# Patient Record
Sex: Male | Born: 1935 | Race: White | Hispanic: No | Marital: Married | Smoking: Former smoker
Health system: Southern US, Community
[De-identification: ages and names within clinical notes are randomized; demographics above are authoritative.]

## PROBLEM LIST (undated history)

## (undated) DIAGNOSIS — D649 Anemia, unspecified: Secondary | ICD-10-CM

## (undated) DIAGNOSIS — Z9889 Other specified postprocedural states: Secondary | ICD-10-CM

## (undated) DIAGNOSIS — F29 Unspecified psychosis not due to a substance or known physiological condition: Secondary | ICD-10-CM

## (undated) DIAGNOSIS — N400 Enlarged prostate without lower urinary tract symptoms: Secondary | ICD-10-CM

## (undated) DIAGNOSIS — E785 Hyperlipidemia, unspecified: Secondary | ICD-10-CM

## (undated) DIAGNOSIS — Z79899 Other long term (current) drug therapy: Secondary | ICD-10-CM

## (undated) DIAGNOSIS — I4891 Unspecified atrial fibrillation: Secondary | ICD-10-CM

## (undated) DIAGNOSIS — Z950 Presence of cardiac pacemaker: Secondary | ICD-10-CM

## (undated) DIAGNOSIS — Z9089 Acquired absence of other organs: Secondary | ICD-10-CM

## (undated) DIAGNOSIS — I10 Essential (primary) hypertension: Secondary | ICD-10-CM

## (undated) DIAGNOSIS — G459 Transient cerebral ischemic attack, unspecified: Secondary | ICD-10-CM

## (undated) DIAGNOSIS — C61 Malignant neoplasm of prostate: Secondary | ICD-10-CM

## (undated) DIAGNOSIS — K5730 Diverticulosis of large intestine without perforation or abscess without bleeding: Secondary | ICD-10-CM

## (undated) DIAGNOSIS — K649 Unspecified hemorrhoids: Secondary | ICD-10-CM

## (undated) DIAGNOSIS — D126 Benign neoplasm of colon, unspecified: Secondary | ICD-10-CM

## (undated) DIAGNOSIS — M79609 Pain in unspecified limb: Secondary | ICD-10-CM

## (undated) DIAGNOSIS — D473 Essential (hemorrhagic) thrombocythemia: Secondary | ICD-10-CM

## (undated) HISTORY — DX: Unspecified hemorrhoids: K64.9

## (undated) HISTORY — DX: Presence of cardiac pacemaker: Z95.0

## (undated) HISTORY — DX: Pain in unspecified limb: M79.609

## (undated) HISTORY — DX: Diverticulosis of large intestine without perforation or abscess without bleeding: K57.30

## (undated) HISTORY — PX: OTHER SURGICAL HISTORY: SHX169

## (undated) HISTORY — DX: Essential (primary) hypertension: I10

## (undated) HISTORY — PX: TONSILLECTOMY: SUR1361

## (undated) HISTORY — PX: TONSILLECTOMY AND ADENOIDECTOMY: SHX28

## (undated) HISTORY — DX: Benign neoplasm of colon, unspecified: D12.6

## (undated) HISTORY — DX: Other specified postprocedural states: Z98.890

## (undated) HISTORY — DX: Acquired absence of other organs: Z90.89

## (undated) HISTORY — DX: Hyperlipidemia, unspecified: E78.5

## (undated) HISTORY — DX: Other long term (current) drug therapy: Z79.899

## (undated) HISTORY — DX: Unspecified psychosis not due to a substance or known physiological condition: F29

## (undated) HISTORY — DX: Unspecified atrial fibrillation: I48.91

## (undated) HISTORY — DX: Benign prostatic hyperplasia without lower urinary tract symptoms: N40.0

## (undated) HISTORY — DX: Malignant neoplasm of prostate: C61

## (undated) HISTORY — PX: PACEMAKER PLACEMENT: SHX43

---

## 1998-01-27 ENCOUNTER — Observation Stay (HOSPITAL_COMMUNITY): Admission: AD | Admit: 1998-01-27 | Discharge: 1998-01-28 | Payer: Self-pay | Admitting: Internal Medicine

## 2002-11-14 ENCOUNTER — Ambulatory Visit (HOSPITAL_COMMUNITY): Admission: RE | Admit: 2002-11-14 | Discharge: 2002-11-14 | Payer: Self-pay | Admitting: Cardiology

## 2004-04-27 ENCOUNTER — Ambulatory Visit: Payer: Self-pay | Admitting: *Deleted

## 2004-05-13 ENCOUNTER — Ambulatory Visit: Payer: Self-pay

## 2004-05-23 ENCOUNTER — Ambulatory Visit: Payer: Self-pay | Admitting: Cardiology

## 2004-06-21 ENCOUNTER — Ambulatory Visit: Payer: Self-pay | Admitting: *Deleted

## 2004-06-23 ENCOUNTER — Ambulatory Visit: Payer: Self-pay

## 2004-07-12 ENCOUNTER — Ambulatory Visit: Payer: Self-pay

## 2004-07-19 ENCOUNTER — Ambulatory Visit: Payer: Self-pay | Admitting: Internal Medicine

## 2004-08-16 ENCOUNTER — Ambulatory Visit: Payer: Self-pay | Admitting: *Deleted

## 2004-09-09 ENCOUNTER — Ambulatory Visit: Payer: Self-pay | Admitting: Internal Medicine

## 2004-09-14 ENCOUNTER — Ambulatory Visit: Payer: Self-pay | Admitting: Cardiology

## 2004-10-11 ENCOUNTER — Ambulatory Visit: Payer: Self-pay | Admitting: Internal Medicine

## 2004-10-12 ENCOUNTER — Ambulatory Visit: Payer: Self-pay | Admitting: Cardiology

## 2004-11-08 ENCOUNTER — Ambulatory Visit: Payer: Self-pay | Admitting: Cardiology

## 2004-12-08 ENCOUNTER — Ambulatory Visit: Payer: Self-pay | Admitting: Cardiology

## 2004-12-14 ENCOUNTER — Ambulatory Visit: Payer: Self-pay | Admitting: Gastroenterology

## 2004-12-27 ENCOUNTER — Ambulatory Visit: Payer: Self-pay | Admitting: Internal Medicine

## 2005-01-09 ENCOUNTER — Ambulatory Visit: Payer: Self-pay | Admitting: Internal Medicine

## 2005-01-31 ENCOUNTER — Ambulatory Visit: Payer: Self-pay | Admitting: Gastroenterology

## 2005-01-31 LAB — HM COLONOSCOPY: HM Colonoscopy: ABNORMAL

## 2005-02-01 ENCOUNTER — Ambulatory Visit: Payer: Self-pay | Admitting: Internal Medicine

## 2005-02-06 ENCOUNTER — Ambulatory Visit: Payer: Self-pay | Admitting: Cardiology

## 2005-02-17 ENCOUNTER — Ambulatory Visit: Payer: Self-pay | Admitting: Cardiology

## 2005-03-16 ENCOUNTER — Ambulatory Visit: Payer: Self-pay | Admitting: Internal Medicine

## 2005-03-20 ENCOUNTER — Ambulatory Visit: Payer: Self-pay | Admitting: Cardiology

## 2005-04-14 ENCOUNTER — Ambulatory Visit: Payer: Self-pay | Admitting: Internal Medicine

## 2005-04-17 ENCOUNTER — Ambulatory Visit: Payer: Self-pay | Admitting: Internal Medicine

## 2005-05-18 ENCOUNTER — Ambulatory Visit: Payer: Self-pay | Admitting: Internal Medicine

## 2005-05-29 ENCOUNTER — Ambulatory Visit: Payer: Self-pay | Admitting: Cardiology

## 2005-06-15 ENCOUNTER — Ambulatory Visit: Payer: Self-pay | Admitting: *Deleted

## 2005-06-21 ENCOUNTER — Ambulatory Visit: Payer: Self-pay | Admitting: Internal Medicine

## 2005-07-17 ENCOUNTER — Ambulatory Visit: Payer: Self-pay | Admitting: Cardiology

## 2005-07-20 ENCOUNTER — Ambulatory Visit: Payer: Self-pay | Admitting: Internal Medicine

## 2005-08-17 ENCOUNTER — Ambulatory Visit: Payer: Self-pay | Admitting: Internal Medicine

## 2005-08-18 ENCOUNTER — Ambulatory Visit: Payer: Self-pay | Admitting: Internal Medicine

## 2005-08-23 ENCOUNTER — Ambulatory Visit: Payer: Self-pay | Admitting: *Deleted

## 2005-08-25 ENCOUNTER — Ambulatory Visit: Payer: Self-pay | Admitting: *Deleted

## 2005-09-11 ENCOUNTER — Ambulatory Visit: Payer: Self-pay | Admitting: Internal Medicine

## 2005-09-13 ENCOUNTER — Ambulatory Visit: Payer: Self-pay | Admitting: Cardiology

## 2005-09-18 ENCOUNTER — Ambulatory Visit: Payer: Self-pay | Admitting: Internal Medicine

## 2005-10-09 ENCOUNTER — Ambulatory Visit: Payer: Self-pay | Admitting: Cardiology

## 2005-12-04 ENCOUNTER — Ambulatory Visit: Payer: Self-pay | Admitting: Cardiology

## 2005-12-12 ENCOUNTER — Ambulatory Visit: Payer: Self-pay | Admitting: Internal Medicine

## 2005-12-22 ENCOUNTER — Ambulatory Visit: Payer: Self-pay | Admitting: Internal Medicine

## 2006-01-01 ENCOUNTER — Ambulatory Visit: Payer: Self-pay | Admitting: Cardiology

## 2006-01-15 ENCOUNTER — Ambulatory Visit: Payer: Self-pay | Admitting: Cardiovascular Disease

## 2006-02-02 ENCOUNTER — Ambulatory Visit: Payer: Self-pay | Admitting: Internal Medicine

## 2006-02-12 ENCOUNTER — Ambulatory Visit: Payer: Self-pay | Admitting: Cardiology

## 2006-02-27 ENCOUNTER — Ambulatory Visit: Payer: Self-pay | Admitting: *Deleted

## 2006-03-12 ENCOUNTER — Ambulatory Visit: Payer: Self-pay | Admitting: Cardiology

## 2006-03-12 ENCOUNTER — Ambulatory Visit: Payer: Self-pay | Admitting: *Deleted

## 2006-04-09 ENCOUNTER — Ambulatory Visit: Payer: Self-pay | Admitting: Cardiovascular Disease

## 2006-05-07 ENCOUNTER — Ambulatory Visit: Payer: Self-pay | Admitting: Cardiology

## 2006-05-07 ENCOUNTER — Ambulatory Visit: Payer: Self-pay | Admitting: Internal Medicine

## 2006-06-04 ENCOUNTER — Ambulatory Visit: Payer: Self-pay | Admitting: Internal Medicine

## 2006-06-04 ENCOUNTER — Ambulatory Visit: Payer: Self-pay | Admitting: Cardiology

## 2006-06-19 DIAGNOSIS — Z950 Presence of cardiac pacemaker: Secondary | ICD-10-CM

## 2006-06-19 HISTORY — DX: Presence of cardiac pacemaker: Z95.0

## 2006-06-25 ENCOUNTER — Ambulatory Visit: Payer: Self-pay | Admitting: Cardiology

## 2006-07-02 ENCOUNTER — Ambulatory Visit: Payer: Self-pay | Admitting: Internal Medicine

## 2006-07-23 ENCOUNTER — Ambulatory Visit: Payer: Self-pay | Admitting: Cardiology

## 2006-07-30 ENCOUNTER — Ambulatory Visit: Payer: Self-pay | Admitting: Internal Medicine

## 2006-08-06 ENCOUNTER — Ambulatory Visit: Payer: Self-pay | Admitting: Internal Medicine

## 2006-08-27 ENCOUNTER — Ambulatory Visit: Payer: Self-pay | Admitting: Internal Medicine

## 2006-09-03 ENCOUNTER — Ambulatory Visit: Payer: Self-pay | Admitting: Cardiology

## 2006-09-20 ENCOUNTER — Ambulatory Visit: Payer: Self-pay | Admitting: Internal Medicine

## 2006-10-01 ENCOUNTER — Ambulatory Visit: Payer: Self-pay | Admitting: Cardiology

## 2006-10-16 ENCOUNTER — Ambulatory Visit: Payer: Self-pay | Admitting: *Deleted

## 2006-10-22 ENCOUNTER — Ambulatory Visit: Payer: Self-pay | Admitting: Internal Medicine

## 2006-10-22 ENCOUNTER — Ambulatory Visit: Payer: Self-pay | Admitting: Cardiovascular Disease

## 2006-10-22 ENCOUNTER — Ambulatory Visit: Payer: Self-pay | Admitting: *Deleted

## 2006-10-22 LAB — CONVERTED CEMR LAB
Alkaline Phosphatase: 52 units/L (ref 39–117)
Bilirubin, Direct: 0.2 mg/dL (ref 0.0–0.3)
Cholesterol: 143 mg/dL (ref 0–200)
HDL: 59.9 mg/dL (ref >39.0)
LDL Cholesterol: 69 mg/dL (ref 0–99)
Total Bilirubin: 1.6 mg/dL — ABNORMAL HIGH (ref 0.3–1.2)
Total CHOL/HDL Ratio: 2.4
Total Protein: 6.7 g/dL (ref 6.0–8.3)
Triglycerides: 72 mg/dL (ref 0–149)

## 2006-11-19 ENCOUNTER — Ambulatory Visit: Payer: Self-pay | Admitting: Internal Medicine

## 2006-12-17 ENCOUNTER — Ambulatory Visit: Payer: Self-pay | Admitting: Internal Medicine

## 2006-12-17 ENCOUNTER — Ambulatory Visit: Payer: Self-pay | Admitting: Cardiology

## 2006-12-31 ENCOUNTER — Ambulatory Visit: Payer: Self-pay | Admitting: Internal Medicine

## 2007-01-14 ENCOUNTER — Ambulatory Visit: Payer: Self-pay | Admitting: Internal Medicine

## 2007-01-14 ENCOUNTER — Ambulatory Visit: Payer: Self-pay | Admitting: Cardiology

## 2007-02-11 ENCOUNTER — Ambulatory Visit: Payer: Self-pay | Admitting: Internal Medicine

## 2007-02-11 ENCOUNTER — Ambulatory Visit: Payer: Self-pay | Admitting: Cardiology

## 2007-03-09 ENCOUNTER — Ambulatory Visit: Payer: Self-pay | Admitting: Internal Medicine

## 2007-03-11 ENCOUNTER — Ambulatory Visit: Payer: Self-pay | Admitting: Cardiovascular Disease

## 2007-04-01 ENCOUNTER — Ambulatory Visit: Payer: Self-pay | Admitting: Internal Medicine

## 2007-04-08 ENCOUNTER — Ambulatory Visit: Payer: Self-pay | Admitting: Internal Medicine

## 2007-04-22 ENCOUNTER — Ambulatory Visit: Payer: Self-pay | Admitting: Cardiology

## 2007-05-06 ENCOUNTER — Ambulatory Visit: Payer: Self-pay | Admitting: Internal Medicine

## 2007-05-06 LAB — CONVERTED CEMR LAB
ALT: 20 units/L (ref 0–53)
AST: 25 units/L (ref 0–37)
Alkaline Phosphatase: 55 units/L (ref 39–117)
BUN: 23 mg/dL (ref 6–23)
Basophils Relative: 0.1 % (ref 0.0–1.0)
CO2: 31 meq/L (ref 19–32)
Calcium: 9.3 mg/dL (ref 8.4–10.5)
Chloride: 105 meq/L (ref 96–112)
Eosinophils Absolute: 0.2 10*3/uL (ref 0.0–0.6)
Eosinophils Relative: 3.6 % (ref 0.0–5.0)
GFR calc Af Amer: 93 mL/min
GFR calc non Af Amer: 77 mL/min
Lymphocytes Relative: 22.4 % (ref 12.0–46.0)
Monocytes Relative: 11.2 % — ABNORMAL HIGH (ref 3.0–11.0)
Neutro Abs: 4.1 10*3/uL (ref 1.4–7.7)
Platelets: 207 10*3/uL (ref 150–400)
Prothrombin Time: 15.5 s — ABNORMAL HIGH (ref 10.9–13.3)
RBC: 4.33 M/uL (ref 4.22–5.81)
WBC: 6.4 10*3/uL (ref 4.5–10.5)

## 2007-05-07 ENCOUNTER — Ambulatory Visit: Payer: Self-pay | Admitting: Internal Medicine

## 2007-05-07 ENCOUNTER — Ambulatory Visit (HOSPITAL_COMMUNITY): Admission: RE | Admit: 2007-05-07 | Discharge: 2007-05-07 | Payer: Self-pay | Admitting: Internal Medicine

## 2007-05-22 ENCOUNTER — Ambulatory Visit: Payer: Self-pay | Admitting: Internal Medicine

## 2007-05-22 ENCOUNTER — Ambulatory Visit: Payer: Self-pay

## 2007-06-05 ENCOUNTER — Ambulatory Visit: Payer: Self-pay | Admitting: Cardiology

## 2007-06-25 ENCOUNTER — Ambulatory Visit: Payer: Self-pay | Admitting: Cardiology

## 2007-07-23 ENCOUNTER — Ambulatory Visit: Payer: Self-pay | Admitting: Cardiology

## 2007-08-12 ENCOUNTER — Ambulatory Visit: Payer: Self-pay | Admitting: Internal Medicine

## 2007-08-20 ENCOUNTER — Ambulatory Visit: Payer: Self-pay | Admitting: Internal Medicine

## 2007-08-26 ENCOUNTER — Encounter: Payer: Self-pay | Admitting: Internal Medicine

## 2007-09-23 ENCOUNTER — Ambulatory Visit: Payer: Self-pay | Admitting: Internal Medicine

## 2007-10-21 ENCOUNTER — Encounter: Payer: Self-pay | Admitting: *Deleted

## 2007-10-21 ENCOUNTER — Ambulatory Visit: Payer: Self-pay | Admitting: Cardiovascular Disease

## 2007-10-21 DIAGNOSIS — D126 Benign neoplasm of colon, unspecified: Secondary | ICD-10-CM

## 2007-10-21 DIAGNOSIS — K649 Unspecified hemorrhoids: Secondary | ICD-10-CM | POA: Insufficient documentation

## 2007-10-21 DIAGNOSIS — I498 Other specified cardiac arrhythmias: Secondary | ICD-10-CM

## 2007-10-21 DIAGNOSIS — E785 Hyperlipidemia, unspecified: Secondary | ICD-10-CM

## 2007-10-21 DIAGNOSIS — K5730 Diverticulosis of large intestine without perforation or abscess without bleeding: Secondary | ICD-10-CM | POA: Insufficient documentation

## 2007-10-21 DIAGNOSIS — I10 Essential (primary) hypertension: Secondary | ICD-10-CM | POA: Insufficient documentation

## 2007-10-21 DIAGNOSIS — Z9089 Acquired absence of other organs: Secondary | ICD-10-CM

## 2007-10-21 DIAGNOSIS — Z87898 Personal history of other specified conditions: Secondary | ICD-10-CM

## 2007-11-18 ENCOUNTER — Ambulatory Visit: Payer: Self-pay | Admitting: Cardiology

## 2007-12-30 ENCOUNTER — Ambulatory Visit: Payer: Self-pay | Admitting: Cardiology

## 2008-01-15 ENCOUNTER — Ambulatory Visit: Payer: Self-pay | Admitting: Internal Medicine

## 2008-01-15 DIAGNOSIS — M79609 Pain in unspecified limb: Secondary | ICD-10-CM

## 2008-01-15 DIAGNOSIS — R7989 Other specified abnormal findings of blood chemistry: Secondary | ICD-10-CM | POA: Insufficient documentation

## 2008-01-15 LAB — CONVERTED CEMR LAB
BUN: 25 mg/dL — ABNORMAL HIGH (ref 6–23)
CO2: 30 meq/L (ref 19–32)
Calcium: 9 mg/dL (ref 8.4–10.5)
Chloride: 106 meq/L (ref 96–112)
Creatinine, Ser: 1 mg/dL (ref 0.4–1.5)
GFR calc Af Amer: 93 mL/min
GFR calc non Af Amer: 77 mL/min
Glucose, Bld: 106 mg/dL — ABNORMAL HIGH (ref 70–99)
Hgb A1c MFr Bld: 6 % (ref 4.6–6.0)
Potassium: 4.5 meq/L (ref 3.5–5.1)
Sed Rate: 8 mm/hr (ref 0–16)
Sodium: 141 meq/L (ref 135–145)
Total CK: 133 units/L (ref 7–195)

## 2008-01-17 ENCOUNTER — Encounter: Payer: Self-pay | Admitting: Internal Medicine

## 2008-01-27 ENCOUNTER — Ambulatory Visit: Payer: Self-pay | Admitting: Cardiology

## 2008-02-25 ENCOUNTER — Ambulatory Visit: Payer: Self-pay | Admitting: Cardiology

## 2008-03-23 ENCOUNTER — Ambulatory Visit: Payer: Self-pay | Admitting: Internal Medicine

## 2008-04-21 ENCOUNTER — Ambulatory Visit: Payer: Self-pay | Admitting: Internal Medicine

## 2008-05-21 ENCOUNTER — Ambulatory Visit: Payer: Self-pay | Admitting: Cardiology

## 2008-05-25 ENCOUNTER — Ambulatory Visit: Payer: Self-pay | Admitting: Internal Medicine

## 2008-06-22 ENCOUNTER — Ambulatory Visit: Payer: Self-pay | Admitting: Internal Medicine

## 2008-07-07 ENCOUNTER — Ambulatory Visit: Payer: Self-pay | Admitting: Internal Medicine

## 2008-07-09 DIAGNOSIS — I4891 Unspecified atrial fibrillation: Secondary | ICD-10-CM

## 2008-07-27 ENCOUNTER — Ambulatory Visit: Payer: Self-pay | Admitting: Cardiology

## 2008-08-24 ENCOUNTER — Ambulatory Visit: Payer: Self-pay | Admitting: Cardiology

## 2008-08-25 ENCOUNTER — Ambulatory Visit: Payer: Self-pay | Admitting: Cardiology

## 2008-08-25 LAB — CONVERTED CEMR LAB
ALT: 22 units/L (ref 0–53)
Albumin: 4.1 g/dL (ref 3.5–5.2)
Cholesterol: 156 mg/dL (ref 0–200)
HDL: 59.2 mg/dL (ref >39.0)
Total Protein: 6.6 g/dL (ref 6.0–8.3)
Triglycerides: 43 mg/dL (ref 0–149)
VLDL: 9 mg/dL (ref 0–40)

## 2008-09-02 ENCOUNTER — Encounter: Payer: Self-pay | Admitting: Internal Medicine

## 2008-09-21 ENCOUNTER — Ambulatory Visit: Payer: Self-pay | Admitting: Cardiology

## 2008-09-25 ENCOUNTER — Encounter (INDEPENDENT_AMBULATORY_CARE_PROVIDER_SITE_OTHER): Payer: Self-pay

## 2008-10-05 ENCOUNTER — Telehealth (INDEPENDENT_AMBULATORY_CARE_PROVIDER_SITE_OTHER): Payer: Self-pay | Admitting: *Deleted

## 2008-10-12 ENCOUNTER — Ambulatory Visit: Payer: Self-pay | Admitting: Internal Medicine

## 2008-10-19 ENCOUNTER — Ambulatory Visit: Payer: Self-pay | Admitting: Internal Medicine

## 2008-10-20 ENCOUNTER — Telehealth: Payer: Self-pay | Admitting: Internal Medicine

## 2008-10-21 LAB — CONVERTED CEMR LAB
ALT: 14 U/L
AST: 27 U/L
Albumin: 4.1 g/dL
Alkaline Phosphatase: 55 U/L
Bilirubin, Direct: 0.3 mg/dL
Cholesterol: 180 mg/dL
HDL: 57.8 mg/dL
LDL Cholesterol: 111 mg/dL — ABNORMAL HIGH
Total Bilirubin: 1.5 mg/dL — ABNORMAL HIGH
Total CHOL/HDL Ratio: 3
Total Protein: 6.6 g/dL
Triglycerides: 57 mg/dL
VLDL: 11.4 mg/dL

## 2008-11-04 DIAGNOSIS — Z950 Presence of cardiac pacemaker: Secondary | ICD-10-CM | POA: Insufficient documentation

## 2008-11-17 ENCOUNTER — Ambulatory Visit: Payer: Self-pay | Admitting: Internal Medicine

## 2008-11-17 ENCOUNTER — Ambulatory Visit: Payer: Self-pay | Admitting: Cardiovascular Disease

## 2008-11-17 ENCOUNTER — Encounter: Payer: Self-pay | Admitting: *Deleted

## 2008-12-10 ENCOUNTER — Telehealth: Payer: Self-pay | Admitting: Internal Medicine

## 2008-12-15 ENCOUNTER — Ambulatory Visit: Payer: Self-pay | Admitting: Cardiology

## 2008-12-15 ENCOUNTER — Ambulatory Visit: Payer: Self-pay | Admitting: Internal Medicine

## 2008-12-15 ENCOUNTER — Encounter: Payer: Self-pay | Admitting: Internal Medicine

## 2008-12-15 ENCOUNTER — Encounter (INDEPENDENT_AMBULATORY_CARE_PROVIDER_SITE_OTHER): Payer: Self-pay | Admitting: Cardiology

## 2008-12-15 LAB — CONVERTED CEMR LAB: Prothrombin Time: 12.9 s

## 2008-12-17 ENCOUNTER — Encounter: Payer: Self-pay | Admitting: Internal Medicine

## 2008-12-17 LAB — CONVERTED CEMR LAB
ALT: 15 units/L (ref 0–53)
HDL: 64.2 mg/dL (ref >39.00)
Total Bilirubin: 1.8 mg/dL — ABNORMAL HIGH (ref 0.3–1.2)
Total Protein: 6.7 g/dL (ref 6.0–8.3)
VLDL: 11.8 mg/dL (ref 0.0–40.0)

## 2008-12-22 ENCOUNTER — Telehealth: Payer: Self-pay | Admitting: Internal Medicine

## 2008-12-23 ENCOUNTER — Encounter: Payer: Self-pay | Admitting: *Deleted

## 2008-12-25 ENCOUNTER — Ambulatory Visit: Payer: Self-pay | Admitting: Cardiology

## 2008-12-25 LAB — CONVERTED CEMR LAB
POC INR: 1.9
Prothrombin Time: 17 s

## 2009-01-11 ENCOUNTER — Ambulatory Visit: Payer: Self-pay | Admitting: Cardiology

## 2009-02-08 ENCOUNTER — Ambulatory Visit: Payer: Self-pay | Admitting: Cardiology

## 2009-02-08 LAB — CONVERTED CEMR LAB: POC INR: 2.9

## 2009-03-08 ENCOUNTER — Ambulatory Visit: Payer: Self-pay | Admitting: Cardiology

## 2009-03-08 LAB — CONVERTED CEMR LAB: POC INR: 3

## 2009-03-15 ENCOUNTER — Ambulatory Visit: Payer: Self-pay | Admitting: Internal Medicine

## 2009-04-05 ENCOUNTER — Ambulatory Visit: Payer: Self-pay | Admitting: Internal Medicine

## 2009-05-03 ENCOUNTER — Ambulatory Visit: Payer: Self-pay | Admitting: Cardiology

## 2009-05-31 ENCOUNTER — Ambulatory Visit: Payer: Self-pay | Admitting: Cardiology

## 2009-05-31 ENCOUNTER — Encounter (INDEPENDENT_AMBULATORY_CARE_PROVIDER_SITE_OTHER): Payer: Self-pay | Admitting: Cardiology

## 2009-06-14 ENCOUNTER — Ambulatory Visit: Payer: Self-pay | Admitting: Internal Medicine

## 2009-07-05 ENCOUNTER — Ambulatory Visit: Payer: Self-pay | Admitting: Cardiology

## 2009-08-09 ENCOUNTER — Ambulatory Visit: Payer: Self-pay | Admitting: Internal Medicine

## 2009-08-09 DIAGNOSIS — J069 Acute upper respiratory infection, unspecified: Secondary | ICD-10-CM | POA: Insufficient documentation

## 2009-08-12 ENCOUNTER — Ambulatory Visit: Payer: Self-pay | Admitting: Cardiology

## 2009-09-13 ENCOUNTER — Ambulatory Visit: Payer: Self-pay | Admitting: Cardiovascular Disease

## 2009-09-13 ENCOUNTER — Ambulatory Visit: Payer: Self-pay | Admitting: Internal Medicine

## 2009-09-13 LAB — CONVERTED CEMR LAB: POC INR: 2.7

## 2009-09-14 ENCOUNTER — Encounter: Payer: Self-pay | Admitting: Internal Medicine

## 2009-10-13 ENCOUNTER — Ambulatory Visit: Payer: Self-pay | Admitting: Internal Medicine

## 2009-10-13 LAB — CONVERTED CEMR LAB: POC INR: 3.3

## 2009-11-09 ENCOUNTER — Ambulatory Visit: Payer: Self-pay | Admitting: Cardiology

## 2009-11-09 LAB — CONVERTED CEMR LAB: POC INR: 3.2

## 2009-11-23 ENCOUNTER — Ambulatory Visit: Payer: Self-pay | Admitting: Cardiovascular Disease

## 2009-12-09 ENCOUNTER — Ambulatory Visit: Payer: Self-pay | Admitting: Cardiology

## 2009-12-09 LAB — CONVERTED CEMR LAB: POC INR: 2.4

## 2009-12-14 ENCOUNTER — Ambulatory Visit: Payer: Self-pay | Admitting: Internal Medicine

## 2009-12-17 ENCOUNTER — Encounter: Payer: Self-pay | Admitting: Internal Medicine

## 2009-12-22 ENCOUNTER — Ambulatory Visit: Payer: Self-pay | Admitting: Internal Medicine

## 2009-12-22 LAB — CONVERTED CEMR LAB: POC INR: 1.1

## 2009-12-23 ENCOUNTER — Encounter: Payer: Self-pay | Admitting: Internal Medicine

## 2009-12-24 ENCOUNTER — Encounter: Payer: Self-pay | Admitting: Internal Medicine

## 2009-12-30 ENCOUNTER — Ambulatory Visit: Payer: Self-pay | Admitting: Cardiology

## 2009-12-31 ENCOUNTER — Ambulatory Visit: Admission: RE | Admit: 2009-12-31 | Discharge: 2010-03-31 | Payer: Self-pay | Admitting: Radiation Oncology

## 2010-01-03 ENCOUNTER — Encounter: Payer: Self-pay | Admitting: Internal Medicine

## 2010-01-10 ENCOUNTER — Ambulatory Visit: Payer: Self-pay | Admitting: Cardiology

## 2010-01-11 ENCOUNTER — Encounter: Payer: Self-pay | Admitting: Internal Medicine

## 2010-01-17 ENCOUNTER — Encounter (INDEPENDENT_AMBULATORY_CARE_PROVIDER_SITE_OTHER): Payer: Self-pay | Admitting: *Deleted

## 2010-01-31 ENCOUNTER — Ambulatory Visit: Payer: Self-pay | Admitting: Cardiology

## 2010-02-01 ENCOUNTER — Telehealth: Payer: Self-pay | Admitting: Internal Medicine

## 2010-02-11 ENCOUNTER — Encounter: Payer: Self-pay | Admitting: Internal Medicine

## 2010-02-22 ENCOUNTER — Ambulatory Visit: Payer: Self-pay | Admitting: Internal Medicine

## 2010-02-22 LAB — CONVERTED CEMR LAB: POC INR: 2.4

## 2010-02-24 ENCOUNTER — Ambulatory Visit: Payer: Self-pay | Admitting: Internal Medicine

## 2010-02-24 DIAGNOSIS — F0390 Unspecified dementia without behavioral disturbance: Secondary | ICD-10-CM | POA: Insufficient documentation

## 2010-02-24 LAB — CONVERTED CEMR LAB
Chloride: 104 meq/L (ref 96–112)
Creatinine, Ser: 1 mg/dL (ref 0.4–1.5)
Folate: 7.7 ng/mL
GFR calc non Af Amer: 73.11 mL/min (ref >60)
Potassium: 4.5 meq/L (ref 3.5–5.1)
TSH: 2.1 microintl units/mL (ref 0.35–5.50)
Vitamin B-12: 478 pg/mL (ref 211–911)

## 2010-03-03 ENCOUNTER — Ambulatory Visit: Payer: Self-pay | Admitting: Cardiology

## 2010-03-07 ENCOUNTER — Ambulatory Visit: Payer: Self-pay | Admitting: Cardiovascular Disease

## 2010-03-07 ENCOUNTER — Telehealth: Payer: Self-pay | Admitting: Internal Medicine

## 2010-03-07 LAB — CONVERTED CEMR LAB: POC INR: 2.9

## 2010-03-15 ENCOUNTER — Ambulatory Visit: Payer: Self-pay | Admitting: Internal Medicine

## 2010-03-18 ENCOUNTER — Encounter (INDEPENDENT_AMBULATORY_CARE_PROVIDER_SITE_OTHER): Payer: Self-pay | Admitting: *Deleted

## 2010-04-01 ENCOUNTER — Ambulatory Visit: Admission: RE | Admit: 2010-04-01 | Discharge: 2010-04-25 | Payer: Self-pay | Admitting: Radiation Oncology

## 2010-04-04 ENCOUNTER — Ambulatory Visit: Payer: Self-pay | Admitting: Cardiology

## 2010-04-04 LAB — CONVERTED CEMR LAB: POC INR: 2.7

## 2010-04-07 ENCOUNTER — Encounter: Payer: Self-pay | Admitting: Internal Medicine

## 2010-05-06 ENCOUNTER — Ambulatory Visit: Payer: Self-pay | Admitting: Cardiovascular Disease

## 2010-05-15 ENCOUNTER — Encounter: Payer: Self-pay | Admitting: Internal Medicine

## 2010-06-08 ENCOUNTER — Ambulatory Visit: Payer: Self-pay | Admitting: Internal Medicine

## 2010-06-08 LAB — CONVERTED CEMR LAB: POC INR: 2.2

## 2010-06-14 ENCOUNTER — Encounter: Payer: Self-pay | Admitting: Internal Medicine

## 2010-06-22 ENCOUNTER — Ambulatory Visit: Payer: Self-pay | Admitting: Internal Medicine

## 2010-06-27 ENCOUNTER — Ambulatory Visit
Admission: RE | Admit: 2010-06-27 | Discharge: 2010-06-27 | Payer: Self-pay | Source: Home / Self Care | Attending: Internal Medicine | Admitting: Internal Medicine

## 2010-06-27 ENCOUNTER — Encounter: Payer: Self-pay | Admitting: Internal Medicine

## 2010-06-27 DIAGNOSIS — C61 Malignant neoplasm of prostate: Secondary | ICD-10-CM | POA: Insufficient documentation

## 2010-07-05 ENCOUNTER — Encounter: Payer: Self-pay | Admitting: Internal Medicine

## 2010-07-05 ENCOUNTER — Ambulatory Visit: Admission: RE | Admit: 2010-07-05 | Discharge: 2010-07-05 | Payer: Self-pay | Source: Home / Self Care

## 2010-07-05 ENCOUNTER — Ambulatory Visit
Admission: RE | Admit: 2010-07-05 | Discharge: 2010-07-05 | Payer: Self-pay | Source: Home / Self Care | Attending: Internal Medicine | Admitting: Internal Medicine

## 2010-07-19 NOTE — Letter (Signed)
[facility]: [facility]   Imported By: Sherian Rein 09/21/2009 10:00:59  _____________________________________________________________________  External Attachment:    Type:   Image     Comment:   External Document

## 2010-07-19 NOTE — Letter (Signed)
Summary: [facility]   Imported By: Sherian Rein 02/18/2010 08:04:30  _____________________________________________________________________  External Attachment:    Type:   Image     Comment:   External Document

## 2010-07-19 NOTE — Medication Information (Signed)
Summary: rov/ewj  Anticoagulant Therapy  Managed by: Cloyde Reams, RN, BSN Referring MD: Rollene Rotunda MD PCP: Link Snuffer MD: Tenny Craw MD, Gunnar Fusi Indication 1: Atrial Fibrillation (ICD-427.31) Lab Used: [facility] Site: [facility] INR POC 2.7 INR RANGE 2 - 3  Dietary changes: no    Health status changes: no    Bleeding/hemorrhagic complications: no    Recent/future hospitalizations: no    Any changes in medication regimen? no    Recent/future dental: no  Any missed doses?: no       Is patient compliant with meds? yes       Allergies (verified): No Known Drug Allergies  Anticoagulation Management History:      The patient is taking warfarin and comes in today for a routine follow up visit.  Positive risk factors for bleeding include an age of 75 years or older.  The bleeding index is 'intermediate risk'.  Positive CHADS2 values include History of HTN and Age > 4 years old.  The start date was 10/08/1997.  His last INR was 1.6 RATIO.  Anticoagulation responsible provider: Tenny Craw MD, Gunnar Fusi.  INR POC: 2.7.  Cuvette Lot#: [id number].  Exp: 10/2010.    Anticoagulation Management Assessment/Plan:      The patient's current anticoagulation dose is Coumadin 5 mg tabs: Take as directed by coumadin clinic..  The target INR is 2 - 3.  The next INR is due 10/13/2009.  Anticoagulation instructions were given to patient.  Results were reviewed/authorized by Cloyde Reams, RN, BSN.  He was notified by Cloyde Reams RN.         Prior Anticoagulation Instructions: INR 2.2  Continue on same dosage 1 tablet daily.  Recheck in 4 weeks.    Current Anticoagulation Instructions: INR 2.7  Continue on same dosage 5mg  daily.  Recheck in 4 weeks.

## 2010-07-19 NOTE — Cardiovascular Report (Signed)
Summary: Certified Letter Signed - Other  Certified Letter Signed - Other   Imported By: Debby Freiberg 04/26/2010 12:50:58  _____________________________________________________________________  External Attachment:    Type:   Image     Comment:   External Document

## 2010-07-19 NOTE — Cardiovascular Report (Signed)
Summary: TTM   TTM   Imported By: Roderic Ovens 07/01/2009 10:51:49  _____________________________________________________________________  External Attachment:    Type:   Image     Comment:   External Document

## 2010-07-19 NOTE — Medication Information (Signed)
Summary: rov/cs  Anticoagulant Therapy  Managed by: Eda Keys, PharmD Referring MD: Rollene Rotunda MD PCP: Link Snuffer MD: Nahser Indication 1: Atrial Fibrillation (ICD-427.31) Lab Used: [facility] Site: [facility] INR POC 2.1 INR RANGE 2 - 3  Dietary changes: no    Health status changes: no    Bleeding/hemorrhagic complications: no    Recent/future hospitalizations: no    Any changes in medication regimen? no    Recent/future dental: no  Any missed doses?: no       Is patient compliant with meds? yes       Allergies: No Known Drug Allergies  Anticoagulation Management History:      The patient is taking warfarin and comes in today for a routine follow up visit.  Positive risk factors for bleeding include an age of 75 years or older.  The bleeding index is 'intermediate risk'.  Positive CHADS2 values include History of HTN and Age > 29 years old.  The start date was 10/08/1997.  His last INR was 1.6 RATIO.  Anticoagulation responsible Kemarion Abbey: Nahser.  INR POC: 2.1.  Cuvette Lot#: [id number].  Exp: 05/2011.    Anticoagulation Management Assessment/Plan:      The patient's current anticoagulation dose is Coumadin 5 mg tabs: Take as directed by coumadin clinic..  The target INR is 2 - 3.  The next INR is due 06/03/2010.  Anticoagulation instructions were given to patient.  Results were reviewed/authorized by Eda Keys, PharmD.  He was notified by Eda Keys.         Prior Anticoagulation Instructions: INR 2.7  Continue Coumadin as scheduled:  1 tablet every day of the week.  Return to clinic in 4 weeks.   Current Anticoagulation Instructions: INR 2.1  Continue taking 1 tablet every day.  Return to clinic in 4 weeks.

## 2010-07-19 NOTE — Letter (Signed)
Summary: [facility]  [facility]   Imported By: Sherian Rein 05/20/2010 08:02:54  _____________________________________________________________________  External Attachment:    Type:   Image     Comment:   External Document

## 2010-07-19 NOTE — Medication Information (Signed)
Summary: rov/ewj  Anticoagulant Therapy  Managed by: Cloyde Reams, RN, BSN Referring MD: Rollene Rotunda MD PCP: Link Snuffer MD: Shirlee Latch MD, Dalton Indication 1: Atrial Fibrillation (ICD-427.31) Lab Used: [facility] Site: [facility] INR POC 1.2 INR RANGE 2 - 3  Dietary changes: no    Health status changes: no    Bleeding/hemorrhagic complications: no    Recent/future hospitalizations: no    Any changes in medication regimen? no    Recent/future dental: no  Any missed doses?: yes     Details: Off coumadin for a procedure, resumed coumadin 1 week ago.    Is patient compliant with meds? yes       Allergies: No Known Drug Allergies  Anticoagulation Management History:      The patient is taking warfarin and comes in today for a routine follow up visit.  Positive risk factors for bleeding include an age of 75 years or older.  The bleeding index is 'intermediate risk'.  Positive CHADS2 values include History of HTN and Age > 33 years old.  The start date was 10/08/1997.  His last INR was 1.6 RATIO.  Anticoagulation responsible provider: Shirlee Latch MD, Dalton.  INR POC: 1.2.  Cuvette Lot#: [id number].  Exp: 02/2011.    Anticoagulation Management Assessment/Plan:      The patient's current anticoagulation dose is Coumadin 5 mg tabs: Take as directed by coumadin clinic..  The target INR is 2 - 3.  The next INR is due 01/06/2010.  Anticoagulation instructions were given to patient.  Results were reviewed/authorized by Cloyde Reams, RN, BSN.  He was notified by Cloyde Reams RN.         Prior Anticoagulation Instructions: INR 1.1   Resume coumadin per Dr instructions after procedure 5mg  daily.  Recheck in 1 week.    Current Anticoagulation Instructions: INR 1.2  Take 1.5 tablets x 2 doses, then resume same doage 1 tablet daily. Recheck in 1 week.

## 2010-07-19 NOTE — Assessment & Plan Note (Signed)
Summary: SORE THROAT/ACHEY/NO FEVER/CD   Vital Signs:  Patient profile:   77 year old male Height:      77 inches Weight:      200 pounds BMI:     23.80 O2 Sat:      98 % on Room air Temp:     99.9 degrees F oral Pulse rate:   75 / minute BP sitting:   122 / 70  (left arm) Cuff size:   large  Vitals Entered By: Bill Salinas CMA (August 09, 2009 10:44 AM)  O2 Flow:  Room air CC: pt here with complaint of weakness, fatigue, sore throat coughing, sneezing with fever x 3 days/ ab   Primary Care Provider:  Koby Pickup  CC:  pt here with complaint of weakness, fatigue, sore throat coughing, and sneezing with fever x 3 days/ ab.  History of Present Illness: Patient presents for sore throat and fatigue. He has not been seen since July '09. IN the interval he has followed closely with coag clinic; he has folowed with Dr. Graciela Husbands for management of his pacemaker - having had redo procedure; he has followed with Dr. Annabell Howells with las vist in Feb '10 at which time he was stale with an age related increase in PSA. Last lipid profile from June '10 was great with good control.   For the first time he has been troubled by sneezing, weakness and loss of energy over the past 6 days. He may have contracted an acute illness from a grand-daughter. He denies documented fever at home but has felt feverish. He is getting a lot of mucus drainage. He is mildly short of breath. NO N/V,  has had loose stools.    Current Medications (verified): 1)  Atenolol 50 Mg  Tabs (Atenolol) .... Take Once Daily 2)  Zocor 5 Mg Tabs (Simvastatin) .... Take 1 Tablet By Mouth 2 Days Per Week 3)  Coumadin 5 Mg Tabs (Warfarin Sodium) .... Take As Directed By Coumadin Clinic.  Allergies (verified): No Known Drug Allergies  Past History:  Past Medical History: Last updated: 07/09/2008 BRADYCARDIA (ICD-427.89) HYPERTENSION (ICD-401.9) Hx of POLYP, COLON (ICD-211.3) BENIGN PROSTATIC HYPERTROPHY, HX OF (ICD-V13.8) HYPERLIPIDEMIA  (ICD-272.4) ATRIAL FIBRILLATION, CHRONIC, HX OF (ICD-V12.59) Hx of HEMORRHOIDS (ICD-455.6) Hx of DIVERTICULOSIS, COLON (ICD-562.10) Previously implanted pacemaker Guidant      Past Surgical History: Last updated: 10/21/2007 PACEMAKER, PERMANENT (ICD-V45.01) * Hx of VEIN STRIPPING LASER PROCEDURE TONSILLECTOMY AND ADENOIDECTOMY, HX OF (ICD-V45.79)  Family History: Last updated: 07/09/2008 Negative FH of Diabetes, Hypertension, or Coronary Artery Disease  Social History: [place] - '45 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  Tobacco Use - No.   Review of Systems       The patient complains of fever and dyspnea on exertion.  The patient denies anorexia, weight loss, weight gain, hoarseness, chest pain, syncope, prolonged cough, abdominal pain, muscle weakness, difficulty walking, abnormal bleeding, and enlarged lymph nodes.    Physical Exam  General:  alert, well-developed, and well-nourished.   Head:  normocephalic and atraumatic.  No marked sinus tenderness Eyes:  vision grossly intact, pupils equal, and pupils round.   Ears:  R ear normal and L ear normal.   Nose:  no external deformity and no nasal discharge.   Mouth:  Oral mucosa and oropharynx without lesions or  exudates.  Teeth in good repair. Neck:  full ROM.   Lungs:  normal respiratory effort, normal breath sounds, no crackles, and no wheezes.   Heart:  normal rate and regular rhythm.   Abdomen:  normal bowel sounds.   Msk:  normal ROM, no joint tenderness, no joint swelling, and no joint warmth.   Neurologic:  alert & oriented X3, cranial nerves II-XII intact, strength normal in all extremities, sensation intact to light touch, and gait normal.   Skin:  turgor normal and color normal.   Cervical Nodes:  no anterior cervical adenopathy and no  posterior cervical adenopathy.   Psych:  Oriented X3, normally interactive, and good eye contact.  Poor memory.   Impression & Recommendations:  Problem # 1:  URI (ICD-465.9) Patient with a simple URI without evidence of pneumonia.   Plan - azithromycin 500mg  once daily x 3          supportive care  Complete Medication List: 1)  Atenolol 50 Mg Tabs (Atenolol) .... Take once daily 2)  Zocor 5 Mg Tabs (Simvastatin) .... Take 1 tablet by mouth 2 days per week 3)  Coumadin 5 Mg Tabs (Warfarin sodium) .... Take as directed by coumadin clinic. 4)  Azithromycin 500 Mg Tabs (Azithromycin) .Marland Kitchen.. 1 by mouth once daily x 3 days  Patient Instructions: 1)  Upper respiratory infection with fever:  Exam reveals clear lungs, no heart related changes. plan - azithromycin (antibiotic) for 3 days (provides 10 days of antibiotic coverage); take over the counter loratadine once a day for the drainage; take tylenol 500mg  two tablets three times a day for fever and aches; drink a lot of fluids; vitamin C 1000mg  daily. Prescriptions: AZITHROMYCIN 500 MG TABS (AZITHROMYCIN) 1 by mouth once daily x 3 days  #3 x 0   Entered and Authorized by:   Jacques Navy MD   Signed by:   Lamar Sprinkles, CMA on 08/09/2009   Method used:   Electronically to        [facility]  [phone]* (retail)       [address]       [place], [place]  [address]       Ph: [phone] or [phone]       Fax: [phone]   RxID:   [id number]      Immunization History:  Zostavax History:    Zostavax # 1:  zostavax (07/21/1999)  Not Administered:    Influenza Vaccine not given due to: declined

## 2010-07-19 NOTE — Medication Information (Signed)
Summary: rov/tm  Anticoagulant Therapy  Managed by: Weston Brass, PharmD Referring MD: Rollene Rotunda MD PCP: Link Snuffer MD: Myrtis Ser MD, Tinnie Gens Indication 1: Atrial Fibrillation (ICD-427.31) Lab Used: [facility] Site: [facility] INR POC 2.1 INR RANGE 2 - 3  Dietary changes: no    Health status changes: no     Recent/future hospitalizations: yes       Details: Will be having prostate biopsy on Aug. 26; need to come off Coumadin Aug. 21.  Will send Dr. Graciela Husbands a note for clearance.   Any changes in medication regimen? no    Recent/future dental: no  Any missed doses?: no       Is patient compliant with meds? yes       Allergies: No Known Drug Allergies  Anticoagulation Management History:      The patient is taking warfarin and comes in today for a routine follow up visit.  Positive risk factors for bleeding include an age of 75 years or older.  The bleeding index is 'intermediate risk'.  Positive CHADS2 values include History of HTN and Age > 54 years old.  The start date was 10/08/1997.  His last INR was 1.6 RATIO.  Anticoagulation responsible Kammie Scioli: Myrtis Ser MD, Tinnie Gens.  INR POC: 2.1.  Cuvette Lot#: [id number].  Exp: 03/2011.    Anticoagulation Management Assessment/Plan:      The patient's current anticoagulation dose is Coumadin 5 mg tabs: Take as directed by coumadin clinic..  The target INR is 2 - 3.  The next INR is due 02/18/2010.  Anticoagulation instructions were given to patient.  Results were reviewed/authorized by Weston Brass, PharmD.  He was notified by Liana Gerold, PharmD Candidate.         Prior Anticoagulation Instructions: INR 2.2 Continue 1 pill everyday.  Recheck in 3 weeks.   Current Anticoagulation Instructions: INR 2.1  Continue same dose of 1 tablet daily.  Will f/u with directions for procedure once cleared by Dr. Graciela Husbands.

## 2010-07-19 NOTE — Cardiovascular Report (Signed)
Summary: TTM   TTM   Imported By: Roderic Ovens 04/15/2010 15:18:10  _____________________________________________________________________  External Attachment:    Type:   Image     Comment:   External Document

## 2010-07-19 NOTE — Cardiovascular Report (Signed)
Summary: TTM  TTM   Imported By: Roderic Ovens 09/21/2009 16:04:42  _____________________________________________________________________  External Attachment:    Type:   Image     Comment:   External Document

## 2010-07-19 NOTE — Medication Information (Signed)
Summary: rov/ewj  Anticoagulant Therapy  Managed by: Weston Brass, PharmD Referring MD: Rollene Rotunda MD PCP: Link Snuffer MD: Eden Emms MD, Theron Arista Indication 1: Atrial Fibrillation (ICD-427.31) Lab Used: [facility] Site: [facility] INR POC 3.3 INR RANGE 2 - 3  Dietary changes: yes       Details: missed a few salads  Health status changes: no    Bleeding/hemorrhagic complications: no    Recent/future hospitalizations: no    Any changes in medication regimen? no    Recent/future dental: no  Any missed doses?: no       Is patient compliant with meds? yes       Allergies: No Known Drug Allergies  Anticoagulation Management History:      The patient is taking warfarin and comes in today for a routine follow up visit.  Positive risk factors for bleeding include an age of 75 years or older.  The bleeding index is 'intermediate risk'.  Positive CHADS2 values include History of HTN and Age > 35 years old.  The start date was 10/08/1997.  His last INR was 1.6 RATIO.  Anticoagulation responsible provider: Eden Emms MD, Theron Arista.  INR POC: 3.3.  Cuvette Lot#: [id number].  Exp: 11/2010.    Anticoagulation Management Assessment/Plan:      The patient's current anticoagulation dose is Coumadin 5 mg tabs: Take as directed by coumadin clinic..  The target INR is 2 - 3.  The next INR is due 11/08/2009.  Anticoagulation instructions were given to patient.  Results were reviewed/authorized by Weston Brass, PharmD.  He was notified by Weston Brass PharmD.         Prior Anticoagulation Instructions: INR 2.7  Continue on same dosage 5mg  daily.  Recheck in 4 weeks.    Current Anticoagulation Instructions: INR 3.3  Skip today's dose of Coumadin then resume same dose of 1 tablet every day

## 2010-07-19 NOTE — Medication Information (Signed)
Summary: ro.vmp  Anticoagulant Therapy  Managed by: Cloyde Reams, RN, BSN Referring MD: Rollene Rotunda MD PCP: Link Snuffer MD: Antoine Poche MD, Fayrene Fearing Indication 1: Atrial Fibrillation (ICD-427.31) Lab Used: [facility] Site: [facility] INR POC 2.2 INR RANGE 2 - 3  Dietary changes: no    Health status changes: no    Bleeding/hemorrhagic complications: no    Recent/future hospitalizations: no    Any changes in medication regimen? no    Recent/future dental: no  Any missed doses?: no       Is patient compliant with meds? yes       Allergies (verified): No Known Drug Allergies  Anticoagulation Management History:      The patient is taking warfarin and comes in today for a routine follow up visit.  Positive risk factors for bleeding include an age of 59 years or older.  The bleeding index is 'intermediate risk'.  Positive CHADS2 values include History of HTN and Age > 65 years old.  The start date was 10/08/1997.  His last INR was 1.6 RATIO.  Anticoagulation responsible provider: Antoine Poche MD, Fayrene Fearing.  INR POC: 2.2.  Cuvette Lot#: [id number].  Exp: 09/2010.    Anticoagulation Management Assessment/Plan:      The patient's current anticoagulation dose is Coumadin 5 mg tabs: Take as directed by coumadin clinic..  The target INR is 2 - 3.  The next INR is due 09/13/2009.  Anticoagulation instructions were given to patient.  Results were reviewed/authorized by Cloyde Reams, RN, BSN.  He was notified by Cloyde Reams RN.         Prior Anticoagulation Instructions: INR 2.4  Continue same dose of 1 tablet daily. Recheck in 4 weeks.  Current Anticoagulation Instructions: INR 2.2  Continue on same dosage 1 tablet daily.  Recheck in 4 weeks.

## 2010-07-19 NOTE — Medication Information (Signed)
Summary: rov/tm  Anticoagulant Therapy  Managed by: Cloyde Reams, RN, BSN Referring MD: Rollene Rotunda MD PCP: Link Snuffer MD: Shirlee Latch MD, Navina Wohlers Indication 1: Atrial Fibrillation (ICD-427.31) Lab Used: [facility] Site: [facility] INR POC 2.4 INR RANGE 2 - 3  Dietary changes: no    Health status changes: no    Bleeding/hemorrhagic complications: no    Recent/future hospitalizations: no    Any changes in medication regimen? no    Recent/future dental: no  Any missed doses?: no       Is patient compliant with meds? yes       Allergies: No Known Drug Allergies  Anticoagulation Management History:      The patient is taking warfarin and comes in today for a routine follow up visit.  Positive risk factors for bleeding include an age of 75 years or older.  The bleeding index is 'intermediate risk'.  Positive CHADS2 values include History of HTN and Age > 75 years old.  The start date was 10/08/1997.  His last INR was 1.6 RATIO.  Anticoagulation responsible provider: Shirlee Latch MD, Tahsin Benyo.  INR POC: 2.4.  Cuvette Lot#: [id number].  Exp: 02/2011.    Anticoagulation Management Assessment/Plan:      The patient's current anticoagulation dose is Coumadin 5 mg tabs: Take as directed by coumadin clinic..  The target INR is 2 - 3.  The next INR is due 01/10/2010.  Anticoagulation instructions were given to patient.  Results were reviewed/authorized by Cloyde Reams, RN, BSN.  He was notified by Cloyde Reams RN.         Prior Anticoagulation Instructions: INR 3.5 Skip today's dose then resume 5mg s everyday. Recheck in 2 weeks.   Current Anticoagulation Instructions: INR 2.4  Continue on same dosage 5mg  daily.  Recheck in 4 weeks.

## 2010-07-19 NOTE — Medication Information (Signed)
Summary: rov/sp  Anticoagulant Therapy  Managed by: Bethena Midget, RN, BSN Referring MD: Rollene Rotunda MD PCP: Link Snuffer MD: Jens Som MD, Arlys John Indication 1: Atrial Fibrillation (ICD-427.31) Lab Used: [facility] Site: [facility] INR POC 3.2 INR RANGE 2 - 3  Dietary changes: yes       Details: Having tooth pain, pending extraction on 11/11/09 Cx from today due high INR.   Health status changes: no    Bleeding/hemorrhagic complications: no    Recent/future hospitalizations: no    Any changes in medication regimen? no    Recent/future dental: yes     Details: 11/11/09 pending 2 extractions  Any missed doses?: no       Is patient compliant with meds? yes       Allergies: No Known Drug Allergies  Anticoagulation Management History:      The patient is taking warfarin and comes in today for a routine follow up visit.  Positive risk factors for bleeding include an age of 38 years or older.  The bleeding index is 'intermediate risk'.  Positive CHADS2 values include History of HTN and Age > 40 years old.  The start date was 10/08/1997.  His last INR was 1.6 RATIO.  Anticoagulation responsible provider: Jens Som MD, Arlys John.  INR POC: 3.2.  Cuvette Lot#: [id number].  Exp: 01/2011.    Anticoagulation Management Assessment/Plan:      The patient's current anticoagulation dose is Coumadin 5 mg tabs: Take as directed by coumadin clinic..  The target INR is 2 - 3.  The next INR is due 11/23/2009.  Anticoagulation instructions were given to patient.  Results were reviewed/authorized by Bethena Midget, RN, BSN.  He was notified by Bethena Midget, RN, BSN.         Prior Anticoagulation Instructions: INR 3.3  Skip today's dose of Coumadin then resume same dose of 1 tablet every day   Current Anticoagulation Instructions: INR 3.2 Skip today's dose on Wednesday take 1mg s then resume 5mg s everyday. Dose instruction per Dr Weston Brass, Pharm D. Recheck in 2 weeks.

## 2010-07-19 NOTE — Progress Notes (Signed)
Summary: Off Coumadin for Procedure  ---- Converted from flag ---- ---- 01/31/2010 1:09 PM, Nathen May, MD, [facility] wrote: yes thx  ---- 01/31/2010 8:48 AM, Weston Brass PharmD wrote: Pt pending prostate biopsy at the end of the month.  Needs to hold coumadin 4-5 days prior.  Is this okay?  Thanks ------------------------------  Phone Note Outgoing Call   Call placed by: Weston Brass PharmD,  February 01, 2010 9:23 AM Summary of Call: LM with pt's wife to have him call back. Weston Brass PharmD  February 01, 2010 9:24 AM   Spoke with pt.  He is aware to stop Coumadin on 8/21.  Made f/u appt for 9/6 at 11:15.  Initial call taken by: Weston Brass PharmD,  February 01, 2010 10:06 AM

## 2010-07-19 NOTE — Letter (Signed)
[facility]   Imported By: Sherian Rein 01/25/2010 10:49:55  _____________________________________________________________________  External Attachment:    Type:   Image     Comment:   External Document

## 2010-07-19 NOTE — Assessment & Plan Note (Signed)
Summary: PER COUMADIN CLINIC/ WAS CONFUSED AT THE COUMADIN CLINIC/NWS   Vital Signs:  Patient profile:   77 year old male Height:      77 inches Weight:      186 pounds BMI:     22.14 O2 Sat:      97 % Temp:     97.5 degrees F oral Pulse rate:   68 / minute BP sitting:   112 / 76  (left arm) Cuff size:   regular  Vitals Entered By: Alysia Penna (February 24, 2010 3:27 PM) CC: pt c/o confusion x 2-3 mths./ cp sma Comments Pt is no longer taking the Zocor 5MG    Primary Care Provider:  Norins  CC:  pt c/o confusion x 2-3 mths./ cp sma.  History of Present Illness: Patient presents due to increasing difficulty with memory and cognitive function. He has increasing diffiuclty expressing himself. He has had no head trauma, no acute illness. He denies any focal weakness, paresthesia or other focal neurologic symptoms.  Current Medications (verified): 1)  Atenolol 50 Mg  Tabs (Atenolol) .... Take Once Daily 2)  Zocor 5 Mg Tabs (Simvastatin) .... Take 1 Tablet By Mouth 2 Days Per Week 3)  Coumadin 5 Mg Tabs (Warfarin Sodium) .... Take As Directed By Coumadin Clinic.  Allergies (verified): No Known Drug Allergies  Past History:  Past Medical History: Last updated: 07/09/2008 BRADYCARDIA (ICD-427.89) HYPERTENSION (ICD-401.9) Hx of POLYP, COLON (ICD-211.3) BENIGN PROSTATIC HYPERTROPHY, HX OF (ICD-V13.8) HYPERLIPIDEMIA (ICD-272.4) ATRIAL FIBRILLATION, CHRONIC, HX OF (ICD-V12.59) Hx of HEMORRHOIDS (ICD-455.6) Hx of DIVERTICULOSIS, COLON (ICD-562.10) Previously implanted pacemaker Guidant      Past Surgical History: Last updated: 10/21/2007 PACEMAKER, PERMANENT (ICD-V45.01) * Hx of VEIN STRIPPING LASER PROCEDURE TONSILLECTOMY AND ADENOIDECTOMY, HX OF (ICD-V45.79)  Family History: Last updated: 07/09/2008 Negative FH of Diabetes, Hypertension, or Coronary Artery Disease  Review of Systems  The patient denies anorexia, fever, weight loss, weight gain, decreased  hearing, chest pain, syncope, dyspnea on exertion, prolonged cough, headaches, abdominal pain, incontinence, muscle weakness, suspicious skin lesions, difficulty walking, depression, and enlarged lymph nodes.    Physical Exam  General:  Tall, lanky white male in no distress Head:  normocephalic and atraumatic.   Eyes:  pupils equal, pupils round, corneas and lenses clear, and no injection.   Ears:  External ear exam shows no significant lesions or deformities.  Otoscopic examination reveals clear canals, tympanic membranes are intact bilaterally without bulging, retraction, inflammation or discharge. Hearing is grossly normal bilaterally. Neck:  supple, full ROM, no masses, and no thyromegaly.   Lungs:  normal respiratory effort and normal breath sounds.   Heart:  IRIR with good rate control. Msk:  normal ROM and no joint deformities.   Pulses:  2+ radial Neurologic:  MMSE 1) - day and date 2) - pres/gov/sen - ok; content - poor 3) 5 fwd - ok, 5 rev - 1 error, world -ok 4) serial 7's - several errors; change - ok, nickles - 25 5) 3 words - 1/3 at 5 minutes 6) naming - items, not fluent - difficulty with instructions 7) parables - poor abstraction 8) judgement - ok 9) clockface - diffiuclty understanding instructions; slow and poor execution; difficulty with registering assigned time (placement of hands). Skin:  turgor normal and color normal.   Psych:  normally interactive and good eye contact.     Impression & Recommendations:  Problem # 1:  MENTAL CONFUSION (ICD-298.9) Patient with poor short term memory and cognitive difficulty -  poor clock face, parables.   Plan - metabolic work-up to include TSH, B12, RPR           imaging of brain.           further recommendations based on results- very likely a candidate for medication.  Orders: TLB-B12 + Folate Pnl (82746_82607-B12/FOL) TLB-TSH (Thyroid Stimulating Hormone) (84443-TSH) TLB-BMP (Basic Metabolic Panel-BMET)  (80048-METABOL) T-RPR (Syphilis) (02542-70623) Cardiology Referral (Cardiology) Radiology Referral (Radiology)  Addendum - labs are normal  Complete Medication List: 1)  Atenolol 50 Mg Tabs (Atenolol) .... Take once daily 2)  Zocor 5 Mg Tabs (Simvastatin) .... Take 1 tablet by mouth 2 days per week 3)  Coumadin 5 Mg Tabs (Warfarin sodium) .... Take as directed by coumadin clinic.  Patient: [name] Note: All result statuses are Final unless otherwise noted.  Tests: (1) B12 + Folate Panel (B12/FOL)   Vitamin B12               478 pg/mL                   211-911   Folate                    7.7 ng/mL     Deficient  0.4 - 3.4 ng/mL     Indeterminate  3.4 - 5.4 ng/mL     Normal  >5.4 ng/mL  Tests: (2) TSH (TSH)   FastTSH                   2.10 uIU/mL                 0.35-5.50  Tests: (3) BMP (METABOL)   Sodium                    142 mEq/L                   135-145   Potassium                 4.5 mEq/L                   3.5-5.1   Chloride                  104 mEq/L                   96-112   Carbon Dioxide            30 mEq/L                    19-32   Glucose                   98 mg/dL                    76-28   BUN                       23 mg/dL                    3-15   Creatinine                1.0 mg/dL                   1.7-6.1   Calcium  9.2 mg/dL                   4.5-40.9   GFR                       73.11 mL/min                >60  Tests: (1) RPR Reflex to T.pallidum Ab, Total (81191)   RPR                       NON REAC                    NON REAC

## 2010-07-19 NOTE — Medication Information (Signed)
Summary: rov/jm  Anticoagulant Therapy  Managed by: Weston Brass, PharmD Referring MD: Rollene Rotunda MD PCP: Link Snuffer MD: Daleen Squibb MD, Maisie Fus Indication 1: Atrial Fibrillation (ICD-427.31) Lab Used: [facility] Site: [facility] INR POC 2.7 INR RANGE 2 - 3  Dietary changes: no    Health status changes: no    Bleeding/hemorrhagic complications: no    Recent/future hospitalizations: no    Any changes in medication regimen? no    Recent/future dental: no  Any missed doses?: no       Is patient compliant with meds? yes       Allergies: No Known Drug Allergies  Anticoagulation Management History:      The patient is taking warfarin and comes in today for a routine follow up visit.  Positive risk factors for bleeding include an age of 16 years or older.  The bleeding index is 'intermediate risk'.  Positive CHADS2 values include History of HTN and Age > 73 years old.  The start date was 10/08/1997.  His last INR was 1.6 RATIO.  Anticoagulation responsible provider: Daleen Squibb MD, Maisie Fus.  INR POC: 2.7.  Cuvette Lot#: [id number].  Exp: 04/2011.    Anticoagulation Management Assessment/Plan:      The patient's current anticoagulation dose is Coumadin 5 mg tabs: Take as directed by coumadin clinic..  The target INR is 2 - 3.  The next INR is due 05/06/2010.  Anticoagulation instructions were given to patient.  Results were reviewed/authorized by Weston Brass, PharmD.  He was notified by Haynes Hoehn, PharmD Candidate.         Prior Anticoagulation Instructions: INR 2.9  Continue taking one tablet every day.  We will see you in four weeks.  Current Anticoagulation Instructions: INR 2.7  Continue Coumadin as scheduled:  1 tablet every day of the week.  Return to clinic in 4 weeks.

## 2010-07-19 NOTE — Letter (Signed)
Summary: [facility]  [facility]   Imported By: Sherian Rein 03/01/2010 13:50:08  _____________________________________________________________________  External Attachment:    Type:   Image     Comment:   External Document

## 2010-07-19 NOTE — Medication Information (Signed)
Summary: ROV/TM  Anticoagulant Therapy  Managed by: Weston Brass, PharmD Referring MD: Rollene Rotunda MD PCP: Link Snuffer MD: Excell Seltzer MD, Casimiro Needle Indication 1: Atrial Fibrillation (ICD-427.31) Lab Used: [facility] Site: [facility] INR POC 2.9 INR RANGE 2 - 3  Dietary changes: no    Health status changes: no    Bleeding/hemorrhagic complications: no    Recent/future hospitalizations: no    Any changes in medication regimen? no    Recent/future dental: no  Any missed doses?: no       Is patient compliant with meds? yes       Allergies: No Known Drug Allergies  Anticoagulation Management History:      The patient is taking warfarin and comes in today for a routine follow up visit.  Positive risk factors for bleeding include an age of 75 years or older.  The bleeding index is 'intermediate risk'.  Positive CHADS2 values include History of HTN and Age > 75 years old.  The start date was 10/08/1997.  His last INR was 1.6 RATIO.  Anticoagulation responsible provider: Excell Seltzer MD, Casimiro Needle.  INR POC: 2.9.  Cuvette Lot#: [id number].  Exp: 03/2011.    Anticoagulation Management Assessment/Plan:      The patient's current anticoagulation dose is Coumadin 5 mg tabs: Take as directed by coumadin clinic..  The target INR is 2 - 3.  The next INR is due 04/04/2010.  Anticoagulation instructions were given to patient.  Results were reviewed/authorized by Weston Brass, PharmD.  He was notified by Kennieth Francois.         Prior Anticoagulation Instructions: INR 2.4 Continue 1 pill everyday. Recheck in 2 weeks.   Current Anticoagulation Instructions: INR 2.9  Continue taking one tablet every day.  We will see you in four weeks.

## 2010-07-19 NOTE — Medication Information (Signed)
Summary: rov.mp  Anticoagulant Therapy  Managed by: Lew Dawes, PharmD Candidate Referring MD: Rollene Rotunda MD PCP: Link Snuffer MD: Jens Som MD, Arlys John Indication 1: Atrial Fibrillation (ICD-427.31) Lab Used: [facility] Site: [facility] INR POC 2.4 INR RANGE 2 - 3  Dietary changes: no    Health status changes: no    Bleeding/hemorrhagic complications: no    Recent/future hospitalizations: no    Any changes in medication regimen? no    Recent/future dental: no  Any missed doses?: no       Is patient compliant with meds? yes       Current Medications (verified): 1)  Atenolol 50 Mg  Tabs (Atenolol) .... Take Once Daily 2)  Zocor 5 Mg Tabs (Simvastatin) .... Take 1 Tablet By Mouth 2 Days Per Week 3)  Coumadin 5 Mg Tabs (Warfarin Sodium) .... Take As Directed By Coumadin Clinic.  Allergies (verified): No Known Drug Allergies  Anticoagulation Management History:      The patient is taking warfarin and comes in today for a routine follow up visit.  Positive risk factors for bleeding include an age of 75 years or older.  The bleeding index is 'intermediate risk'.  Positive CHADS2 values include History of HTN and Age > 15 years old.  The start date was 10/08/1997.  His last INR was 1.6 RATIO.  Anticoagulation responsible provider: Jens Som MD, Arlys John.  INR POC: 2.4.  Cuvette Lot#: [id number].  Exp: 09/2010.    Anticoagulation Management Assessment/Plan:      The patient's current anticoagulation dose is Coumadin 5 mg tabs: Take as directed by coumadin clinic..  The target INR is 2 - 3.  The next INR is due 08/02/2009.  Anticoagulation instructions were given to patient.  Results were reviewed/authorized by Lew Dawes, PharmD Candidate.  He was notified by Lew Dawes, PharmD Candidate.         Prior Anticoagulation Instructions: INR 2.4  Continue taking 1 tab daily.  Recheck in 4 weeks.    Current Anticoagulation Instructions: INR 2.4  Continue same dose of 1  tablet daily. Recheck in 4 weeks.

## 2010-07-19 NOTE — Letter (Signed)
Summary: [facility]  [facility]   Imported By: Lester [place] 01/17/2010 09:19:14  _____________________________________________________________________  External Attachment:    Type:   Image     Comment:   External Document

## 2010-07-19 NOTE — Letter (Signed)
Summary: [facility] Office Visit Note  [facility] Office Visit Note   Imported By: Roderic Ovens 02/22/2010 11:30:32  _____________________________________________________________________  External Attachment:    Type:   Image     Comment:   External Document

## 2010-07-19 NOTE — Medication Information (Signed)
Summary: rov/sp  Anticoagulant Therapy  Managed by: Bethena Midget, RN, BSN Referring MD: Rollene Rotunda MD PCP: Link Snuffer MD: Gala Romney MD, Reuel Boom Indication 1: Atrial Fibrillation (ICD-427.31) Lab Used: [facility] Site: [facility] INR POC 2.4 INR RANGE 2 - 3  Dietary changes: no    Health status changes: no    Bleeding/hemorrhagic complications: no    Recent/future hospitalizations: no    Any changes in medication regimen? no    Recent/future dental: no  Any missed doses?: no       Is patient compliant with meds? yes      Comments: Pt states he restarted post procedure, not sure of restart date.   Allergies: No Known Drug Allergies  Anticoagulation Management History:      The patient is taking warfarin and comes in today for a routine follow up visit.  Positive risk factors for bleeding include an age of 75 years or older.  The bleeding index is 'intermediate risk'.  Positive CHADS2 values include History of HTN and Age > 75 years old.  The start date was 10/08/1997.  His last INR was 1.6 RATIO.  Anticoagulation responsible provider: Bensimhon MD, Reuel Boom.  INR POC: 2.4.  Cuvette Lot#: [id number].  Exp: 03/2011.    Anticoagulation Management Assessment/Plan:      The patient's current anticoagulation dose is Coumadin 5 mg tabs: Take as directed by coumadin clinic..  The target INR is 2 - 3.  The next INR is due 03/07/2010.  Anticoagulation instructions were given to patient.  Results were reviewed/authorized by Bethena Midget, RN, BSN.  He was notified by Bethena Midget, RN, BSN.         Prior Anticoagulation Instructions: INR 2.1  Continue same dose of 1 tablet daily.  Will f/u with directions for procedure once cleared by Dr. Graciela Husbands.   Current Anticoagulation Instructions: INR 2.4 Continue 1 pill everyday. Recheck in 2 weeks.

## 2010-07-19 NOTE — Letter (Signed)
Summary: [facility] Office Visit Note   [facility] Office Visit Note   Imported By: Roderic Ovens 01/11/2010 15:53:24  _____________________________________________________________________  External Attachment:    Type:   Image     Comment:   External Document

## 2010-07-19 NOTE — Medication Information (Signed)
Summary: rov/ewj  Anticoagulant Therapy  Managed by: Bethena Midget, RN, BSN Referring MD: Rollene Rotunda MD PCP: Link Snuffer MD: Jens Som MD, Arlys John Indication 1: Atrial Fibrillation (ICD-427.31) Lab Used: [facility] Site: [facility] INR POC 2.2 INR RANGE 2 - 3  Dietary changes: no    Health status changes: no    Bleeding/hemorrhagic complications: no    Recent/future hospitalizations: no    Any changes in medication regimen? no    Recent/future dental: no  Any missed doses?: no       Is patient compliant with meds? yes       Allergies: No Known Drug Allergies  Anticoagulation Management History:      The patient is taking warfarin and comes in today for a routine follow up visit.  Positive risk factors for bleeding include an age of 75 years or older.  The bleeding index is 'intermediate risk'.  Positive CHADS2 values include History of HTN and Age > 31 years old.  The start date was 10/08/1997.  His last INR was 1.6 RATIO.  Anticoagulation responsible provider: Jens Som MD, Arlys John.  INR POC: 2.2.  Cuvette Lot#: [id number].  Exp: 03/2011.    Anticoagulation Management Assessment/Plan:      The patient's current anticoagulation dose is Coumadin 5 mg tabs: Take as directed by coumadin clinic..  The target INR is 2 - 3.  The next INR is due 01/31/2010.  Anticoagulation instructions were given to patient.  Results were reviewed/authorized by Bethena Midget, RN, BSN.  He was notified by Bethena Midget, RN, BSN.         Prior Anticoagulation Instructions: INR 1.2  Take 1.5 tablets x 2 doses, then resume same doage 1 tablet daily. Recheck in 1 week.    Current Anticoagulation Instructions: INR 2.2 Continue 1 pill everyday.  Recheck in 3 weeks.

## 2010-07-19 NOTE — Letter (Signed)
Summary: Device-Delinquent Check  [facility], Main Office  [address]   [place], [place] [address]   Phone: [phone]  Fax: [phone]     March 18, 2010 MRN: [id number]   [facility] [address] [place], [place]  [address]   Dear Mr. Hunn,  According to our records, you have not had your implanted device checked in the recommended period of time.  We are unable to determine appropriate device function without checking your device on a regular basis.  Please call our office to schedule an appointment with Dr Graciela Husbands, as soon as possible.  If you are having your device checked by another physician, please call us so that we may update our records.  Thank you,  Letta Moynahan, EMT  March 18, 2010 1:31 PM  [facility] certified

## 2010-07-19 NOTE — Letter (Signed)
[facility]: [facility]   Imported By: Sherian Rein 01/04/2010 11:36:53  _____________________________________________________________________  External Attachment:    Type:   Image     Comment:   External Document

## 2010-07-19 NOTE — Letter (Signed)
Summary: TTM   TTM   Imported By: Roderic Ovens 01/11/2010 15:57:56  _____________________________________________________________________  External Attachment:    Type:   Image     Comment:   External Document

## 2010-07-19 NOTE — Medication Information (Signed)
Summary: [name]  Anticoagulant Therapy  Managed by: Cloyde Reams, RN, BSN Referring MD: Rollene Rotunda MD PCP: Link Snuffer MD: Shirlee Latch MD, Dalton Indication 1: Atrial Fibrillation (ICD-427.31) Lab Used: [facility] Site: [facility] INR POC 1.1 INR RANGE 2 - 3  Dietary changes: no    Health status changes: no    Bleeding/hemorrhagic complications: no    Recent/future hospitalizations: no    Any changes in medication regimen? no    Recent/future dental: no  Any missed doses?: yes     Details: Holding coumadin prior to bx tomorrow.  Holding since 12/17/09.    Is patient compliant with meds? yes      Comments: Will fax results to Dr Annabell Howells.  Allergies: No Known Drug Allergies  Anticoagulation Management History:      The patient is taking warfarin and comes in today for a routine follow up visit.  Positive risk factors for bleeding include an age of 75 years or older.  The bleeding index is 'intermediate risk'.  Positive CHADS2 values include History of HTN and Age > 16 years old.  The start date was 10/08/1997.  His last INR was 1.6 RATIO.  Anticoagulation responsible provider: Shirlee Latch MD, Dalton.  INR POC: 1.1.  Cuvette Lot#: [id number].  Exp: 02/2011.    Anticoagulation Management Assessment/Plan:      The patient's current anticoagulation dose is Coumadin 5 mg tabs: Take as directed by coumadin clinic..  The target INR is 2 - 3.  The next INR is due 12/30/2009.  Anticoagulation instructions were given to patient.  Results were reviewed/authorized by Cloyde Reams, RN, BSN.  He was notified by Cloyde Reams, RN, BSN.         Prior Anticoagulation Instructions: INR 2.4  Continue on same dosage 5mg  daily.  Recheck in 4 weeks.    Current Anticoagulation Instructions: INR 1.1   Resume coumadin per Dr instructions after procedure 5mg  daily.  Recheck in 1 week.

## 2010-07-19 NOTE — Letter (Signed)
Summary: Device-Delinquent Check  [facility], Main Office  [address]   [place], [place] [address]   Phone: [phone]  Fax: [phone]     January 17, 2010 MRN: [id number]   [facility] [address] [place], [place]  [address]   Dear [name],  According to our records, you have not had your implanted device checked in the recommended period of time.  We are unable to determine appropriate device function without checking your device on a regular basis.  Please call our office to schedule an appointment as soon as possible.  If you are having your device checked by another physician, please call us so that we may update our records.  Thank you,   [facility]

## 2010-07-19 NOTE — Letter (Signed)
[facility]: [facility]   Imported By: Sherian Rein 12/28/2009 13:57:28  _____________________________________________________________________  External Attachment:    Type:   Image     Comment:   External Document

## 2010-07-19 NOTE — Medication Information (Signed)
Summary: rov/tm  Anticoagulant Therapy  Managed by: Bethena Midget, RN, BSN Referring MD: Rollene Rotunda MD PCP: Link Snuffer MD: Jens Som MD, Arlys John Indication 1: Atrial Fibrillation (ICD-427.31) Lab Used: [facility] Site: [facility] INR POC 3.5 INR RANGE 2 - 3  Dietary changes: yes       Details: s/p extractions diet slightly different  Health status changes: no    Bleeding/hemorrhagic complications: no    Recent/future hospitalizations: no    Any changes in medication regimen? no    Recent/future dental: yes     Details: Had extractions on 11/11/09  Any missed doses?: no       Is patient compliant with meds? yes       Allergies: No Known Drug Allergies  Anticoagulation Management History:      The patient is taking warfarin and comes in today for a routine follow up visit.  Positive risk factors for bleeding include an age of 74 years or older.  The bleeding index is 'intermediate risk'.  Positive CHADS2 values include History of HTN and Age > 3 years old.  The start date was 10/08/1997.  His last INR was 1.6 RATIO.  Anticoagulation responsible provider: Jens Som MD, Arlys John.  INR POC: 3.5.  Cuvette Lot#: [id number].  Exp: 01/2011.    Anticoagulation Management Assessment/Plan:      The patient's current anticoagulation dose is Coumadin 5 mg tabs: Take as directed by coumadin clinic..  The target INR is 2 - 3.  The next INR is due 12/07/2009.  Anticoagulation instructions were given to patient.  Results were reviewed/authorized by Bethena Midget, RN, BSN.  He was notified by Bethena Midget, RN, BSN.         Prior Anticoagulation Instructions: INR 3.2 Skip today's dose on Wednesday take 1mg s then resume 5mg s everyday. Dose instruction per Dr Weston Brass, Pharm D. Recheck in 2 weeks.   Current Anticoagulation Instructions: INR 3.5 Skip today's dose then resume 5mg s everyday. Recheck in 2 weeks.

## 2010-07-19 NOTE — Progress Notes (Signed)
Summary: RESULTS-hold for [name]  Phone Note Call from Patient Call back at [facility] Phone [phone]   Summary of Call: Patient is requesting results of CT.  Initial call taken by: Lamar Sprinkles, CMA,  March 07, 2010 1:55 PM  Follow-up for Phone Call        no changes to account for memory loss.  Follow-up by: Jacques Navy MD,  March 07, 2010 5:04 PM  Additional Follow-up for Phone Call Additional follow up Details #1::        Pt's wife informed. They also recieved lab results. Is medication to help w/memory an option?  Additional Follow-up by: Lamar Sprinkles, CMA,  March 08, 2010 10:17 AM    Additional Follow-up for Phone Call Additional follow up Details #2::    Medication is an option - aricept starter pak if we have it otherwise - aricept 5mg  at bedtime x 4 weeks # 28  then advance to 10mg  at bedtime #30 with prn refills. F/u visit in 3 months  Follow-up by: Jacques Navy MD,  March 08, 2010 12:40 PM  Additional Follow-up for Phone Call Additional follow up Details #3:: Details for Additional Follow-up Action Taken: Pt's wife was not avail. I informed patient but he told me that he was given 2 rx's from the pharmacist - aricept and namenda. Per [facility], pt does not have rx's for these. Called in aricept - will call later to discuss with pt's wife...............Marland KitchenLamar Sprinkles, CMA  March 08, 2010 1:56 PM   Spoke w/wife, she went thru all pt's medications. Only difference is that pt is not taking simvastatin. She feels that stopping that med was discussed with his cardiologist. She is also aware to increase to 10mg  after 4 wks of 5 mgs. Per wife she seems to see some improvment so far and will call office back with any questions. .......Marland KitchenLamar Sprinkles, CMA  March 17, 2010 6:01 PM  Additional Follow-up by: Jacques Navy MD,  March 18, 2010 12:52 PM  New/Updated Medications: ARICEPT 5 MG TABS (DONEPEZIL HCL) 1 at bedtime x 4 weeks then increase to  10mg  at bedtime(See new rx) ARICEPT 5 MG TABS (DONEPEZIL HCL) 1 at bedtime x 4 weeks then increase to 10mg  at bedtime(See new rx) ARICEPT 10 MG TABS (DONEPEZIL HCL) 1 at bedtime - start after taking 5 mg x 1 month Prescriptions: ARICEPT 10 MG TABS (DONEPEZIL HCL) 1 at bedtime - start after taking 5 mg x 1 month  #90 x 3   Entered by:   Lamar Sprinkles, CMA   Authorized by:   Jacques Navy MD   Signed by:   Lamar Sprinkles, CMA on 03/08/2010   Method used:   Telephoned to ...       [facility]  [address]  [phone]* (retail)       [address]       [place], [place]  [address]       Ph: [phone] or [phone]       Fax: [phone]   RxID:   [id number] ARICEPT 5 MG TABS (DONEPEZIL HCL) 1 at bedtime x 4 weeks then increase to 10mg  at bedtime(See new rx)  #28 x 0   Entered by:   Lamar Sprinkles, CMA   Authorized by:   Jacques Navy MD   Signed by:   Lamar Sprinkles, CMA on 03/08/2010   Method used:   Telephoned to .Marland KitchenMarland Kitchen  [facility]  [address]  [phone]* (retail)       [address]       [place], [place]  [address]       Ph: [phone] or [phone]       Fax: [phone]   RxID:   [id number]

## 2010-07-20 ENCOUNTER — Encounter: Payer: Self-pay | Admitting: Internal Medicine

## 2010-07-21 NOTE — Assessment & Plan Note (Signed)
Summary: pc2 per Baxter Hire   Visit Type:  PPM - Surveyor, quantity Provider:  [place]ins  CC:  no complaints.  History of Present Illness: Mr. [name] is seen in followup for atrial fibrillation with bradycardia. He is status post pacemaker implantation and is having no associated symptoms.  He was having myalgias with his statin therapy; we have changed the dosing to 2 times a week. He is tolerating this without difficulty.The patient denies SOB, chest pain, edema or palpitations   Problems Prior to Update: 1)  Adenocarcinoma, Prostate, Gleason Grade 4  (ICD-185) 2)  Mental Confusion  (ICD-298.9) 3)  Uri  (ICD-465.9) 4)  Pacemaker, Permanent  (ICD-V45.01) 5)  Atrial Fibrillation  (ICD-427.31) 6)  Other Abnormal Blood Chemistry  (ICD-790.6) 7)  Leg Pain, Left  (ICD-729.5) 8)  Encounter For Long-term Use of Other Medications  (ICD-V58.69) 9)  Bradycardia  (ICD-427.89) 10)  Hypertension  (ICD-401.9) 11)  Hx of Polyp, Colon  (ICD-211.3) 12)  Benign Prostatic Hypertrophy, Hx of  (ICD-V13.8) 13)  Hyperlipidemia  (ICD-272.4) 14)  Atrial Fibrillation, Chronic, Hx of  (ICD-V12.59) 15)  Hx of Vein Stripping Laser Procedure  () 16)  Tonsillectomy and Adenoidectomy, Hx of  (ICD-V45.79) 17)  Hx of Hemorrhoids  (ICD-455.6) 18)  Hx of Diverticulosis, Colon  (ICD-562.10)  Current Medications (verified): 1)  Atenolol 50 Mg  Tabs (Atenolol) .... Take Once Daily 2)  Zocor 5 Mg Tabs (Simvastatin) .... Take 1 Tablet By Mouth 2 Days Per Week 3)  Coumadin 5 Mg Tabs (Warfarin Sodium) .... Take As Directed By Coumadin Clinic. 4)  Aricept 10 Mg Tabs (Donepezil Hcl) .Marland Kitchen.. 1 At Bedtime - Start After Taking 5 Mg X 1 Month  Allergies (verified): No Known Drug Allergies  Past History:  Past Medical History: Last updated: 06/27/2010 ADENOCARCINOMA, PROSTATE, GLEASON GRADE 4 (ICD-185)-completed XRT Dec '11 MENTAL CONFUSION (ICD-298.9) URI (ICD-465.9) PACEMAKER, PERMANENT (ICD-V45.01) ATRIAL  FIBRILLATION (ICD-427.31) OTHER ABNORMAL BLOOD CHEMISTRY (ICD-790.6) LEG PAIN, LEFT (ICD-729.5) ENCOUNTER FOR LONG-TERM USE OF OTHER MEDICATIONS (ICD-V58.69) BRADYCARDIA (ICD-427.89) HYPERTENSION (ICD-401.9) Hx of POLYP, COLON (ICD-211.3) BENIGN PROSTATIC HYPERTROPHY, HX OF (ICD-V13.8) HYPERLIPIDEMIA (ICD-272.4) ATRIAL FIBRILLATION, CHRONIC, HX OF (ICD-V12.59) * Hx of VEIN STRIPPING LASER PROCEDURE TONSILLECTOMY AND ADENOIDECTOMY, HX OF (ICD-V45.79) Hx of HEMORRHOIDS (ICD-455.6) Hx of DIVERTICULOSIS, COLON (ICD-562.10) Previously implanted pacemaker Guidant      Past Surgical History: Last updated: 10/21/2007 PACEMAKER, PERMANENT (ICD-V45.01) * Hx of VEIN STRIPPING LASER PROCEDURE TONSILLECTOMY AND ADENOIDECTOMY, HX OF (ICD-V45.79)  Family History: Last updated: 07/09/2008 Negative FH of Diabetes, Hypertension, or Coronary Artery Disease  Social History: Last updated: 08/09/2009 [name] - '45 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  Tobacco Use - No.   Risk Factors: Smoking Status: never (07/09/2008)  Vital Signs:  Patient profile:   77 year old male Height:      77 inches Weight:      191.50 pounds BMI:     22.79 Pulse rate:   63 / minute BP sitting:   115 / 71  (left arm) Cuff size:   regular  Vitals Entered By: Caralee Ates CMA (July 05, 2010 10:57 AM)  Physical Exam  General:  The patient was alert and oriented in no acute distress. HEENT Normal.  Neck veins were flat, carotids were brisk.  Lungs were clear.  Heart sounds were regular  without murmurs or gallops.  Abdomen was soft with active bowel sounds. There is no clubbing cyanosis or edema. Skin Warm and dry pocket is well-healed   PPM Specifications Following MD:  Sherryl Manges, MD     PPM Vendor:  [facility] [employer]      PPM Model Number:  [id number]     PPM Serial Number:  [id number] PPM DOI:  05/07/2007     PPM Implanting MD:  Sherryl Manges, MD  Lead 1    Location: RV     DOI: 01/27/1998     Model #: [id number]     Serial #: [id number]     Status: active   Indications:  Tachy-Brady Syndrome    PPM Follow Up Battery Voltage:  GOOD V     Battery Est. Longevity:  3.5 YRS     Right Ventricle  Amplitude: 11.9 mV, Impedance: 530 ohms, Threshold: 0.6 V at 0.50 msec  Episodes MS Episodes:  0     Coumadin:  Yes Ventricular Pacing:  61%  Parameters Mode:  SSIR     Lower Rate Limit:  60     Upper Rate Limit:  130 Next Cardiology Appt Due:  12/19/2010 Tech Comments:  NORMAL DEVICE FUNCTION.  SEVERAL TACHY EPISODES ON 04-08-10.  PT HAS NO RECOLLECTION OF EPISODES.  NO CHANGES MADE. ROV IN 6 MTHS W/DEVICE CLINIC. TTMs THRU [employer]. [name]  July 05, 2010 10:56 AM  Impression & Recommendations:  Problem # 1:  PACEMAKER, PERMANENT (ICD-V45.01) Device parameters and data were reviewed and no changes were made  Problem # 2:  ATRIAL FIBRILLATION (ICD-427.31) Permanent on Coumadin. Right now we will continue him on this anticoagulant His updated medication list for this problem includes:    Atenolol 50 Mg Tabs (Atenolol) .Marland Kitchen... Take once daily    Coumadin 5 Mg Tabs (Warfarin sodium) .Marland Kitchen... Take as directed by coumadin clinic.  Problem # 3:  BRADYCARDIA (ICD-427.89) status post pacemaker His updated medication list for this problem includes:    Atenolol 50 Mg Tabs (Atenolol) .Marland Kitchen... Take once daily    Coumadin 5 Mg Tabs (Warfarin sodium) .Marland Kitchen... Take as directed by coumadin clinic.  Problem # 4:  HYPERTENSION (ICD-401.9) well-controlled His updated medication list for this problem includes:    Atenolol 50 Mg Tabs (Atenolol) .Marland Kitchen... Take once daily  His updated medication list for this problem includes:    Atenolol 50 Mg Tabs (Atenolol) .Marland Kitchen... Take once daily  Patient Instructions: 1)  Your physician recommends that you  continue on your current medications as directed. Please refer to the Current Medication list given to you today. 2)  Your physician wants you to follow-up in:6 months with [facility] Clinic and 12 months with Dr. Logan Bores will receive a reminder letter in the mail two months in advance. If you don't receive a letter, please call our office to schedule the follow-up appointment.

## 2010-07-21 NOTE — Letter (Signed)
Summary: MMSE  MMSE   Imported By: Lester Union 07/05/2010 09:03:56  _____________________________________________________________________  External Attachment:    Type:   Image     Comment:   External Document

## 2010-07-21 NOTE — Medication Information (Signed)
Summary: rov/eac  Anticoagulant Therapy  Managed by: Bethena Midget, RN, BSN Referring MD: Rollene Rotunda MD PCP: Link Snuffer MD: Gala Romney MD, Reuel Boom Indication 1: Atrial Fibrillation (ICD-427.31) Lab Used: [facility] Site: [facility] INR POC 2.2 INR RANGE 2 - 3  Dietary changes: no    Health status changes: no    Bleeding/hemorrhagic complications: no    Recent/future hospitalizations: no    Any changes in medication regimen? no    Recent/future dental: no  Any missed doses?: no       Is patient compliant with meds? yes       Allergies: No Known Drug Allergies  Anticoagulation Management History:      The patient is taking warfarin and comes in today for a routine follow up visit.  Positive risk factors for bleeding include an age of 26 years or older.  The bleeding index is 'intermediate risk'.  Positive CHADS2 values include History of HTN and Age > 31 years old.  The start date was 10/08/1997.  His last INR was 1.6 RATIO.  Anticoagulation responsible provider: Chassity Ludke MD, Reuel Boom.  INR POC: 2.2.  Cuvette Lot#: [id number].  Exp: 07/2011.    Anticoagulation Management Assessment/Plan:      The patient's current anticoagulation dose is Coumadin 5 mg tabs: Take as directed by coumadin clinic..  The target INR is 2 - 3.  The next INR is due 07/05/2010.  Anticoagulation instructions were given to patient.  Results were reviewed/authorized by Bethena Midget, RN, BSN.  He was notified by Bethena Midget, RN, BSN.         Prior Anticoagulation Instructions: INR 2.1  Continue taking 1 tablet every day.  Return to clinic in 4 weeks.    Current Anticoagulation Instructions: INR 2.2 Continue 1 tablet everyday. Recheck in 4 weeks.

## 2010-07-21 NOTE — Cardiovascular Report (Signed)
Summary: TTM   TTM   Imported By: Roderic Ovens 06/28/2010 10:21:44  _____________________________________________________________________  External Attachment:    Type:   Image     Comment:   External Document

## 2010-07-21 NOTE — Cardiovascular Report (Signed)
Summary: Office Visit   Office Visit   Imported By: Roderic Ovens 07/11/2010 11:17:24  _____________________________________________________________________  External Attachment:    Type:   Image     Comment:   External Document

## 2010-07-21 NOTE — Medication Information (Signed)
Summary: rov/tm  Anticoagulant Therapy  Managed by: Cloyde Reams, RN, BSN Referring MD: Rollene Rotunda MD PCP: Link Snuffer MD: Riley Kill MD, Maisie Fus Indication 1: Atrial Fibrillation (ICD-427.31) Lab Used: [facility] Site: [facility] INR POC 1.9 INR RANGE 2 - 3  Dietary changes: yes       Details: Incr vit K  Health status changes: no    Bleeding/hemorrhagic complications: no    Recent/future hospitalizations: no    Any changes in medication regimen? no    Recent/future dental: no  Any missed doses?: no       Is patient compliant with meds? yes       Allergies: No Known Drug Allergies  Anticoagulation Management History:      The patient is taking warfarin and comes in today for a routine follow up visit.  Positive risk factors for bleeding include an age of 75 years or older.  The bleeding index is 'intermediate risk'.  Positive CHADS2 values include History of HTN and Age > 18 years old.  The start date was 10/08/1997.  His last INR was 1.6 RATIO.  Anticoagulation responsible provider: Riley Kill MD, Maisie Fus.  INR POC: 1.9.  Cuvette Lot#: [id number].  Exp: 07/2011.    Anticoagulation Management Assessment/Plan:      The patient's current anticoagulation dose is Coumadin 5 mg tabs: Take as directed by coumadin clinic..  The target INR is 2 - 3.  The next INR is due 08/02/2010.  Anticoagulation instructions were given to patient.  Results were reviewed/authorized by Cloyde Reams, RN, BSN.  He was notified by Cloyde Reams RN.         Prior Anticoagulation Instructions: INR 2.2 Continue 1 tablet everyday. Recheck in 4 weeks.   Current Anticoagulation Instructions: INR 1.9  Take 1.5 tablets today, then resume same dosage 1 tablet daily.  Recheck in 4 weeks.

## 2010-07-21 NOTE — Assessment & Plan Note (Signed)
Summary: f/u appt /#/cd   Vital Signs:  Patient profile:   77 year old male Height:      77 inches Weight:      191 pounds BMI:     22.73 O2 Sat:      97 % on Room air Temp:     97.6 degrees F oral Pulse rate:   63 / minute BP sitting:   102 / 64  (left arm) Cuff size:   regular  Vitals Entered By: Bill Salinas CMA (June 27, 2010 10:59 AM)  O2 Flow:  Room air CC: pt here for follow up office visit/ ab   Primary Care Provider:  Melo Stauber  CC:  pt here for follow up office visit/ ab.  History of Present Illness: patient presents to o btain a refill on his aricept.  In the interval since his last visit he had normal dementia lab work -up. He had a normal CT brain.   Also in the inteval he has completed 40 radiation treatments for Prostate Cancer. He is feeling well and doing well.   Current Medications (verified): 1)  Atenolol 50 Mg  Tabs (Atenolol) .... Take Once Daily 2)  Zocor 5 Mg Tabs (Simvastatin) .... Take 1 Tablet By Mouth 2 Days Per Week 3)  Coumadin 5 Mg Tabs (Warfarin Sodium) .... Take As Directed By Coumadin Clinic. 4)  Aricept 10 Mg Tabs (Donepezil Hcl) .Marland Kitchen.. 1 At Bedtime - Start After Taking 5 Mg X 1 Month  Allergies (verified): No Known Drug Allergies  Past History:  Past Medical History: ADENOCARCINOMA, PROSTATE, GLEASON GRADE 4 (ICD-185)-completed XRT Dec '11 MENTAL CONFUSION (ICD-298.9) URI (ICD-465.9) PACEMAKER, PERMANENT (ICD-V45.01) ATRIAL FIBRILLATION (ICD-427.31) OTHER ABNORMAL BLOOD CHEMISTRY (ICD-790.6) LEG PAIN, LEFT (ICD-729.5) ENCOUNTER FOR LONG-TERM USE OF OTHER MEDICATIONS (ICD-V58.69) BRADYCARDIA (ICD-427.89) HYPERTENSION (ICD-401.9) Hx of POLYP, COLON (ICD-211.3) BENIGN PROSTATIC HYPERTROPHY, HX OF (ICD-V13.8) HYPERLIPIDEMIA (ICD-272.4) ATRIAL FIBRILLATION, CHRONIC, HX OF (ICD-V12.59) * Hx of VEIN STRIPPING LASER PROCEDURE TONSILLECTOMY AND ADENOIDECTOMY, HX OF (ICD-V45.79) Hx of HEMORRHOIDS (ICD-455.6) Hx of DIVERTICULOSIS,  COLON (ICD-562.10) Previously implanted pacemaker Guidant      Past Surgical History: Reviewed history from 10/21/2007 and no changes required. PACEMAKER, PERMANENT (ICD-V45.01) * Hx of VEIN STRIPPING LASER PROCEDURE TONSILLECTOMY AND ADENOIDECTOMY, HX OF (ICD-V45.79)  Family History: Reviewed history from 07/09/2008 and no changes required. Negative FH of Diabetes, Hypertension, or Coronary Artery Disease  Social History: Reviewed history from 08/09/2009 and no changes required. 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  Tobacco Use - No.   Review of Systems GI:  Denies bloody stools, change in bowel habits, constipation, dark tarry stools, hemorrhoids, nausea, and vomiting.  Physical Exam  General:  Tall, well nourished white male in no distress Head:  normocephalic and atraumatic.   Eyes:  C&S clear Lungs:  normal respiratory effort.   Heart:  normal rate and regular rhythm.   Neurologic:  MMSE 1. Day - ok, date, year - OK 2. Current events - presidential race, [employer] [place] 3. 5 fwd; 5 rev - 1 error 4. serial 7's ok, nickles -=ok; change -ok 5. naming 0k; 4 legged - ok 6. 3 words-o/3  7. parables - slight confabulation. 8. clock face - poorly organized and time is off   Impression & Recommendations:  Problem # 1:  ADENOCARCINOMA, PROSTATE, GLEASON GRADE 4 (ICD-185) Completed XRT.He is doing well with no GI side effects. He follows with Dr. Annabell Howells  Problem # 2:  MENTAL CONFUSION (ICD-298.9) Repeat MMSE reveals no to very minimal change.  Plan - continue Aricept           Repeat MMSE in 6 months.   Complete Medication List: 1)  Atenolol 50 Mg Tabs (Atenolol) .... Take once daily 2)  Zocor 5 Mg Tabs (Simvastatin) .... Take 1 tablet by mouth 2 days per week 3)  Coumadin 5 Mg  Tabs (Warfarin sodium) .... Take as directed by coumadin clinic. 4)  Aricept 10 Mg Tabs (Donepezil hcl) .Marland Kitchen.. 1 at bedtime - start after taking 5 mg x 1 month   Orders Added: 1)  Est. Patient Level III [46962]   Immunization History:  Pneumovax Immunization History:    Pneumovax:  historical (08/21/2004)   Immunization History:  Pneumovax Immunization History:    Pneumovax:  Historical (08/21/2004)

## 2010-08-02 ENCOUNTER — Encounter: Payer: Self-pay | Admitting: Cardiovascular Disease

## 2010-08-02 ENCOUNTER — Encounter (INDEPENDENT_AMBULATORY_CARE_PROVIDER_SITE_OTHER): Payer: Medicare Other

## 2010-08-02 DIAGNOSIS — Z7901 Long term (current) use of anticoagulants: Secondary | ICD-10-CM

## 2010-08-02 DIAGNOSIS — I4891 Unspecified atrial fibrillation: Secondary | ICD-10-CM

## 2010-08-02 LAB — CONVERTED CEMR LAB: POC INR: 2.2

## 2010-08-04 NOTE — Letter (Signed)
Summary: Bjorn Pippin MD/[facility]  Bjorn Pippin MD/[facility]   Imported By: Lester Butterfield 07/28/2010 09:35:38  _____________________________________________________________________  External Attachment:    Type:   Image     Comment:   External Document

## 2010-08-10 NOTE — Medication Information (Signed)
Summary: Coumadin Clinic  Anticoagulant Therapy  Managed by: Weston Brass, PharmD Referring MD: Rollene Rotunda MD PCP: Link Snuffer MD: Eden Emms MD, Theron Arista Indication 1: Atrial Fibrillation (ICD-427.31) Lab Used: [facility] Site: [facility] INR POC 2.2 INR RANGE 2 - 3  Dietary changes: yes       Details: Cut back on his greens a little.  Health status changes: no    Bleeding/hemorrhagic complications: no    Recent/future hospitalizations: no    Any changes in medication regimen? no    Recent/future dental: no  Any missed doses?: no       Is patient compliant with meds? yes       Allergies: No Known Drug Allergies  Anticoagulation Management History:      The patient is taking warfarin and comes in today for a routine follow up visit.  Positive risk factors for bleeding include an age of 75 years or older.  The bleeding index is 'intermediate risk'.  Positive CHADS2 values include History of HTN and Age > 75 years old.  The start date was 10/08/1997.  His last INR was 1.6 RATIO.  Anticoagulation responsible provider: Eden Emms MD, Theron Arista.  INR POC: 2.2.  Cuvette Lot#: [id number].  Exp: 06/2011.    Anticoagulation Management Assessment/Plan:      The patient's current anticoagulation dose is Coumadin 5 mg tabs: Take as directed by coumadin clinic..  The target INR is 2 - 3.  The next INR is due 08/30/2010.  Anticoagulation instructions were given to patient.  Results were reviewed/authorized by Weston Brass, PharmD.  He was notified by Margot Chimes PharmD Candidate.         Prior Anticoagulation Instructions: INR 1.9  Take 1.5 tablets today, then resume same dosage 1 tablet daily.  Recheck in 4 weeks.    Current Anticoagulation Instructions: INR 2.2  Continue your current dose of 1 tablet everyday.  Recheck in 4 weeks.

## 2010-08-16 NOTE — Progress Notes (Signed)
Summary: [facility]: Office Visit  [facility]: Office Visit   Imported By: Earl Many 08/03/2010 17:08:10  _____________________________________________________________________  External Attachment:    Type:   Image     Comment:   External Document

## 2010-08-29 ENCOUNTER — Encounter: Payer: Self-pay | Admitting: Internal Medicine

## 2010-08-29 DIAGNOSIS — I4891 Unspecified atrial fibrillation: Secondary | ICD-10-CM

## 2010-08-30 ENCOUNTER — Encounter (INDEPENDENT_AMBULATORY_CARE_PROVIDER_SITE_OTHER): Payer: Medicare Other

## 2010-08-30 ENCOUNTER — Encounter: Payer: Self-pay | Admitting: Cardiovascular Disease

## 2010-08-30 ENCOUNTER — Encounter: Payer: Self-pay | Admitting: Internal Medicine

## 2010-08-30 DIAGNOSIS — I4891 Unspecified atrial fibrillation: Secondary | ICD-10-CM

## 2010-08-30 DIAGNOSIS — Z7901 Long term (current) use of anticoagulants: Secondary | ICD-10-CM

## 2010-08-30 LAB — CONVERTED CEMR LAB: POC INR: 2.2

## 2010-09-06 NOTE — Medication Information (Signed)
Summary: rov/cb  Anticoagulant Therapy  Managed by: Bayard Hugger, PharmD Referring MD: Rollene Rotunda MD PCP: Link Snuffer MD: Myrtis Ser MD, Tinnie Gens Indication 1: Atrial Fibrillation (ICD-427.31) Lab Used: [facility] Site: [facility] INR POC 2.2 INR RANGE 2 - 3  Dietary changes: no    Health status changes: no    Bleeding/hemorrhagic complications: no    Recent/future hospitalizations: no    Any changes in medication regimen? no    Recent/future dental: no  Any missed doses?: no       Is patient compliant with meds? yes       Allergies: No Known Drug Allergies  Anticoagulation Management History:      Positive risk factors for bleeding include an age of 75 years or older.  The bleeding index is 'intermediate risk'.  Positive CHADS2 values include History of HTN and Age > 42 years old.  The start date was 10/08/1997.  His last INR was 1.6 RATIO.  Anticoagulation responsible provider: Myrtis Ser MD, Tinnie Gens.  INR POC: 2.2.  Cuvette Lot#: [id number].  Exp: 08/2011.    Anticoagulation Management Assessment/Plan:      The patient's current anticoagulation dose is Coumadin 5 mg tabs: Take as directed by coumadin clinic..  The target INR is 2 - 3.  The next INR is due 09/27/2010.  Anticoagulation instructions were given to patient.  Results were reviewed/authorized by Bayard Hugger, PharmD.         Prior Anticoagulation Instructions: INR 2.2  Continue your current dose of 1 tablet everyday.  Recheck in 4 weeks.    Current Anticoagulation Instructions: INR 2.2 The patient is to continue with the same dose of coumadin.  This dosage includes:  1 tablet (5mg ) every day. Recheck INR in 4 weeks

## 2010-09-10 ENCOUNTER — Other Ambulatory Visit: Payer: Self-pay | Admitting: Cardiology

## 2010-09-13 ENCOUNTER — Encounter: Payer: Self-pay | Admitting: Internal Medicine

## 2010-09-13 DIAGNOSIS — I495 Sick sinus syndrome: Secondary | ICD-10-CM

## 2010-09-15 ENCOUNTER — Other Ambulatory Visit: Payer: Self-pay | Admitting: Pharmacist

## 2010-09-15 NOTE — Telephone Encounter (Signed)
Pt came in wanting to get his refill for his warfin. Called the pharmacy on Sunday

## 2010-09-15 NOTE — Telephone Encounter (Signed)
[address] °

## 2010-09-27 ENCOUNTER — Ambulatory Visit (INDEPENDENT_AMBULATORY_CARE_PROVIDER_SITE_OTHER): Payer: Medicare Other | Admitting: *Deleted

## 2010-09-27 DIAGNOSIS — I4891 Unspecified atrial fibrillation: Secondary | ICD-10-CM

## 2010-09-27 DIAGNOSIS — Z7901 Long term (current) use of anticoagulants: Secondary | ICD-10-CM

## 2010-09-27 LAB — POCT INR: INR: 2.7

## 2010-09-27 NOTE — Patient Instructions (Signed)
INR 2.7 Continue taking 1 tablet (5mg ) daily. Recheck in 4 weeks.

## 2010-10-25 ENCOUNTER — Ambulatory Visit (INDEPENDENT_AMBULATORY_CARE_PROVIDER_SITE_OTHER): Payer: Medicare Other | Admitting: *Deleted

## 2010-10-25 DIAGNOSIS — I4891 Unspecified atrial fibrillation: Secondary | ICD-10-CM

## 2010-11-01 NOTE — Assessment & Plan Note (Signed)
[facility]                         ELECTROPHYSIOLOGY OFFICE NOTE   NAME:[name], [name]                        MRN:          [id number]  DATE:12/31/2006                            DOB:          [birth year]    [name] is seen and he has permanent atrial fibrillation with  bradycardia, status post pacemaker implantation.  He continues to do  well.  There are no complaints of chest pain or shortness of breath.   MEDICATIONS:  Atenolol 50, Coumadin and Zocor.   PHYSICAL EXAMINATION:  VITAL SIGNS:  Today, his blood pressure was  120/74 with a pulse of 90.  LUNGS:  Clear.  HEART:  Regular.  EXTREMITIES:  Without edema.   IMPRESSION:  1. Atrial fibrillation.  2. Status post pacemaker implantation (Guidant).   PLAN:  [name] is doing well.  His pacemaker was interrogated today.  He has 43% ventricular pacing with an R-wave of 16.5, impedance 550 and  threshold of 0.9 and 0.4.  We will plan to see him again in 4 months'  time.     Duke Salvia, MD, [facility]  Electronically Signed    SCK/MedQ  DD: 12/31/2006  DT: 01/01/2007  Job #: [id number]

## 2010-11-01 NOTE — Assessment & Plan Note (Signed)
[facility]                         ELECTROPHYSIOLOGY OFFICE NOTE   NAME:SHOREBenancio, Osmundson                       MRN:          [id number]  DATE:04/21/2008                            DOB:          04-Aug-1928    Mr. Syme is seen in followup of a pacemaker inserted in the setting of  permanent atrial fibrillation of bradycardia.  He is doing well without  complaints of chest pain or shortness of breath.  His major complaint is  leg discomfort while driving and he wonders whether this is related to  his statin.  He undertook a statin experiment by limiting it for a month  that had no impact on the pain in his thighs while driving.   PHYSICAL EXAMINATION:  VITAL SIGNS:  His blood pressure is 120/72; his  pulse was 62; and his weight was 203, which is down 10 pounds in the  last 8 months.  LUNGS:  Clear.  CARDIAC:  Heart sounds were irregular.  EXTREMITIES:  No edema.   Interrogation of his Guidant Insignia pulse generator demonstrates an R-  wave of 12 with impedance of 590 and threshold 0.6 at 0.5.   IMPRESSION:  1. Atrial fibrillation - permanent.  2. Bradycardia.  3. Status post pacer for the above.  4. Leg discomfort.   Mr. Hirt is doing fine from the arrhythmia point of view.  We will see  him again in a year.  I think that his leg pain is related to his feet  in his car and we have talked about variety of mechanism to try and help  with that.     Duke Salvia, MD, [facility]  Electronically Signed    SCK/MedQ  DD: 04/21/2008  DT: 04/21/2008  Job #: [id number]

## 2010-11-01 NOTE — Assessment & Plan Note (Signed)
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  He is  status post pacemaker implantation with device generator replacement  November.  He has no complaints of chest pain, shortness of breath.   MEDICATIONS:  Include Coumadin and atenolol.   PHYSICAL EXAMINATION:  VITAL SIGNS:  Blood pressure was 122/70, pulse of  76.  LUNGS:  Clear.  HEART:  Sounds were irregular.  EXTREMITIES:  Without edema.   Interrogation of his Guidant Insignia pulse generator demonstrated  normal function with an R-wave of 12, impedance of 540, threshold 0.7  and 0.5.   IMPRESSION:  1. Tachybrady syndrome.  2. Status post pacer for the above.   PLAN:  Mr. Schmuck is doing fine.  Will see him again in nine months time.     Duke Salvia, MD, [facility]  Electronically Signed    SCK/MedQ  DD: 08/12/2007  DT: 08/12/2007  Job #: [id number]

## 2010-11-01 NOTE — Assessment & Plan Note (Signed)
[facility]                            CARDIOLOGY OFFICE NOTE   NAME:[name], [name]                        MRN:          [id number]  DATE:10/16/2006                            DOB:          1929-05-05    HISTORY OF PRESENT ILLNESS:  Mr.  [name] is a very pleasant 77 year old  white married male with chronic atrial fibrillation  nearly 9 years post  implantation of a Guidant 1171 ventricular pacemaker because of  tachybrady syndrome.   The patient has gotten along extremely well, has been asymptomatic.   He spends more than half the year in [place].  He is very active  and has no symptoms.  Does have history of hyperlipidemia and  hypertension, but no history of coronary artery disease.   MEDICATIONS:  1. Coumadin.  2. Zocor 10 mg.  3. Atenolol 50 mg.   PHYSICAL EXAMINATION:  VITAL SIGNS:  Blood pressure 119/83, pulse 65,  atrial fibrillation and V pacing on demand.  NECK:  JVP is not elevated.  Carotid pulses palpable and equal without  bruits.  LUNGS:  Clear.  CARDIAC:  No murmur or gallops.  ABDOMEN:  Normal.  EXTREMITIES:  No edema.   STUDIES:  EKG reveals atrial fibrillation with V pacing on demand at a  rate of 60.   ASSESSMENT:  He was told in the past that he may be near end-of-life  with his pacer.  This was checked and it is not end-of-life.  He calls  in monthly.  We plan to check a lipid and LFT's and will have him see  Dr. Graciela Husbands for follow up in 3-4 months.     Cecil Cranker, MD, [facility]  Electronically Signed    EJL/MedQ  DD: 10/16/2006  DT: 10/16/2006  Job #: [id number]

## 2010-11-01 NOTE — Assessment & Plan Note (Signed)
[facility]                            CARDIOLOGY OFFICE NOTE   NAME:Palecek, Marcille Blanco                        MRN:          [id number]  DATE:10/16/2006                            DOB:          05/15/1929    Mr. Sluder is very pleasant 77 year old white married male with a long  history of chronic atrial fibrillation with well controlled ventricular   INCOMPLETE DICTATION.     Cecil Cranker, MD, [facility]     EJL/MedQ  DD: 10/16/2006  DT: 10/16/2006  Job #: [id number]

## 2010-11-01 NOTE — Op Note (Signed)
NAMEFAIZAN, GERACI                ACCOUNT NO.:  [id number]   MEDICAL RECORD NO.:  [id number]          PATIENT TYPE:  OIB   LOCATION:  2899                         FACILITY:  [facility]   PHYSICIAN:  Duke Salvia, MD, FACCDATE OF BIRTH:  06-Apr-1929   DATE OF PROCEDURE:  05/07/2007  DATE OF DISCHARGE:                               OPERATIVE REPORT   PREOPERATIVE DIAGNOSIS:  Previously implanted pacemaker, now at end of  life.   POSTOPERATIVE DIAGNOSIS:  Previously implanted pacemaker, now at end of  life.   PROCEDURE:  Single chamber pacemaker explantation and reimplantation.   Following obtaining of informed consent, the patient was brought to the  electrophysiology laboratory and placed on the fluoroscopic table in the  supine position.  After routine prep and drape, lidocaine was  infiltrated along the line of the previous incision, carried down to the  layer of the device pocket.  Using sharp dissection, the pocket was  opened.  The device was explanted.  The previously implanted 13406T  serial #[id number] St. Jude pacemaker lead was interrogated with an R wave  of 18 with impedance of 570, threshold is 0.5 volts at 0.5 milliseconds.  With the acceptable parameters recorded, the lead was secured to a St.  Jude Insignia I pulse generator model 1194.  The pocket was copiously  irrigated with antibiotic containing saline solution.  The device was  fixed to the prepectoral fascia.  The wound was closed in 3 layers in a  normal fashion.  The wound was washed, dried and the benzoin and Steri-  Strip dressings applied.  Needle counts, sponge counts and instrument  counts were correct at the end of the procedure according to the staff.  The patient tolerated the procedure without apparent complication.      Duke Salvia, MD, [facility]  Electronically Signed     SCK/MEDQ  D:  05/07/2007  T:  05/07/2007  Job:  [id number]   cc:   ELECTROPHYSIOLOGY LABORATORY  [facility]

## 2010-11-01 NOTE — Discharge Summary (Signed)
[name], [name]                ACCOUNT NO.:  [id number]   MEDICAL RECORD NO.:  [id number]          PATIENT TYPE:  OIB   LOCATION:  2899                         FACILITY:  [facility]   PHYSICIAN:  Duke Salvia, MD, FACCDATE OF BIRTH:  [birth year]   DATE OF ADMISSION:  05/07/2007  DATE OF DISCHARGE:  05/07/2007                               DISCHARGE SUMMARY   ALLERGIES:  NO KNOWN DRUG ALLERGIES.   Exam and dictation greater than 30 minutes.   FINAL DIAGNOSES:  1. Pacemaker implanted 1999 for tachybrady syndrome, a Guidant number      1171 at elective replacement indicator.  2. Existing pacemaker explanted May 07, 2007 with implantation of      Guidant [place] I, plus SR model 1194 single-chamber pacemaker by      Dr. Sherryl Manges.   SECONDARY DIAGNOSES:  1. Permanent atrial fibrillation on chronic Coumadin therapy.  2. Pacemaker implanted 1999 for tachybrady syndrome.  3. Left heart catheterization May 2004 after an abnormal Myoview      study.  Ejection fraction 45%, the left anterior descending 40%.      Proximal left circumflex, no significant disease.  Right coronary      artery free of significant disease.   BRIEF HISTORY:  [name] is a 77 year old male.  He has permanent  atrial fibrillation with bradycardia.  He is status post pacemaker  implantation in 1999.  It has reached ERI.   The patient has no complaints of chest pain or shortness of breath.  He  tolerates his current medications.  Interrogation of the device shows  that the device has reached ERI.  Right ventricular lead impedance was  5500.   Generator replacement is pending.  The potential benefits and risks have  been reviewed with the patient including infection.  He understands and  would like to proceed.  In addition, LFTs will be checked as they relate  to statin therapy.   HOSPITAL COURSE:  The patient presented on May 07, 2007.  He  underwent explantation of his existing pacemaker with  implant of the  Guidant Insignia I device without complication.  Patient is discharging  the same day.   DISCHARGE INSTRUCTIONS:  1. He is asked to keep his incision dry for next the 7 days.  2. Sponge bathe until Tuesday May 14, 2007.  3. He is asked not to lift anything heavy for the next 2 weeks and not      to drive for the next 2 days.  4. He resumes his Coumadin, Zocor 10 mg daily at bedtime, atenolol 50      mg daily and multivitamin daily.  5. He follows up at [facility], [address],      Wednesday, May 22, 2007 at 9:20.   LABORATORY DATA:  Laboratory studies pertinent to this admission on the  were drawn May 06, 2007.  White cell was 6.4, hemoglobin 15.2,  hematocrit 43.5, platelets are 207.  Pro-time was 15.5, INR 1.6, sodium  140, potassium 4.5, chloride 105, carbonate 31, glucose 120, BUN 23,  creatinine  1.0. LFTs:  Alkaline phosphatase of 55, SGOT 25, SGPT 20, all  within normal limits.      Maple Mirza, [place]      Duke Salvia, MD, [facility]  Electronically Signed    GM/MEDQ  D:  05/07/2007  T:  05/07/2007  Job:  [id number]   cc:   Rosalyn Gess. Debby Bud, MD  Duke Salvia, MD, [facility]

## 2010-11-01 NOTE — Assessment & Plan Note (Signed)
[name]                         ELECTROPHYSIOLOGY OFFICE NOTE   NAME:[name], [name]                       MRN:          [id number]  DATE:04/08/2007                            DOB:          1928-12-09    [name] has got permanent atrial fibrillation with bradycardia.  He is  status post pacemaker implantation in 1999.  He has reached ERI.   He has no complaints of chest pain, shortness of breath.   He is tolerating his current medications which include Zocor, atenolol,  and Coumadin.   PHYSICAL EXAMINATION:  VITAL SIGNS:  His blood pressure was 134/76.  His  pulse was 60.  LUNGS:  Clear.  HEART:  Sounds were irregular.  EXTREMITIES:  Without edema.   Interrogation of his device via remote followup had demonstrated ERI.  In July his RV lead impedance was 550.   IMPRESSION:  1. Permanent atrial fibrillation.  2. Bradycardia.  3. Status post pacer __________  .  4. Dyslipidemia.   We will plan to undertake generator replacement.  I have reviewed with  [name] the potential benefits as well as potential risk including  infection.  He understands and would like to proceed.   In addition, we will plan to check his LFTs at the time of his  laboratories as it relates to his Statin therapy.     Duke Salvia, MD, [facility]  Electronically Signed    SCK/MedQ  DD: 04/08/2007  DT: 04/08/2007  Job #: [id number]

## 2010-11-01 NOTE — Assessment & Plan Note (Signed)
[facility]                            CARDIOLOGY OFFICE NOTE   NAME:[name], [name]                        MRN:          [id number]  DATE:10/16/2006                            DOB:          06/20/1928    [name] is a very pleasant 77 year old white married male with a long  history of chronic atrial fibrillation with well-controlled ventricular  response.   The patient  remains asymptomatic.  He is quite active and spends much  of the year in [place].  He has a ventricular demand pacemaker per  Dr. Graciela Husbands.   MEDICATIONS:  1. Coumadin.  2. Zocor 10.  3. Atenolol 50.   Recent LDL was 80.  This was repeated and is now 18.  LFTs were normal,  except for bilirubin of 1.6.   PHYSICAL EXAMINATION:  Blood pressure 119/83, pulse 65 atrial  fibrillation, controlled ventricular response.  GENERAL APPEARANCE:  Normal.  JVP is not elevated.  Carotid pulses  bilaterally equal without bruits.  LUNGS:  Clear.  CARDIOVASCULAR:  Atrial fibrillation and no murmur or gallop.  ABDOMEN:  Examination normal.   EKG was not done.  Dx: as above.  The patient is followed by Dr. Graciela Husbands, and he will have his followup per  Dr. Graciela Husbands after my retirement in June.     Cecil Cranker, MD, [facility]  Electronically Signed    EJL/MedQ  DD: 10/25/2006  DT: 10/25/2006  Job #: [id number]

## 2010-11-04 NOTE — Assessment & Plan Note (Signed)
[facility]                              CARDIOLOGY OFFICE NOTE   NAME:Chrobak, Marcille Blanco                        MRN:          [id number]  DATE:02/27/2006                            DOB:          [birth year]    HISTORY OF PRESENT ILLNESS:  Mr. Schrager is a very pleasant 77 year old white  married male with a long history of chronic atrial fibrillation with well  controlled ventricular response.  The patient remains asymptomatic.  The  patient remains active in the summer in [place].  He has a ventricular  demand pacer.   MEDICATIONS:  1. Coumadin.  2. Zocor 10 mg.  3. Atenolol 50 mg.   PHYSICAL EXAMINATION:  VITAL SIGNS:  Blood pressure 106/74, pulse 70 in  atrial fibrillation with v-pacing.  NECK:  JVP is not elevated.  Carotid pulses palpable and equal without  bruits.  LUNGS:  Clear.  CARDIAC:  Normal.  ABDOMEN:  Normal.  EXTREMITIES:  Normal.   LABORATORY DATA:  EKG reveals atrial fibrillation, controlled ventricular  response, V-pacing on demand.   IMPRESSION/DIAGNOSIS:  As above.   DISPOSITION:  We plan to recheck his lipids and liver function tests.  See  him back in six months, p.r.n.                              E. Graceann Congress, MD, [facility]    EJL/MedQ  DD:  02/27/2006  DT:  02/27/2006  Job #:  [id number]

## 2010-11-04 NOTE — Assessment & Plan Note (Signed)
[facility]                           ELECTROPHYSIOLOGY OFFICE NOTE   NAME:Heo, Marcille Blanco                        MRN:          [id number]  DATE:05/07/2006                            DOB:          08-08-1928    Mr. Cordts was seen today in the clinic on May 07, 2006, for follow-up  of his Guidant model #1171.  Date of implant was January 27, 1998, for tachy-  brady syndrome.  On interrogation of his device today, his battery voltage  has about 10% left, he is near ERI.  R waves measured 15.3 millivolts with a  ventricular capture threshold of 0.8 volts at 0.4 milliseconds and a  ventricular lead impedance of 550 ohms.  No changes were made in his  parameters today.  He is only pacing about 37% of the time and he will  continue with his monthly TTM's, now changing over to [employer] and will just  keep a close eye on his battery.      Altha Harm, LPN  Electronically Signed      Duke Salvia, MD, [name]  Electronically Signed   PO/MedQ  DD: 05/07/2006  DT: 05/07/2006  Job #: [id number]

## 2010-11-04 NOTE — Cardiovascular Report (Signed)
NAME:  [name], [name]                          ACCOUNT NO.:  [id number]   MEDICAL RECORD NO.:  [id number]                   PATIENT TYPE:  OIB   LOCATION:  2861                                 FACILITY:  [facility]   PHYSICIAN:  [name], M.D.                  DATE OF BIRTH:  05/07/1929   DATE OF PROCEDURE:  11/14/2002  DATE OF DISCHARGE:  11/14/2002                              CARDIAC CATHETERIZATION   CLINICAL HISTORY:  [name] is 77 years old and is retired from [employer] business and spends his time in [place] and at [employer].  He  has chronic atrial fibrillation.  Has a VVI pacemaker that was implanted by  [name].  Recently he had a screening Cardiolite scan done which suggested  multiple areas of ischemia and left ventricular dysfunction with an ejection  fraction of 25% although some concern that might not be reliable due to the  patient's rhythm which was intermittently atrial fibrillation and  intermittently paced.  He was scheduled for further evaluation with cardiac  catheterization.  His Coumadin had been held the last three days.   DESCRIPTION OF PROCEDURE:  The procedure was performed via the right femoral  artery using femoral artery sheath and 6-French __________ coronary  catheters.  A __________ arterial branch was performed and Omnipaque  contrast was used.  After completion of the diagnostic study, we closed the  right femoral artery with Perclose.  The patient tolerated the procedure  well and left the laboratory in satisfactory condition.   RESULTS:  Aortic pressure was 153/96 with a mean of 121.  Left ventricular  pressure was 153/16.   Left main coronary arteriogram:  The left main is free of significant  disease.   Left anterior descending artery:  The left anterior descending artery gave  rise to four septal perforators and a large and small diagonal branch.  There was some calcification of the proximal vessel and there was 40%  narrowing  in the proximal LAD after the first two septal perforators and the  first large diagonal branch.   The circumflex artery:  The circumflex was a dominant vessel, gave rise to a  large marginal branch, a small marginal branch, a posterolateral branch, a  posterior descending branch.  There was some irregularity in the posterior  descending branch but there was no significant obstruction.   Right coronary artery:  The right coronary artery is a nondominant vessel.  It is divided into a conus branch and two right ventricular branches.  These  vessels are free of significant disease.   LEFT VENTRICULOGRAM:  The left ventriculogram showed mild global  hypokinesis.  The estimated ejection fraction was 45%.   CONCLUSION:  Nonobstructive coronary artery disease with 40% narrowing in  the proximal left anterior descending, minimal irregularities in the  circumflex, no significant obstruction in the small, nondominant right  coronary  artery and mild global hypokinesis with an estimated ejection  fraction of 45%.    RECOMMENDATIONS:  In view of these findings, I suspect the recent Cardiolite  scan was a false positive and artifactual, most probably related to the  irregular rhythm and intermittently paced rhythm.  Will plan continuation of  his current medications and resume his Coumadin tomorrow with followup in  the office of [name].                                               [name], M.D.    BB/MEDQ  D:  11/14/2002  T:  11/16/2002  Job:  [id number]   cc:   [name], M.D.

## 2010-11-19 LAB — CBC AND DIFFERENTIAL
HCT: 39 % — AB (ref 41–53)
Hemoglobin: 12.9 g/dL — AB (ref 13.5–17.5)
Neutrophils Absolute: 7 /uL
Platelets: 416 10*3/uL — AB (ref 150–399)

## 2010-11-19 LAB — TSH: TSH: 2.71 u[IU]/mL (ref 0.41–5.90)

## 2010-11-19 LAB — PROTIME-INR: Protime: 25.9 seconds — AB (ref 10.0–13.8)

## 2010-11-19 LAB — POCT INR: INR: 2.5 — AB (ref 0.9–1.1)

## 2010-11-19 LAB — BASIC METABOLIC PANEL
Glucose: 91 mg/dL
Potassium: 4.2 mmol/L (ref 3.4–5.3)
Sodium: 142 mmol/L (ref 137–147)

## 2010-11-19 LAB — HEPATIC FUNCTION PANEL
AST: 29 U/L (ref 14–40)
Alkaline Phosphatase: 79 U/L (ref 25–125)

## 2010-11-21 ENCOUNTER — Telehealth: Payer: Self-pay | Admitting: Internal Medicine

## 2010-11-22 ENCOUNTER — Other Ambulatory Visit (INDEPENDENT_AMBULATORY_CARE_PROVIDER_SITE_OTHER): Payer: Medicare Other

## 2010-11-22 ENCOUNTER — Encounter: Payer: Self-pay | Admitting: Internal Medicine

## 2010-11-22 ENCOUNTER — Ambulatory Visit (INDEPENDENT_AMBULATORY_CARE_PROVIDER_SITE_OTHER): Payer: Medicare Other | Admitting: Internal Medicine

## 2010-11-22 ENCOUNTER — Ambulatory Visit (INDEPENDENT_AMBULATORY_CARE_PROVIDER_SITE_OTHER): Payer: Medicare Other | Admitting: *Deleted

## 2010-11-22 ENCOUNTER — Telehealth: Payer: Self-pay | Admitting: Internal Medicine

## 2010-11-22 VITALS — BP 112/70 | HR 59 | Temp 97.7°F | Ht 77.0 in | Wt 189.6 lb

## 2010-11-22 DIAGNOSIS — G459 Transient cerebral ischemic attack, unspecified: Secondary | ICD-10-CM

## 2010-11-22 DIAGNOSIS — I4891 Unspecified atrial fibrillation: Secondary | ICD-10-CM

## 2010-11-22 LAB — LIPID PANEL
Cholesterol: 151 mg/dL (ref 0–200)
LDL Cholesterol: 77 mg/dL (ref 0–99)

## 2010-11-22 MED ORDER — ASPIRIN EC 81 MG PO TBEC: 81.0000 mg | DELAYED_RELEASE_TABLET | Freq: Every day | ORAL | Status: DC

## 2010-11-22 NOTE — Progress Notes (Signed)
  Subjective:    Patient ID: [name], male    DOB: 1928-07-30, 77 y.o.   MRN: [id number]  HPI  Here for hosp follow up - seen 11/20/10 at [facility] in [place] this weekend for TIA symptoms - transient L hand weakness/numbness Episode onset suddenly, lasted 10-72min, then sudden and complete resolution No recurrence or residual weakness/numbness since then No HA, vision change No neck pain, shoulder pain or hand pain No overuse or recent injury/trauma No change in meds - takes all as rx'd Remotely on low dose statin but has not taken statin in "years" per pt/spouse Head ct and labs in ER all normal - dc'd for OP follow up   Past Medical History  Diagnosis Date  . Atrial fibrillation     chronic anticoag  . BENIGN PROSTATIC HYPERTROPHY, HX OF   . PACEMAKER, PERMANENT   . ADENOCARCINOMA, PROSTATE, GLEASON GRADE 4 dx 2011    wrenn  . DIVERTICULOSIS, COLON   . Dementia   . HYPERTENSION   . HYPERLIPIDEMIA    Review of Systems  Constitutional: Negative for fever.  Respiratory: Negative for shortness of breath.   Cardiovascular: Negative for chest pain and palpitations.  Neurological: Negative for headaches.       Objective:   Physical Exam BP 112/70  Pulse 59  Temp(Src) 97.7 F (36.5 C) (Oral)  Ht 6\' 5"  (1.956 m)  Wt 189 lb 9.6 oz (86.002 kg)  BMI 22.48 kg/m2  SpO2 97%  Physical Exam  Constitutional:  oriented to person, place, and time. appears well-developed and well-nourished. No distress. Wife at side Neck: Normal range of motion. Neck supple. No JVD present. No thyromegaly present.  Cardiovascular: Normal rate, regular rhythm and normal heart sounds.  No murmur heard. Pulmonary/Chest: Effort normal and breath sounds normal. No respiratory distress. no wheezes.  Neurological: he is alert and oriented to person, place, and time. No cranial nerve deficit. Coordination normal. Strength 5/5, symmetric and equal hand grip. Psychiatric: he has a normal mood and  affect. behavior is normal. Judgment and thought content normal.   Lab Results  Component Value Date   WBC 6.4 05/06/2007   HGB 15.2 05/06/2007   HCT 43.5 05/06/2007   PLT 207 05/06/2007   CHOL 151 11/22/2010   TRIG 63.0 11/22/2010   HDL 61.20 11/22/2010   ALT 15 12/15/2008   AST 20 12/15/2008   NA 142 02/24/2010   K 4.5 02/24/2010   CL 104 02/24/2010   CREATININE 1.0 02/24/2010   BUN 23 02/24/2010   CO2 30 02/24/2010   TSH 2.10 02/24/2010   INR 2.5 11/22/2010   HGBA1C 6.0 01/15/2008        Assessment & Plan:  TIA 11/20/10 - transient weakness of L hand - eval in [place] for same ([facility]) reports reviewed: normal head CT (no MRI due to PPM), Paced AF on EKG, normal labs including therapeutic INR 2.6 - already on therapeutic anticoag - will add low dose ASA for antiplt pending further eval - check Echo and carotids - consider resuming low dose statin as prev rx'd but check lab level before resuming same. BP controlled. To go to ER here if recurrent events - follow up PCP in 1-2 weeks to review results and med mgmt  Time spent with pt/family today 30 minutes, greater than 50% time spent counseling patient on TIA symptoms and medication review. Also review of prior records at [facility] from 11/20/10 ER visit

## 2010-11-22 NOTE — Patient Instructions (Signed)
It was good to see you today. We have reviewed your prior records including labs and tests from [facility] today Start baby aspirin 81mg  daily until further testing complete - take this in addition to coumadin Test(s) ordered today. Your results will be called to you after review (48-72hours after test completion). If any changes need to be made, you will be notified at that time. we'll make referral for carotids and Echocardiogram (ultrasounds of neck and heart). Our office will contact you regarding appointment(s) once made. Please schedule followup in 1-2 weeks with Dr. Debby Bud, call sooner if problems.

## 2010-11-22 NOTE — Telephone Encounter (Addendum)
Pt signed ROI,records received from [facility]/Faxed to [facility]/Primary Care @ [phone] for appt with Tawni Pummel @ 10:30am  11/22/10/km  Records received from [facility]  Faxed to [facility] @ Primary Care 11/22/10/km

## 2010-11-27 ENCOUNTER — Other Ambulatory Visit: Payer: Self-pay | Admitting: Internal Medicine

## 2010-11-30 ENCOUNTER — Encounter (INDEPENDENT_AMBULATORY_CARE_PROVIDER_SITE_OTHER): Payer: Medicare Other | Admitting: *Deleted

## 2010-11-30 ENCOUNTER — Ambulatory Visit (HOSPITAL_COMMUNITY): Payer: Medicare Other | Attending: Internal Medicine | Admitting: Radiology

## 2010-11-30 ENCOUNTER — Other Ambulatory Visit: Payer: Self-pay | Admitting: Internal Medicine

## 2010-11-30 DIAGNOSIS — I4891 Unspecified atrial fibrillation: Secondary | ICD-10-CM | POA: Insufficient documentation

## 2010-11-30 DIAGNOSIS — G459 Transient cerebral ischemic attack, unspecified: Secondary | ICD-10-CM

## 2010-11-30 DIAGNOSIS — I079 Rheumatic tricuspid valve disease, unspecified: Secondary | ICD-10-CM | POA: Insufficient documentation

## 2010-11-30 DIAGNOSIS — I10 Essential (primary) hypertension: Secondary | ICD-10-CM | POA: Insufficient documentation

## 2010-11-30 DIAGNOSIS — I059 Rheumatic mitral valve disease, unspecified: Secondary | ICD-10-CM | POA: Insufficient documentation

## 2010-12-02 ENCOUNTER — Encounter: Payer: Self-pay | Admitting: Internal Medicine

## 2010-12-05 ENCOUNTER — Encounter: Payer: Self-pay | Admitting: Internal Medicine

## 2010-12-06 ENCOUNTER — Ambulatory Visit (INDEPENDENT_AMBULATORY_CARE_PROVIDER_SITE_OTHER): Payer: Medicare Other | Admitting: Internal Medicine

## 2010-12-06 ENCOUNTER — Encounter: Payer: Self-pay | Admitting: Internal Medicine

## 2010-12-06 DIAGNOSIS — F29 Unspecified psychosis not due to a substance or known physiological condition: Secondary | ICD-10-CM

## 2010-12-06 NOTE — Patient Instructions (Signed)
In regard to your evaluation for the transient change in consciousness June 3rd:  2 D echo was normal with no structural abnormalities, an ejection fraction (juice per squeeze) of 55% with normal being 60% and no wall motion abnormalities and no sign of any clots that could cause mischief.  Carotid Doppler revealed normal carotid arteries with no blockage.  Plan - is to continue warfarin and low dose Aspirin. You look great and there is no evidence of any lasting injury.

## 2010-12-07 ENCOUNTER — Encounter: Payer: Medicare Other | Admitting: Cardiology

## 2010-12-07 NOTE — Assessment & Plan Note (Signed)
[name] with recent episode of left hand weakness and paresthesia with a full recovery. Work-up is negative for cardiac source of emboli or carotid disease. He continues on full anti-coagulation regimen.  Plan - no further work-up at this time           Continue present regimen of medication including low dose ASA.

## 2010-12-07 NOTE — Progress Notes (Signed)
  Subjective:    Patient ID: [name], male    DOB: 19-May-1929, 77 y.o.   MRN: [id number]  HPI [name] expere[place]" [place]. He was seen aand evaluated at [facility] - records reviewed: nl labs and CT brain. He was seen June 5th by Dr. Felicity Coyer where he was neurologically stable with complete resolution of his symptoms. 2 D echo order at that visit revealed an EF 55%, no wall motion abnormality and no intra-cardiac clot. Carotid doppler ordered that visit was negative for any obstructive disease. He has continued on warfarin and has adhered to an aspirin regimen as prescribed June 5th. He has been feeling well and doing well.  PMH, FamHx and SocHx reviewed for any changes and relevance.    Review of Systems Review of Systems  Constitutional:  Negative for fever, chills, activity change and unexpected weight change.  HEENT:  Negative for hearing loss, ear pain, congestion, neck stiffness and postnasal drip. Negative for sore throat or swallowing problems. Negative for dental complaints.   Eyes: Negative for vision loss or change in visual acuity.  Respiratory: Negative for chest tightness and wheezing.   Cardiovascular: Negative for chest pain and palpitationNo decreased exercise tolerance Gastrointestinal: No change in bowel habit. No bloating or gas. No reflux or indigestion Genitourinary: Negative for urgency, frequency, flank pain and difficulty urinating.  Musculoskeletal: Negative for myalgias, back pain, arthralgias and gait problem.  Neurological: Negative for dizziness, tremors, weakness and headaches.  Hematological: Negative for adenopathy.  Psychiatric/Behavioral: Negative for behavioral problems and dysphoric mood.       Objective:   Physical Exam Vitals noted - stable Gen'l - a tall, thin white male in no distress HEENT - C&S clear Pul - normal respirations Cor - 2+ peripheral pulse and normal exam with rate controlled a.  fib. Neuro - A&O x 3, CN II-XII grossly intact, MS 5/5 throughout, no gait       Assessment & Plan:

## 2010-12-20 ENCOUNTER — Ambulatory Visit (INDEPENDENT_AMBULATORY_CARE_PROVIDER_SITE_OTHER): Payer: Medicare Other | Admitting: *Deleted

## 2010-12-20 DIAGNOSIS — I4891 Unspecified atrial fibrillation: Secondary | ICD-10-CM

## 2010-12-20 LAB — POCT INR: INR: 2.2

## 2010-12-26 ENCOUNTER — Ambulatory Visit (INDEPENDENT_AMBULATORY_CARE_PROVIDER_SITE_OTHER): Payer: Medicare Other | Admitting: *Deleted

## 2010-12-26 DIAGNOSIS — I4891 Unspecified atrial fibrillation: Secondary | ICD-10-CM

## 2010-12-26 DIAGNOSIS — Z950 Presence of cardiac pacemaker: Secondary | ICD-10-CM

## 2010-12-26 DIAGNOSIS — I498 Other specified cardiac arrhythmias: Secondary | ICD-10-CM

## 2010-12-26 LAB — PACEMAKER DEVICE OBSERVATION
BMOD-0004RV: 2
BRDY-0002RV: 60 {beats}/min
RV LEAD THRESHOLD: 0.7 V

## 2010-12-26 NOTE — Progress Notes (Signed)
Pacer check in clinic  

## 2011-01-17 ENCOUNTER — Ambulatory Visit (INDEPENDENT_AMBULATORY_CARE_PROVIDER_SITE_OTHER): Payer: Medicare Other | Admitting: *Deleted

## 2011-01-17 DIAGNOSIS — I4891 Unspecified atrial fibrillation: Secondary | ICD-10-CM

## 2011-01-25 ENCOUNTER — Encounter: Payer: Self-pay | Admitting: Gastroenterology

## 2011-01-27 ENCOUNTER — Encounter: Payer: Self-pay | Admitting: Gastroenterology

## 2011-01-31 ENCOUNTER — Encounter: Payer: Self-pay | Admitting: Gastroenterology

## 2011-02-02 ENCOUNTER — Other Ambulatory Visit: Payer: Self-pay | Admitting: *Deleted

## 2011-02-02 MED ORDER — WARFARIN SODIUM 5 MG PO TABS: 5.0000 mg | ORAL_TABLET | ORAL | Status: DC

## 2011-02-14 ENCOUNTER — Ambulatory Visit (INDEPENDENT_AMBULATORY_CARE_PROVIDER_SITE_OTHER): Payer: Medicare Other | Admitting: *Deleted

## 2011-02-14 DIAGNOSIS — I4891 Unspecified atrial fibrillation: Secondary | ICD-10-CM

## 2011-02-14 LAB — POCT INR: INR: 2.4

## 2011-03-06 ENCOUNTER — Other Ambulatory Visit: Payer: Self-pay | Admitting: Internal Medicine

## 2011-03-06 ENCOUNTER — Other Ambulatory Visit: Payer: Self-pay | Admitting: Cardiology

## 2011-03-14 ENCOUNTER — Ambulatory Visit (INDEPENDENT_AMBULATORY_CARE_PROVIDER_SITE_OTHER): Payer: Medicare Other | Admitting: *Deleted

## 2011-03-14 DIAGNOSIS — I4891 Unspecified atrial fibrillation: Secondary | ICD-10-CM

## 2011-03-28 ENCOUNTER — Encounter: Payer: Self-pay | Admitting: Internal Medicine

## 2011-03-28 DIAGNOSIS — I459 Conduction disorder, unspecified: Secondary | ICD-10-CM

## 2011-04-03 ENCOUNTER — Ambulatory Visit: Payer: Medicare Other | Admitting: Gastroenterology

## 2011-04-11 ENCOUNTER — Ambulatory Visit (INDEPENDENT_AMBULATORY_CARE_PROVIDER_SITE_OTHER): Payer: Medicare Other | Admitting: *Deleted

## 2011-04-11 DIAGNOSIS — I4891 Unspecified atrial fibrillation: Secondary | ICD-10-CM

## 2011-05-23 ENCOUNTER — Ambulatory Visit (INDEPENDENT_AMBULATORY_CARE_PROVIDER_SITE_OTHER): Payer: Medicare Other | Admitting: *Deleted

## 2011-05-23 DIAGNOSIS — I4891 Unspecified atrial fibrillation: Secondary | ICD-10-CM

## 2011-05-23 LAB — POCT INR: INR: 3.1

## 2011-05-24 ENCOUNTER — Other Ambulatory Visit: Payer: Self-pay | Admitting: Cardiology

## 2011-05-29 ENCOUNTER — Encounter: Payer: Self-pay | Admitting: Internal Medicine

## 2011-05-29 ENCOUNTER — Ambulatory Visit (INDEPENDENT_AMBULATORY_CARE_PROVIDER_SITE_OTHER): Payer: Medicare Other | Admitting: Internal Medicine

## 2011-05-29 VITALS — BP 114/62 | HR 58 | Temp 97.6°F | Wt 191.0 lb

## 2011-05-29 DIAGNOSIS — F0390 Unspecified dementia without behavioral disturbance: Secondary | ICD-10-CM

## 2011-05-29 DIAGNOSIS — Z23 Encounter for immunization: Secondary | ICD-10-CM

## 2011-05-29 MED ORDER — MEMANTINE HCL 10 MG PO TABS: 10.0000 mg | ORAL_TABLET | Freq: Two times a day (BID) | ORAL | Status: DC

## 2011-05-29 NOTE — Patient Instructions (Signed)
Memory and cognitive function - the mini-mental status exam does reveal a mild progression of memory loss and cognitive decline.  Plan - Namenda titration pak as directed and when complete a new Rx (already sent to pharmacy ) for Namenda 10 mg AM and PM. Continue to take the Aricept once a day.  Please continue all your other medications.

## 2011-05-29 NOTE — Progress Notes (Signed)
  Subjective:    Patient ID: [name], male    DOB: 1929-01-12, 77 y.o.   MRN: [id number]  HPI [name] presents for 6 month follow up. He had a transient neurologic event in June '12 [facility]. He has had a very thorough and complete evaluation that was negative. In the interval her has been doing well with no recurrent neurologic symptoms. He does continue to take Aricept. His wife reports that his memory and cognitive function may have progressed a little bit and he has trouble with mechanical function, i.e. Operating the remote key lock.  I have reviewed the patient's medical history in detail and updated the computerized patient record.    Review of Systems Constitutional:  Negative for fever, chills, activity change and unexpected weight change.  HEENT:  Negative for hearing loss, ear pain, congestion, neck stiffness and postnasal drip. Negative for sore throat or swallowing problems. Negative for dental complaints.   Eyes: Negative for vision loss or change in visual acuity.  Respiratory: Negative for chest tightness and wheezing. Negative for DOE.   Cardiovascular: Negative for chest pain or palpitations. No decreased exercise tolerance Gastrointestinal: No change in bowel habit. No bloating or gas. No reflux or indigestion Genitourinary: Negative for urgency, frequency, flank pain and difficulty urinating.  Musculoskeletal: Negative for myalgias, back pain, arthralgias and gait problem.  Neurological: Negative for dizziness, tremors, weakness and headaches.  Hematological: Negative for adenopathy.  Psychiatric/Behavioral: Negative for behavioral problems and dysphoric mood.       Objective:   Physical Exam Vitals - stable gen'l - tall and lean older white male with a very positive affect in no distress Pulm - normal respirations. Neuro - Awake and alert, CN II-XII grossly normal, normal strength, normal balance/gait MMSE: 1. Day,date,year - no, ok,  difficult with year 2. Content: president- ok   Gov. - no   Current events - poor. 3. Number repitition: 5 fwd -  ok  5 rev -   1 error  World reversed - difficult 4. 3 word recall -  2/3 5. Serial 7's -  ok, nickles in $1.25-  ok  Change making - error 6. Naming objects -  Could not name laptop  4 legged creatures - ok 7. Parables:  Glass [place] -  Slow   Rolling stone - concrete 8. Judgement:  Letter ok      Fire ok 9. Clock face exercise       Assessment & Plan:

## 2011-05-30 NOTE — Assessment & Plan Note (Signed)
Interval MMSE reveals notable decline in performance Jan '12 to Dec '12  Plan - continue aricept           Add namenda - beginning with a titration pak to full dose of 10 mg bid.            Repeat MMSE in 6 months

## 2011-07-04 ENCOUNTER — Encounter: Payer: Self-pay | Admitting: Internal Medicine

## 2011-07-04 ENCOUNTER — Ambulatory Visit (INDEPENDENT_AMBULATORY_CARE_PROVIDER_SITE_OTHER): Payer: Medicare Other | Admitting: *Deleted

## 2011-07-04 ENCOUNTER — Ambulatory Visit (INDEPENDENT_AMBULATORY_CARE_PROVIDER_SITE_OTHER): Payer: Medicare Other | Admitting: Internal Medicine

## 2011-07-04 DIAGNOSIS — I498 Other specified cardiac arrhythmias: Secondary | ICD-10-CM

## 2011-07-04 DIAGNOSIS — Z950 Presence of cardiac pacemaker: Secondary | ICD-10-CM

## 2011-07-04 DIAGNOSIS — I4891 Unspecified atrial fibrillation: Secondary | ICD-10-CM

## 2011-07-04 LAB — PACEMAKER DEVICE OBSERVATION
BMOD-0002RV: 8
BRDY-0004RV: 130 {beats}/min
DEVICE MODEL PM: 145732
RV LEAD THRESHOLD: 0.7 V

## 2011-07-04 LAB — POCT INR: INR: 1.8

## 2011-07-04 NOTE — Assessment & Plan Note (Signed)
The patient's device was interrogated.  The information was reviewed. No changes were made in the programming.    

## 2011-07-04 NOTE — Assessment & Plan Note (Signed)
Stable post pacing 

## 2011-07-04 NOTE — Progress Notes (Signed)
  HPI  [name] is a 78 y.o. male  in followup for atrial fibrillation with bradycardia. He is status post pacemaker implantation and is having no associated symptoms.     Past Medical History  Diagnosis Date  . Recurrent prostate adenocarcinoma   . Unspecified psychosis   . Acute upper respiratory infections of unspecified site   . Cardiac pacemaker in situ   . Atrial fibrillation   . Other abnormal blood chemistry   . Pain in limb   . Encounter for long-term (current) use of other medications   . Orthodromic reciprocating tachycardia   . Unspecified essential hypertension   . Benign neoplasm of colon   . BPH (benign prostatic hypertrophy)   . Other and unspecified hyperlipidemia   . Personal history of other diseases of circulatory system   . Other acquired absence of organ   . Unspecified hemorrhoids without mention of complication   . Diverticulosis of colon (without mention of hemorrhage)   . Pacemaker   . History of vein stripping     Past Surgical History  Procedure Date  . Pacemaker placement 1999, replaced 2008    hx tachy-brady  . Vein stripping laser   . Tonsillectomy and adenoidectomy     Current Outpatient Prescriptions  Medication Sig Dispense Refill  . aspirin EC 81 MG tablet Take 1 tablet (81 mg total) by mouth daily.  150 tablet  2  . atenolol (TENORMIN) 50 MG tablet TAKE ONE TABLET BY MOUTH EVERY DAY  30 tablet  6  . donepezil (ARICEPT) 10 MG tablet TAKE ONE TABLET BY MOUTH AT BEDTIME.  90 tablet  1  . memantine (NAMENDA) 10 MG tablet Take 1 tablet (10 mg total) by mouth 2 (two) times daily.  60 tablet  11  . warfarin (COUMADIN) 5 MG tablet TAKE AS DIRECTED BY ANTICOAGULATION CLINIC  90 tablet  1    No Known Allergies  Review of Systems negative except from HPI and PMH  Physical Exam BP 110/80  Pulse 96  Ht 6\' 5"  (1.956 m)  Wt 185 lb (83.915 kg)  BMI 21.94 kg/m2 Well developed and well nourished in no acute distress HENT normal E  scleral and icterus clear Neck Supple JVP flat; carotids brisk and full Clear to ausculation irregularly irregular ra te and rhythm, no murmurs gallops or rub Soft with active bowel sounds No clubbing cyanosis none Edema Alert and oriented, grossly normal motor and sensory function Skin Warm and Dry   Assessment and  Plan

## 2011-07-04 NOTE — Patient Instructions (Signed)
Consistency with leafy green vegetables 

## 2011-07-04 NOTE — Patient Instructions (Signed)
Your physician has recommended you make the following change in your medication:  1) Stop Aspirin.  Your physician wants you to follow-up in: 1 year with Dr. Klein. You will receive a reminder letter in the mail two months in advance. If you don't receive a letter, please call our office to schedule the follow-up appointment.  

## 2011-07-04 NOTE — Assessment & Plan Note (Signed)
Permanent. We will discontinue his aspirin. He is on warfarin and tolerated it.

## 2011-07-09 ENCOUNTER — Encounter (HOSPITAL_COMMUNITY): Payer: Self-pay | Admitting: *Deleted

## 2011-07-09 ENCOUNTER — Emergency Department (HOSPITAL_COMMUNITY): Payer: Medicare Other

## 2011-07-09 ENCOUNTER — Inpatient Hospital Stay (HOSPITAL_COMMUNITY): Payer: Medicare Other

## 2011-07-09 ENCOUNTER — Inpatient Hospital Stay (HOSPITAL_COMMUNITY)
Admission: EM | Admit: 2011-07-09 | Discharge: 2011-07-10 | DRG: 069 | Disposition: A | Discharge status: Home / Self Care | Payer: Medicare Other | Attending: Internal Medicine | Admitting: Internal Medicine

## 2011-07-09 ENCOUNTER — Other Ambulatory Visit: Payer: Self-pay

## 2011-07-09 DIAGNOSIS — Z950 Presence of cardiac pacemaker: Secondary | ICD-10-CM | POA: Diagnosis present

## 2011-07-09 DIAGNOSIS — D72829 Elevated white blood cell count, unspecified: Secondary | ICD-10-CM | POA: Diagnosis present

## 2011-07-09 DIAGNOSIS — D473 Essential (hemorrhagic) thrombocythemia: Secondary | ICD-10-CM | POA: Diagnosis present

## 2011-07-09 DIAGNOSIS — I4891 Unspecified atrial fibrillation: Secondary | ICD-10-CM | POA: Diagnosis present

## 2011-07-09 DIAGNOSIS — G459 Transient cerebral ischemic attack, unspecified: Principal | ICD-10-CM | POA: Diagnosis present

## 2011-07-09 DIAGNOSIS — D649 Anemia, unspecified: Secondary | ICD-10-CM | POA: Diagnosis present

## 2011-07-09 DIAGNOSIS — D75839 Thrombocytosis, unspecified: Secondary | ICD-10-CM | POA: Diagnosis present

## 2011-07-09 DIAGNOSIS — R739 Hyperglycemia, unspecified: Secondary | ICD-10-CM | POA: Diagnosis present

## 2011-07-09 DIAGNOSIS — E785 Hyperlipidemia, unspecified: Secondary | ICD-10-CM | POA: Diagnosis present

## 2011-07-09 DIAGNOSIS — I10 Essential (primary) hypertension: Secondary | ICD-10-CM | POA: Diagnosis present

## 2011-07-09 DIAGNOSIS — F0390 Unspecified dementia without behavioral disturbance: Secondary | ICD-10-CM | POA: Diagnosis present

## 2011-07-09 HISTORY — DX: Essential (hemorrhagic) thrombocythemia: D47.3

## 2011-07-09 HISTORY — DX: Transient cerebral ischemic attack, unspecified: G45.9

## 2011-07-09 HISTORY — DX: Anemia, unspecified: D64.9

## 2011-07-09 HISTORY — DX: Thrombocytosis, unspecified: D75.839

## 2011-07-09 LAB — BASIC METABOLIC PANEL
BUN: 22 mg/dL (ref 6–23)
CO2: 27 mEq/L (ref 19–32)
Chloride: 103 mEq/L (ref 96–112)
Glucose, Bld: 148 mg/dL — ABNORMAL HIGH (ref 70–99)
Potassium: 3.6 mEq/L (ref 3.5–5.1)

## 2011-07-09 LAB — URINALYSIS, ROUTINE W REFLEX MICROSCOPIC
Ketones, ur: NEGATIVE mg/dL
Leukocytes, UA: NEGATIVE
Nitrite: NEGATIVE
Protein, ur: NEGATIVE mg/dL
Specific Gravity, Urine: 1.025 (ref 1.005–1.030)
Urobilinogen, UA: 0.2 mg/dL (ref 0.0–1.0)

## 2011-07-09 LAB — DIFFERENTIAL
Basophils Absolute: 0.1 10*3/uL (ref 0.0–0.1)
Eosinophils Absolute: 0.3 10*3/uL (ref 0.0–0.7)
Lymphocytes Relative: 7 % — ABNORMAL LOW (ref 12–46)
Monocytes Absolute: 1.4 10*3/uL — ABNORMAL HIGH (ref 0.1–1.0)
Neutro Abs: 10.1 10*3/uL — ABNORMAL HIGH (ref 1.7–7.7)

## 2011-07-09 LAB — COMPREHENSIVE METABOLIC PANEL
Albumin: 3.8 g/dL (ref 3.5–5.2)
BUN: 20 mg/dL (ref 6–23)
Creatinine, Ser: 0.91 mg/dL (ref 0.50–1.35)
GFR calc Af Amer: >90 mL/min (ref >90)
Total Protein: 6.3 g/dL (ref 6.0–8.3)

## 2011-07-09 LAB — CBC
HCT: 37.5 % — ABNORMAL LOW (ref 39.0–52.0)
MCH: 32.2 pg (ref 26.0–34.0)
MCHC: 33.3 g/dL (ref 30.0–36.0)
RDW: 23.7 % — ABNORMAL HIGH (ref 11.5–15.5)

## 2011-07-09 LAB — PROTIME-INR: Prothrombin Time: 23.6 seconds — ABNORMAL HIGH (ref 11.6–15.2)

## 2011-07-09 MED ORDER — DONEPEZIL HCL 10 MG PO TABS
10.0000 mg | ORAL_TABLET | Freq: Every day | ORAL | Status: DC
  Administered 2011-07-09: 10 mg via ORAL
  Filled 2011-07-09 (×2): qty 1

## 2011-07-09 MED ORDER — SODIUM CHLORIDE 0.9 % IV SOLN
Freq: Once | INTRAVENOUS | Status: AC
  Administered 2011-07-09: 10:00:00 via INTRAVENOUS

## 2011-07-09 MED ORDER — ATENOLOL 50 MG PO TABS
50.0000 mg | ORAL_TABLET | Freq: Every day | ORAL | Status: DC
  Administered 2011-07-09 – 2011-07-10 (×2): 50 mg via ORAL
  Filled 2011-07-09 (×2): qty 1

## 2011-07-09 MED ORDER — ASPIRIN 300 MG RE SUPP
300.0000 mg | Freq: Every day | RECTAL | Status: DC
  Filled 2011-07-09 (×2): qty 1

## 2011-07-09 MED ORDER — ACETAMINOPHEN 325 MG PO TABS: 650.0000 mg | ORAL_TABLET | ORAL | Status: DC | PRN

## 2011-07-09 MED ORDER — MEMANTINE HCL 10 MG PO TABS
10.0000 mg | ORAL_TABLET | Freq: Two times a day (BID) | ORAL | Status: DC
  Administered 2011-07-09 – 2011-07-10 (×2): 10 mg via ORAL
  Filled 2011-07-09 (×3): qty 1

## 2011-07-09 MED ORDER — ACETAMINOPHEN 650 MG RE SUPP: 650.0000 mg | RECTAL | Status: DC | PRN

## 2011-07-09 MED ORDER — SODIUM CHLORIDE 0.9 % IV SOLN: 250.0000 mL | INTRAVENOUS | Status: DC | PRN

## 2011-07-09 MED ORDER — SENNOSIDES-DOCUSATE SODIUM 8.6-50 MG PO TABS
1.0000 | ORAL_TABLET | Freq: Every evening | ORAL | Status: DC | PRN
  Filled 2011-07-09: qty 1

## 2011-07-09 MED ORDER — WARFARIN SODIUM 5 MG PO TABS
5.0000 mg | ORAL_TABLET | Freq: Every day | ORAL | Status: DC
  Administered 2011-07-09: 5 mg via ORAL
  Filled 2011-07-09 (×2): qty 1

## 2011-07-09 MED ORDER — SODIUM CHLORIDE 0.9 % IJ SOLN
3.0000 mL | Freq: Two times a day (BID) | INTRAMUSCULAR | Status: DC
  Administered 2011-07-09: 3 mL via INTRAVENOUS

## 2011-07-09 MED ORDER — ASPIRIN 325 MG PO TABS
325.0000 mg | ORAL_TABLET | Freq: Every day | ORAL | Status: DC
  Administered 2011-07-09: 325 mg via ORAL
  Filled 2011-07-09 (×2): qty 1

## 2011-07-09 MED ORDER — PNEUMOCOCCAL VAC POLYVALENT 25 MCG/0.5ML IJ INJ
0.5000 mL | INJECTION | INTRAMUSCULAR | Status: AC
  Administered 2011-07-10: 0.5 mL via INTRAMUSCULAR
  Filled 2011-07-09: qty 0.5

## 2011-07-09 MED ORDER — SODIUM CHLORIDE 0.9 % IJ SOLN: 3.0000 mL | INTRAMUSCULAR | Status: DC | PRN

## 2011-07-09 NOTE — ED Notes (Signed)
Patient arrived by private vehicle.  Patient is alert and oriented x3.   Patient is complaining of left hand weakness, numbness and tingling Patient symptoms started yesterday  Patient does have a history of TIA

## 2011-07-09 NOTE — ED Provider Notes (Signed)
History     CSN: [id number]  Arrival date & time 07/09/11  4098   First MD Initiated Contact with Patient 07/09/11 [phone]      No chief complaint on file.   (Consider location/radiation/quality/duration/timing/severity/associated sxs/prior treatment) The history is provided by the patient.   patient here with left hand and forearm numbness that started yesterday. Patient's episode yesterday lasting for possibly 30 minutes numbness paresthesias started on his left hand extending up to his forearm. Symptoms resolved. This patient's symptoms returned again today with left hand numbness and paresthesias extending to his forearm. No associated headache, confusion, slurred speech, lower extremity weakness, facial drooping. Nothing makes the symptoms better or worse. No recent history of repetitive arm use. History of similar symptoms in the past that were associated with TIAs. Patient's current symptoms are improving and he is almost back to his baseline  Past Medical History  Diagnosis Date  . Recurrent prostate adenocarcinoma   . Unspecified psychosis   . Cardiac pacemaker [employer]   . Atrial fibrillation   . Pain in limb   . Encounter for long-term (current) use of other medications   . Unspecified essential hypertension   . Benign neoplasm of colon   . BPH (benign prostatic hypertrophy)   . Other and unspecified hyperlipidemia   . Other acquired absence of organ   . Unspecified hemorrhoids without mention of complication   . Diverticulosis of colon (without mention of hemorrhage)   . History of vein stripping     Past Surgical History  Procedure Date  . Pacemaker placement 1999, replaced 2008    hx tachy-brady  . Vein stripping laser   . Tonsillectomy and adenoidectomy     Family History  Problem Relation Age of Onset  . Diabetes Neg Hx   . Hypertension Neg Hx   . Coronary artery disease Neg Hx     History  Substance Use Topics  . Smoking status: Never Smoker     . Smokeless tobacco: Not on file  . Alcohol Use: Yes     beer only 3 cans a week      Review of Systems  All other systems reviewed and are negative.    Allergies  Review of patient's allergies indicates no known allergies.  Home Medications   Current Outpatient Rx  Name Route Sig Dispense Refill  . ATENOLOL 50 MG PO TABS  TAKE ONE TABLET BY MOUTH EVERY DAY 30 tablet 6  . DONEPEZIL HCL 10 MG PO TABS  TAKE ONE TABLET BY MOUTH AT BEDTIME. 90 tablet 1  . MEMANTINE HCL 10 MG PO TABS Oral Take 1 tablet (10 mg total) by mouth 2 (two) times daily. 60 tablet 11  . WARFARIN SODIUM 5 MG PO TABS  TAKE AS DIRECTED BY ANTICOAGULATION CLINIC 90 tablet 1    BP 127/69  Pulse 61  Temp(Src) 97.8 F (36.6 C) (Oral)  Resp 18  SpO2 100%  Physical Exam  Nursing note and vitals reviewed. Constitutional: He is oriented to person, place, and time. He appears well-developed and well-nourished.  Non-toxic appearance. No distress.  HENT:  Head: Normocephalic and atraumatic.  Eyes: Conjunctivae, EOM and lids are normal. Pupils are equal, round, and reactive to light.  Neck: Normal range of motion. Neck supple. No tracheal deviation present. No mass present.  Cardiovascular: Normal rate, regular rhythm and normal heart sounds.  Exam reveals no gallop.   No murmur heard. Pulmonary/Chest: Effort normal and breath sounds normal. No stridor. No respiratory  distress. He has no decreased breath sounds. He has no wheezes. He has no rhonchi. He has no rales.  Abdominal: Soft. Normal appearance and bowel sounds are normal. He exhibits no distension. There is no tenderness. There is no rebound and no CVA tenderness.  Musculoskeletal: Normal range of motion. He exhibits no edema and no tenderness.  Neurological: He is alert and oriented to person, place, and time. He has normal strength. No cranial nerve deficit or sensory deficit. GCS eye subscore is 4. GCS verbal subscore is 5. GCS motor subscore is 6.   Skin: Skin is warm and dry. No abrasion and no rash noted.  Psychiatric: He has a normal mood and affect. His speech is normal and behavior is normal.    ED Course  Procedures (including critical care time)  Labs Reviewed - No data to display No results found.   No diagnosis found.    MDM   Date: 07/09/2011  Rate: 60  Rhythm: atrial fibrillation  QRS Axis: normal  Intervals: normal  ST/T Wave abnormalities: nonspecific ST/T changes  Conduction Disutrbances:atrial fib  Narrative Interpretation:   Old EKG Reviewed: unchanged  11:09 AM Pt seen by neurology, recommend admission for cva workup          Toy Baker, MD 07/09/11 1109

## 2011-07-09 NOTE — Consult Note (Addendum)
Reason for Consult: "left forearm tingling yesterday and today"  HPI: [name] is an 78 y.o. male.   Past Medical History  Diagnosis Date  . Recurrent prostate adenocarcinoma   . Unspecified psychosis   . Cardiac pacemaker [employer]   . Atrial fibrillation   . Pain in limb   . Encounter for long-term (current) use of other medications   . Unspecified essential hypertension   . Benign neoplasm of colon   . BPH (benign prostatic hypertrophy)   . Other and unspecified hyperlipidemia   . Other acquired absence of organ   . Unspecified hemorrhoids without mention of complication   . Diverticulosis of colon (without mention of hemorrhage)   . History of vein stripping    Medications: I have reviewed the patient's current medications.  Past Surgical History  Procedure Date  . Pacemaker placement 1999, replaced 2008    hx tachy-brady  . Vein stripping laser   . Tonsillectomy and adenoidectomy    Family History  Problem Relation Age of Onset  . Diabetes Neg Hx   . Hypertension Neg Hx   . Coronary artery disease Neg Hx    Social History:  reports that he has never smoked. He does not have any smokeless tobacco history on file. He reports that he drinks alcohol. He reports that he does not use illicit drugs.  Allergies: No Known Allergies  ROS: as above  Blood pressure 127/69, pulse 61, temperature 97.8 F (36.6 C), temperature source Oral, resp. rate 18, SpO2 100.00%.  Neurological exam: AAO*1. No aphasia.  Followed simple commands. Cranial nerves: EOMI, PERRL. Visual fields were full. Sensation to V1 through V3 areas of the face was intact and symmetric throughout. There was no facial asymmetry. Hearing to finger rub was equal and symmetrical bilaterally. Shoulder shrug was 5/5 and symmetric bilaterally. Head rotation was 5/5 bilaterally. There was no dysarthria or palatal deviation. Motor: strength was 5/5 and symmetric throughout. Sensory: was intact throughout to  light touch, pinprick. Coordination: finger-to-nose were intact and symmetric bilaterally. Reflexes: were 2+ in upper extremities and 1+ at the knees and 1+ at the ankles. Plantar response was downgoing bilaterally. Gait: deferred. Tinel's and Phalen's signs negative b/l at both wrists.   Results for orders placed during the hospital encounter of 07/09/11 (from the past 48 hour(s))  CBC     Status: Abnormal   Collection Time   07/09/11  9:35 AM      Component Value Range Comment   WBC 12.8 (*) 4.0 - 10.5 (K/uL)    RBC 3.88 (*) 4.22 - 5.81 (MIL/uL)    Hemoglobin 12.5 (*) 13.0 - 17.0 (g/dL)    HCT 40.9 (*) 81.1 - 52.0 (%)    MCV 96.6  78.0 - 100.0 (fL)    MCH 32.2  26.0 - 34.0 (pg)    MCHC 33.3  30.0 - 36.0 (g/dL)    RDW 91.4 (*) 78.2 - 15.5 (%)    Platelets 768 (*) 150 - 400 (K/uL)   DIFFERENTIAL     Status: Abnormal   Collection Time   07/09/11  9:35 AM      Component Value Range Comment   Neutrophils Relative 79 (*) 43 - 77 (%)    Lymphocytes Relative 7 (*) 12 - 46 (%)    Monocytes Relative 11  3 - 12 (%)    Eosinophils Relative 2  0 - 5 (%)    Basophils Relative 1  0 - 1 (%)  Neutro Abs 10.1 (*) 1.7 - 7.7 (K/uL)    Lymphs Abs 0.9  0.7 - 4.0 (K/uL)    Monocytes Absolute 1.4 (*) 0.1 - 1.0 (K/uL)    Eosinophils Absolute 0.3  0.0 - 0.7 (K/uL)    Basophils Absolute 0.1  0.0 - 0.1 (K/uL)    RBC Morphology RARE NRBCs      Smear Review PLATELET COUNT CONFIRMED BY SMEAR   LARGE PLATELETS PRESENT  PROTIME-INR     Status: Abnormal   Collection Time   07/09/11  9:35 AM      Component Value Range Comment   Prothrombin Time 23.6 (*) 11.6 - 15.2 (seconds)    INR 2.06 (*) 0.00 - 1.49    APTT     Status: Abnormal   Collection Time   07/09/11  9:35 AM      Component Value Range Comment   aPTT 41 (*) 24 - 37 (seconds)   BASIC METABOLIC PANEL     Status: Abnormal   Collection Time   07/09/11  9:35 AM      Component Value Range Comment   Sodium 141  135 - 145 (mEq/L)    Potassium 3.6  3.5 -  5.1 (mEq/L)    Chloride 103  96 - 112 (mEq/L)    CO2 27  19 - 32 (mEq/L)    Glucose, Bld 148 (*) 70 - 99 (mg/dL)    BUN 22  6 - 23 (mg/dL)    Creatinine, Ser 1.91  0.50 - 1.35 (mg/dL)    Calcium 9.3  8.4 - 10.5 (mg/dL)    GFR calc non Af Amer 67 (*) >90 (mL/min)    GFR calc Af Amer 78 (*) >90 (mL/min)    Ct Head Wo Contrast  07/09/2011  *RADIOLOGY REPORT*  Clinical Data: Left arm numbness  CT HEAD WITHOUT CONTRAST  Technique:  Contiguous axial images were obtained from the base of the skull through the vertex without contrast.  Comparison: 03/03/2010  Findings: No evidence of parenchymal hemorrhage or extra-axial fluid collection. No mass lesion, mass effect, or midline shift.  No CT evidence of acute infarction.  Subcortical white matter and periventricular small vessel ischemic changes.  Age related atrophy.  No ventriculomegaly.  The visualized paranasal sinuses are essentially clear. The mastoid air cells are unopacified.  No evidence of calvarial fracture.  IMPRESSION: No evidence of acute intracranial abnormality.  Age related atrophy with small vessel ischemic changes.  Original Report Authenticated By: Charline Bills, M.D.   Assessment/Plan: 78 years old man with a-fib on coumadin (INR 2.06) who comes in with left arm and forearm tingling that occurred yesterday and today for about 15 to 20 min. Left arm numbness in May of 2012 that was called a TIA 1) Admit for observation 2) MRI brain cannot be done due to pacer 3) Echo/CD 4) Lipid profile and HgA1C 5) Will follow  Trezure Cronk 07/09/2011, 11:06 AM

## 2011-07-09 NOTE — Progress Notes (Signed)
ANTICOAGULATION CONSULT NOTE - Initial Consult  Pharmacy Consult for Coumadin Indication: atrial fibrillation  No Known Allergies  Patient Measurements: Height: 6\' 5"  (195.6 cm) Weight: 186 lb 11.2 oz (84.687 kg) IBW/kg (Calculated) : 89.1  Adjusted Body Weight:   Vital Signs: Temp: 97.5 F (36.4 C) (01/20 1415) Temp src: Oral (01/20 1415) BP: 147/77 mmHg (01/20 1415) Pulse Rate: 60  (01/20 1415)  Labs:  Basename 07/09/11 0935  HGB 12.5*  HCT 37.5*  PLT 768*  APTT 41*  LABPROT 23.6*  INR 2.06*  HEPARINUNFRC --  CREATININE 1.02  CKTOTAL --  CKMB --  TROPONINI --   Estimated Creatinine Clearance: 69.2 ml/min (by C-G formula based on Cr of 1.02).  Medical History: Past Medical History  Diagnosis Date  . Recurrent prostate adenocarcinoma   . Unspecified psychosis   . Cardiac pacemaker [employer]   . Atrial fibrillation   . Pain in limb   . Encounter for long-term (current) use of other medications   . Unspecified essential hypertension   . Benign neoplasm of colon   . BPH (benign prostatic hypertrophy)   . Other and unspecified hyperlipidemia   . Other acquired absence of organ   . Unspecified hemorrhoids without mention of complication   . Diverticulosis of colon (without mention of hemorrhage)   . History of vein stripping   . Thrombocytosis 07/09/2011  . Normocytic anemia 07/09/2011  . TIA (transient ischemic attack) 07/09/2011    Medications:  Scheduled:    . sodium chloride   Intravenous Once  . aspirin  300 mg Rectal Daily   Or  . aspirin  325 mg Oral Daily  . atenolol  50 mg Oral Daily  . donepezil  10 mg Oral QHS  . memantine  10 mg Oral BID  . pneumococcal 23 valent vaccine  0.5 mL Intramuscular Tomorrow-1000  . sodium chloride  3 mL Intravenous Q12H   Infusions:    Assessment:  82 YOM on Coumadin PTA for h/o afib and TIA.  Home regimen:  5 mg po daily as directed by [facility] on 1/15  Pt admitted for r/o TIA   INR  2.06 at 0935 on 1/20.  Last dose of Coumadin reportedly taken 1/19.  Goal of Therapy:  INR 2-3   Plan:   Coumadin 5 mg po daily as per home med  Daily PT/INR  Pharmacy will f/u daily and adjust Coumadin as needed.  Geoffry Paradise Thi 07/09/2011,4:58 PM

## 2011-07-09 NOTE — H&P (Signed)
Hospital Admission Note Date: 07/09/2011  Patient name: [name] Medical record number: [id number] Date of birth: [birth year] Age: 78 y.o. Gender: male PCP: Illene Regulus, MD, MD  Attending physician: Duke Salvia, * Emergency Contact: Taivon Haroon (wife) [phone] (home) [phone] (cell) Code Status:  Full  Chief Complaint: Left hand and forearm numbness  History of Present Illness: [name] is an 78 y.o. male  with a PMH of chronic a-fib on coumadin (INR 2.06) who came to the ER with a chief complaint of  left arm and forearm tingling that occurred yesterday and today for about 15 to 20 min. He had a similar episode in May of 2012 which was attributed to a TIA.  No current c/o numbness.  No speech changes, dysphagia or dysphonia.  No headache.  No gait or balance disturbance.  The patient states his cardiologist took him off his aspirin about a week ago.  No aggravating or alleviating factors.  He has been seen by the neurologist who recommended he be admitted to the hospitalist service for further evaluation and work up.   Past Medical History Past Medical History  Diagnosis Date  . Recurrent prostate adenocarcinoma   . Unspecified psychosis   . Cardiac pacemaker [employer]   . Atrial fibrillation   . Pain in limb   . Encounter for long-term (current) use of other medications   . Unspecified essential hypertension   . Benign neoplasm of colon   . BPH (benign prostatic hypertrophy)   . Other and unspecified hyperlipidemia   . Other acquired absence of organ   . Unspecified hemorrhoids without mention of complication   . Diverticulosis of colon (without mention of hemorrhage)   . History of vein stripping   . Thrombocytosis 07/09/2011  . Normocytic anemia 07/09/2011  . TIA (transient ischemic attack) 07/09/2011    Past Surgical History Past Surgical History  Procedure Date  . Pacemaker placement 1999, replaced 2008    hx tachy-brady  . Vein stripping  laser   . Tonsillectomy and adenoidectomy     Meds: Prior to Admission medications   Medication Sig Start Date End Date Taking? Authorizing Provider  atenolol (TENORMIN) 50 MG tablet TAKE ONE TABLET BY MOUTH EVERY DAY 03/06/11  Yes Dolores Patty, MD  donepezil (ARICEPT) 10 MG tablet TAKE ONE TABLET BY MOUTH AT BEDTIME. 03/06/11  Yes Duke Salvia, MD  memantine (NAMENDA) 10 MG tablet Take 1 tablet (10 mg total) by mouth 2 (two) times daily. 05/29/11 05/28/12 Yes Duke Salvia, MD  warfarin (COUMADIN) 5 MG tablet TAKE AS DIRECTED BY ANTICOAGULATION CLINIC 05/24/11  Yes Rollene Rotunda, MD    Allergies: Review of patient's allergies indicates no known allergies.  Social History: History   Social History  . Marital Status: Married    Spouse Name: N/A    Number of Children: N/A  . Years of Education: 16   Occupational History  . Retired   . President of an [employer]    Social History Main Topics  . Smoking status: Former Smoker -- 0.5 packs/day for 8 years  . Smokeless tobacco: Not on file  . Alcohol Use: 1.5 oz/week    3 drink(s) per week     beer only 3 cans a week  . Drug Use: No  . Sexually Active: Not on file   Other Topics Concern  . Not on file   Social History Narrative   Married.  Lives in [place] with his wife.  Ambulates independently.  Walks 4 miles per day.    Family History:  Family History  Problem Relation Age of Onset  . Diabetes Neg Hx   . Hypertension Neg Hx   . Coronary artery disease Mother   . Coronary artery disease Sister     Review of Systems: Constitutional: No fever or chills;  Appetite normal; No weight loss.  HEENT: No blurry vision or diplopia, no pharyngitis or dysphagia CV: No chest pain or arrhythmia.  Resp: No SOB, no cough. GI: No N/V, no diarrhea, no melena or hematochezia.  + fecal incontinence GU: No dysuria or hematuria.  MSK: no myalgias/arthralgias.  Neuro:  No headache or focal neurological  deficits.  Psych: No depression or anxiety.  Endo: No thyroid disease or DM.  Skin: No rashes or lesions.  Heme: No anemia or blood dyscrasia   Physical Exam: Blood pressure 127/69, pulse 61, temperature 97.8 F (36.6 C), temperature source Oral, resp. rate 18, SpO2 100.00%. BP 127/69  Pulse 61  Temp(Src) 97.8 F (36.6 C) (Oral)  Resp 18  SpO2 100%  General Appearance:    Alert, cooperative, no distress, appears stated age  Head:    Normocephalic, without obvious abnormality, atraumatic  Eyes:    PERRL, conjunctiva/corneas clear, EOM's intact     Ears:    Normal external ear canals, both ears  Nose:   Nares normal, septum midline, mucosa normal, no drainage    or sinus tenderness  Throat:   Lips, mucosa, and tongue normal; Fair dentition  Neck:   Supple, symmetrical, trachea midline, no adenopathy;       thyroid:  No enlargement/tenderness/nodules; no carotid   bruit or JVD  Back:     Symmetric, no curvature, ROM normal, no CVA tenderness  Lungs:     Clear to auscultation bilaterally, respirations unlabored  Chest wall:    No tenderness or deformity  Heart:    Regular rate and rhythm, S1 and S2 normal, no murmur, rub   or gallop  Abdomen:     Soft, non-tender, bowel sounds active all four quadrants,    no masses, no organomegaly  Extremities:   Extremities normal, atraumatic, no cyanosis or edema  Pulses:   2+ and symmetric all extremities  Skin:   Skin color, texture, turgor normal, no rashes or lesions  Lymph nodes:   Cervical, supraclavicular, and axillary nodes normal  Neurologic:   CNII-XII intact. Normal strength, sensation and reflexes      Throughout.  ST memory deficits noted.  Oriented to self.   Lab results: Basic Metabolic Panel:  Lab 07/09/11 1610  NA 141  K 3.6  CL 103  CO2 27  GLUCOSE 148*  BUN 22  CREATININE 1.02  CALCIUM 9.3  MG --  PHOS --   GFR The CrCl is unknown because both a height and weight (above a minimum accepted value) are required for  this calculation. Coagulation profile  Lab 07/09/11 0935 07/04/11 1502  INR 2.06* 1.8  PROTIME -- --    CBC:  Lab 07/09/11 0935  WBC 12.8*  NEUTROABS 10.1*  HGB 12.5*  HCT 37.5*  MCV 96.6  PLT 768*    Imaging results:  Ct Head Wo Contrast  07/09/2011  *RADIOLOGY REPORT*  Clinical Data: Left arm numbness  CT HEAD WITHOUT CONTRAST  Technique:  Contiguous axial images were obtained from the base of the skull through the vertex without contrast.  Comparison: 03/03/2010  Findings: No evidence of parenchymal hemorrhage or extra-axial  fluid collection. No mass lesion, mass effect, or midline shift.  No CT evidence of acute infarction.  Subcortical white matter and periventricular small vessel ischemic changes.  Age related atrophy.  No ventriculomegaly.  The visualized paranasal sinuses are essentially clear. The mastoid air cells are unopacified.  No evidence of calvarial fracture.  IMPRESSION: No evidence of acute intracranial abnormality.  Age related atrophy with small vessel ischemic changes.  Original Report Authenticated By: Charline Bills, M.D.    Assessment & Plan: Principal Problem:  *TIA (transient ischemic attack) The patient has been seen and evaluated by Dr. Lyman Speller of neurology who recommends an inpatient evaluation.  MRI/MRA cannot be done secondary to the patient's history of having a pacemaker.  We will continue the TIA work up with carotid dopplers, 2 D Echocardiogram, FLP and hemoglobin A1c.  Will resume ASA therapy.  Check a bedside swallowing evaluation. Active Problems:  HYPERLIPIDEMIA Check FLP and place on heart healthy diet once dysphagia ruled out with a bedside swallowing evaluation.  Dementia Appears to have significant ST memory deficits.  Continue Aricept and Namenda.  HYPERTENSION Continue usual anti-hypertensives and monitor for BP control.  Atrial fibrillation INR is therapeutic and he is rate controlled.  2 D Echo ordered to rule out a source of  embolism.  Continue coumadin per pharmacy dosing.  PACEMAKER, PERMANENT No MRI/MRA.  Hyperglycemia Not a fasting sample.  Check hemoglobin A1c.  Thrombocytosis Primary versus reactive.  If he were to have essential thrombocytosis or a myelodysplastic syndrome, it could increase his risk of thrombotic events, so would maintain him on anti-platelet therapy.  He does have a mild leukocytosis so will rule out infection/reactive.  His platelet count was 416 on 11/19/10 and was normal back on 05/06/07.  Check ESR.  Leukocytosis No symptoms suggestive of infection but will check a U/A and chest radiograph to rule out an occult UTI or lung process.  Normocytic anemia Mild. ? Myelodysplastic syndrome given other hematological abnormalities.    Time Spent On Admission: 1 hour.  RAMA,CHRISTINA 07/09/2011, 12:04 PM Pager [phone]

## 2011-07-09 NOTE — Progress Notes (Signed)
*  PRELIMINARY RESULTS*   Carotid Dopplers completed.  There is no significant ICA stenosis.  Vertebral artery flow is antegrade.    Sherren Kerns Renee 07/09/2011, 5:08 PM

## 2011-07-10 DIAGNOSIS — G459 Transient cerebral ischemic attack, unspecified: Secondary | ICD-10-CM

## 2011-07-10 DIAGNOSIS — I4891 Unspecified atrial fibrillation: Secondary | ICD-10-CM

## 2011-07-10 DIAGNOSIS — I10 Essential (primary) hypertension: Secondary | ICD-10-CM

## 2011-07-10 LAB — HEMOGLOBIN A1C
Hgb A1c MFr Bld: 5.9 % — ABNORMAL HIGH (ref <5.7)
Mean Plasma Glucose: 123 mg/dL — ABNORMAL HIGH (ref <117)

## 2011-07-10 LAB — PROTIME-INR: Prothrombin Time: 26.9 seconds — ABNORMAL HIGH (ref 11.6–15.2)

## 2011-07-10 LAB — LIPID PANEL
HDL: 53 mg/dL (ref >39)
Triglycerides: 59 mg/dL (ref <150)

## 2011-07-10 MED ORDER — STROKE: EARLY STAGES OF RECOVERY BOOK
Freq: Once | Status: DC
  Filled 2011-07-10: qty 1

## 2011-07-10 NOTE — Discharge Summary (Signed)
NAMEATILANO, COVELLI                ACCOUNT NO.:  [id number]  MEDICAL RECORD NO.:  [id number]  LOCATION:  1424                         FACILITY:  [facility]  PHYSICIAN:  Rosalyn Gess. Antwain Caliendo, MD  DATE OF BIRTH:  23-Apr-1929  DATE OF ADMISSION:  07/09/2011 DATE OF DISCHARGE:  07/10/2011                              DISCHARGE SUMMARY   ADMITTING DIAGNOSES: 1. Transient ischemic attack. 2. Atrial fibrillation. 3. Hypertension. 4. Mild dementia with memory deficits. 5. Anemia.  DISCHARGE DIAGNOSES: 1. Transient ischemic attack. 2. Atrial fibrillation. 3. Hypertension. 4. Mild dementia with memory deficits. 5. Anemia.  PROCEDURES: 1. CT scan of the brain, July 09, 2011, which was unremarkable for     any acute changes.  No evidence of intracranial abnormality.  Age-     related atrophy with small vessel ischemic changes was noted. 2. Chest x-ray, July 09, 2011, which showed no active     cardiopulmonary disease.   3. Preliminary report on carotid Doppler     performed, July 09, 2011, at 5:08 pm, was no significant ICA     stenosis.  This is unchanged from the patient having a previous     carotid Doppler in 01/04/2012.  HISTORY OF PRESENT ILLNESS:  Mr. Maines is a pleasant 78 year old gentleman with a history of chronic atrial fibrillation, who had an episode in June'12 of mild tingling and weakness in his hand, who was seen in [facility] and had a full evaluation there.  Short of MRI, which cannot be done because of pacemaker.  He was discharged to home. In follow up, he did have carotid Doppler in [place] that was without significant ICA stenosis, he had 2D echo with an EF of 55%.  No significant abnormality.  The patient is followed on a regular basis by the EP service and just saw Dr. Graciela Husbands on January 15, at which time, he was stable and doing well.  The patient had an episode on the day of admission, lasting 15-20 minutes with a left arm and forearm tingling.  He  had no persistent loss of sensation.  He had no change in his speech.  No dysphagia.  No dysphonia.  No headache.  No gait changes.  By the time he arrived to the emergency department, his symptoms were resolved.  The patient was seen in consultation by Dr. Gwendel Hanson, hospitalist neurologist, in the emergency department, who recommended admission and standard TIA evaluation except the patient cannot have MRI/MRA because of his pacemaker.  I am not sure if the neurologist was aware of the fact with the patient's previous studies in June.  Please see the H and P for past medical history, family history, social history, and medications.  HOSPITAL COURSE:  The patient was admitted to a telemetry floor.  He remained in atrial fibrillation.  He had carotid Dopplers as noted which were unremarkable.  INR was therapeutic at 2.06 on admission and 2.44 at the time of this dictation.  Other lab work was unremarkable with a normal chemistry panel x2.  White count was mildly elevated at 12,800 with a hemoglobin of 12.5 g and platelet count is 768,000.  Differential was unremarkable.  The  patient had rare nucleated RBCs and elevated platelet count.  Sed rate was 3.  The patient has done well in the hospital with no recurrent episodes of numbness or tingling and feels that he is at his baseline.  PHYSICAL EXAMINATION:  VITAL SIGNS:  Temperature was 97.7, blood pressure 130/68, heart rate was 60 respirations 20, and O2 sats 96% on room air. GENERAL APPEARANCE:  This is a well-nourished and well-developed gentleman, in no acute distress, lying in bed.  He is easily awakened and fully alert upon awakening. HEENT EXAM:  Normocephalic, atraumatic.  Conjunctiva and sclerae were clear. CHEST:  No deformities. PULMONARY:  The patient has normal respirations. CARDIOVASCULAR:  2+ radial pulses.  Precordium is quiet.  He has an irregularly irregular heart rhythm.  He had no JVD.  No carotid bruits on  exam. ABDOMEN:  Soft. NEURO EXAM:  The patient is awake, alert.  He is oriented to person, place, and time, recognizes his examiner.  The patient moves all of his extremities without limitations.  He reports he has normal sensation, although this was not formally tested.  The patient has no tremor.  With the patient having a normal examination with his carotid Dopplers being normal with a 2D echo in June having been artery done, with the patient having a CT scan without any abnormality with him not being a candidate for MRI/ MRA.  At this point, he is stable and ready for discharge to home.  He is already therapeutic on Coumadin, and will continue the same.  Question as to whether aspirin has much to offer for a fully anticoagulated patient.  The patient will continue on all of his home medications without change.  The patient will be discharged to home.  He will be seen in the office for a followup in 7 days.  The patient's condition at the time of discharge dictation is in fact stable and improved.     Rosalyn Gess Mattilynn Forrer, MD     MEN/MEDQ  D:  07/10/2011  T:  07/10/2011  Job:  [id number]

## 2011-07-10 NOTE — Progress Notes (Signed)
Occupational Therapy Evaluation Patient Details Name: [name] MRN: [id number] DOB: 08/06/1928 Today's Date: 07/10/2011 [id number] Problem List:  Patient Active Problem List  Diagnoses  . POLYP, COLON  . HYPERLIPIDEMIA  . Dementia  . HYPERTENSION  . Atrial fibrillation  . BRADYCARDIA  . HEMORRHOIDS  . DIVERTICULOSIS, COLON  . LEG PAIN, LEFT  . OTHER ABNORMAL BLOOD CHEMISTRY  . BENIGN PROSTATIC HYPERTROPHY, HX OF  . PACEMAKER, PERMANENT  . ADENOCARCINOMA, PROSTATE, GLEASON GRADE 4  . TIA (transient ischemic attack)  . Hyperglycemia  . Thrombocytosis  . Leukocytosis  . Normocytic anemia    Past Medical History:  Past Medical History  Diagnosis Date  . Recurrent prostate adenocarcinoma   . Unspecified psychosis   . Cardiac pacemaker [employer]   . Atrial fibrillation   . Pain in limb   . Encounter for long-term (current) use of other medications   . Unspecified essential hypertension   . Benign neoplasm of colon   . BPH (benign prostatic hypertrophy)   . Other and unspecified hyperlipidemia   . Other acquired absence of organ   . Unspecified hemorrhoids without mention of complication   . Diverticulosis of colon (without mention of hemorrhage)   . History of vein stripping   . Thrombocytosis 07/09/2011  . Normocytic anemia 07/09/2011  . TIA (transient ischemic attack) 07/09/2011   Past Surgical History:  Past Surgical History  Procedure Date  . Pacemaker placement 1999, replaced 2008    hx tachy-brady  . Vein stripping laser   . Tonsillectomy and adenoidectomy     OT Assessment/Plan/Recommendation OT Assessment Clinical Impression Statement: Pt is independent with all ADLs and functional mobility. No f/u OT or equip needed. OT Recommendation/Assessment: Patient does not need any further OT services OT Recommendation Follow Up Recommendations: No OT follow up Equipment Recommended: None recommended by OT OT Goals    OT  Evaluation Precautions/Restrictions    Prior Functioning Home Living Lives With: Spouse Type of Home: House Home Layout: Two level;Able to live on main level with bedroom/bathroom;Full bath on main level Home Access: Stairs to enter Entrance Stairs-Rails: Right Entrance Stairs-Number of Steps: 2 Bathroom Shower/Tub: Walk-in shower;Door [employer] Toilet: Standard Home Adaptive Equipment: None Prior Function Level of Independence: Independent with basic ADLs;Independent with homemaking with ambulation;Independent with transfers;Needs assistance with gait Driving: Yes Vocation: Retired ADL ADL Grooming: Simulated;Independent Where Assessed - Grooming: Standing at sink Upper Body Bathing: Simulated;Independent Where Assessed - Upper Body Bathing: Standing at sink Lower Body Bathing: Simulated;Independent Where Assessed - Lower Body Bathing: Sit to stand from bed Upper Body Dressing: Simulated;Independent Where Assessed - Upper Body Dressing: Standing Lower Body Dressing: Simulated;Independent Where Assessed - Lower Body Dressing: Sit to stand from bed Toilet Transfer: Performed;Independent Toilet Transfer Method: Proofreader: Regular height toilet Toileting - Clothing Manipulation: Investment banker, operational - Hygiene: Independent Where Assessed - Toileting Hygiene: Sit to stand from 3-in-1 or toilet Tub/Shower Transfer: Simulated;Independent Tub/Shower Transfer Method: Ambulating Ambulation Related to ADLs: Pt with good dynamic standing balance. Ambulated to bathroom with steady gait. Vision/Perception  Vision - History Baseline Vision: Wears glasses only for reading Patient Visual Report: No change from baseline Vision - Assessment Vision Assessment: Vision not tested Cognition Cognition Arousal/Alertness: Awake/alert Overall Cognitive Status: Appears within functional limits for tasks assessed Orientation Level: Oriented X4 Sensation/Coordination    Extremity Assessment RUE Assessment RUE Assessment: Within Functional Limits LUE Assessment LUE Assessment: Within Functional Limits Mobility  Bed Mobility Bed Mobility: Yes Supine to Sit: 7:  Independent;HOB flat Transfers Transfers: Yes Sit to Stand: 7: Independent;Without upper extremity assist;From bed;From toilet Stand to Sit: 7: Independent;Without upper extremity assist;To toilet;To bed Exercises   End of Session OT - End of Session Activity Tolerance: Patient tolerated treatment well Patient left: in bed;with call bell in reach;with family/visitor present General Behavior During Session: [facility] for tasks performed Cognition: [facility] for tasks performed   Annesha Delgreco A, OTR/L [phone] 07/10/2011, 9:07 AM

## 2011-07-10 NOTE — Progress Notes (Signed)
ST Note [phone]  _X__ Eval cancelled today due to pt is discharged, RN reports short term memory deficits that are chronic, but no new changes.  MD please order SLP as outpt if deem necessary.  Thanks.    [name], MS [facility] SLP   [phone]

## 2011-07-10 NOTE — Consult Note (Signed)
Subjective: Patient is doing well.  Objective: Vital signs in last 24 hours: Temp:  [97.5 F (36.4 C)-98.2 F (36.8 C)] 97.7 F (36.5 C) (01/21 0439) Pulse Rate:  [60-73] 60  (01/21 0439) Resp:  [18-20] 20  (01/21 0439) BP: (109-147)/(62-77) 130/68 mmHg (01/21 0439) SpO2:  [96 %-100 %] 96 % (01/21 0439) Weight:  [84.687 kg (186 lb 11.2 oz)] 84.687 kg (186 lb 11.2 oz) (01/20 1415)  Intake/Output from previous day: 01/20 0701 - 01/21 0700 In: 270 [P.O.:270] Out: 200 [Urine:200] Intake/Output this shift:   Nutritional status: Cardiac  Neurological exam: AAO*1. No aphasia. Followed simple commands. Cranial nerves: EOMI, PERRL. Visual fields were full. Sensation to V1 through V3 areas of the face was intact and symmetric throughout. There was no facial asymmetry. Hearing to finger rub was equal and symmetrical bilaterally. Shoulder shrug was 5/5 and symmetric bilaterally. Head rotation was 5/5 bilaterally. There was no dysarthria or palatal deviation. Motor: strength was 5/5 and symmetric throughout. Sensory: was intact throughout to light touch, pinprick. Coordination: finger-to-nose were intact and symmetric bilaterally. Reflexes: were 2+ in upper extremities and 1+ at the knees and 1+ at the ankles. Plantar response was downgoing bilaterally. Gait: deferred. Tinel's and Phalen's signs negative b/l at both wrists.   Lab Results:  Basename 07/09/11 1717 07/09/11 0935  WBC -- 12.8*  HGB -- 12.5*  HCT -- 37.5*  PLT -- 768*  NA 141 141  K 3.8 3.6  CL 106 103  CO2 26 27  GLUCOSE 110* 148*  BUN 20 22  CREATININE 0.91 1.02  CALCIUM 8.9 9.3  LABA1C -- --   Studies/Results: Dg Chest 2 View  07/09/2011  *RADIOLOGY REPORT*  Clinical Data: Stroke symptoms.  History of prostate cancer.  CHEST - 2 VIEW  Comparison: None.  Findings: Left subclavian pacemaker lead is present at the right ventricular apex.  Heart size and mediastinal contours are normal. The lungs are clear. There is no  pleural effusion.  There is mild asymmetry of the first costochondral junctions.  No focal or acute osseous findings are identified.  IMPRESSION: No active cardiopulmonary process identified.  Original Report Authenticated By: Gerrianne Scale, M.D.   Ct Head Wo Contrast  07/09/2011  *RADIOLOGY REPORT*  Clinical Data: Left arm numbness  CT HEAD WITHOUT CONTRAST  Technique:  Contiguous axial images were obtained from the base of the skull through the vertex without contrast.  Comparison: 03/03/2010  Findings: No evidence of parenchymal hemorrhage or extra-axial fluid collection. No mass lesion, mass effect, or midline shift.  No CT evidence of acute infarction.  Subcortical white matter and periventricular small vessel ischemic changes.  Age related atrophy.  No ventriculomegaly.  The visualized paranasal sinuses are essentially clear. The mastoid air cells are unopacified.  No evidence of calvarial fracture.  IMPRESSION: No evidence of acute intracranial abnormality.  Age related atrophy with small vessel ischemic changes.  Original Report Authenticated By: Charline Bills, M.D.   Medications: I have reviewed the patient's current medications.  Assessment/Plan: 78 years old man with TIA - left arm and hand tingling 1) Stopped ASA. He does not need to be on both Coumadin and ASA due to increased risk of bleeding with little extra benefit 2) Please follow up CD and Echo results and Lipid Profile and HgA1C results. 3) We will make brief additional recommendations when the work up is complete or primary team can call us when the above are done for further input    LOS: 1 day  [name]

## 2011-07-10 NOTE — Progress Notes (Signed)
Patient has had no symptoms since before coming to the ED yesterday. He is normal on exam. He had carotid doppler w/o significant blockage, CT head normal. He had 2 D echo in June.  Will d/c home and follow up in 7 days.  dicttion # [id number]

## 2011-07-17 ENCOUNTER — Encounter: Payer: Self-pay | Admitting: Internal Medicine

## 2011-07-17 ENCOUNTER — Ambulatory Visit (INDEPENDENT_AMBULATORY_CARE_PROVIDER_SITE_OTHER): Payer: Medicare Other | Admitting: Internal Medicine

## 2011-07-17 VITALS — BP 102/68 | HR 68 | Temp 97.0°F | Resp 14 | Wt 193.4 lb

## 2011-07-17 DIAGNOSIS — R209 Unspecified disturbances of skin sensation: Secondary | ICD-10-CM

## 2011-07-17 DIAGNOSIS — R202 Paresthesia of skin: Secondary | ICD-10-CM

## 2011-07-17 NOTE — Patient Instructions (Signed)
Sound like you are doing ok. At discharge from hospital you INR was 2.44. Plan - appointment at the coag clinic, [facility], tomorrow, Jan 29th at 1:45 PM  Continue all your regular medications.  For continued problems of tingling or weakness in the left arm we will start an evaluation of the neck to look for any nerve compression issues as a cause.

## 2011-07-18 ENCOUNTER — Ambulatory Visit (INDEPENDENT_AMBULATORY_CARE_PROVIDER_SITE_OTHER): Payer: Medicare Other | Admitting: *Deleted

## 2011-07-18 DIAGNOSIS — I4891 Unspecified atrial fibrillation: Secondary | ICD-10-CM

## 2011-07-18 DIAGNOSIS — R200 Anesthesia of skin: Secondary | ICD-10-CM | POA: Insufficient documentation

## 2011-07-18 LAB — POCT INR: INR: 2.9

## 2011-07-18 NOTE — Assessment & Plan Note (Signed)
Patient with two episodes of paresthesia left UE leading to neuro eval that was negative x 2.   For continued symptoms will evaluate cervical spine.  He is scheduled for coag clinic Jan 29th to follow up on INR. At [facility] d/c Jan 21st INR = 2.44

## 2011-07-18 NOTE — Progress Notes (Signed)
Subjective:    Patient ID: [name], male    DOB: 02-05-1929, 78 y.o.   MRN: [id number]  HPI [name] recently had an episode of left UE tingling and weakness that lasted about 15 minutes. He went to [facility] ED and was subsequently admitted for possible TIA. Of note he had a similar episode last June and was evaluated at [facility]. That evaluation and subsequent out patient evaluation through [facility] was negative. He is on coumadin and his INR Jan 15th was 1.8. Due to pacemaker he cannot have MRI studies. [name] overnight hospital evaluation was negative and he was d/c'd Jan 21st. In the interval he reports that he has had fleeting tingling in his left UE but no other symptoms.  His hospitalization and testing was reviewed. His other medical problems remain stable.  Past Medical History  Diagnosis Date  . Recurrent prostate adenocarcinoma   . Unspecified psychosis   . Cardiac pacemaker [employer]   . Atrial fibrillation   . Pain in limb   . Encounter for long-term (current) use of other medications   . Unspecified essential hypertension   . Benign neoplasm of colon   . BPH (benign prostatic hypertrophy)   . Other and unspecified hyperlipidemia   . Other acquired absence of organ   . Unspecified hemorrhoids without mention of complication   . Diverticulosis of colon (without mention of hemorrhage)   . History of vein stripping   . Thrombocytosis 07/09/2011  . Normocytic anemia 07/09/2011  . TIA (transient ischemic attack) 07/09/2011   Past Surgical History  Procedure Date  . Pacemaker placement 1999, replaced 2008    hx tachy-brady  . Vein stripping laser   . Tonsillectomy and adenoidectomy    Family History  Problem Relation Age of Onset  . Diabetes Neg Hx   . Hypertension Neg Hx   . Coronary artery disease Mother   . Coronary artery disease Sister    History   Social History  . Marital Status: Married    Spouse Name: N/A    Number of Children: N/A  .  Years of Education: 16   Occupational History  . Retired   . President of an [employer]    Social History Main Topics  . Smoking status: Former Smoker -- 0.5 packs/day for 8 years    Types: Cigarettes  . Smokeless tobacco: Not on file  . Alcohol Use: 1.5 oz/week    3 drink(s) per week     beer only 3 cans a week  . Drug Use: No  . Sexually Active: Not on file   Other Topics Concern  . Not on file   Social History Narrative   Married.  Lives in [place] with his wife.  Ambulates independently.  Walks 4 miles per day.       Review of Systems System review is negative for any constitutional, cardiac, pulmonary, GI or neuro symptoms or complaints other than as described in the HPI.      Objective:   Physical Exam Filed Vitals:   07/17/11 1030  BP: 102/68  Pulse: 68  Temp: 97 F (36.1 C)  Resp: 14   Gen'l - tall, lanky white man in no distress HEENT = C&S clear, EOMI Cor - 2+ pulse, IRIR, II/VI systolic murmur PUlm - normal respirations and clear lungs Neuro - A&O x 3, memory not tested, CN II-XII normal, MS normal, gait normal       Assessment & Plan:

## 2011-08-01 ENCOUNTER — Encounter: Payer: Medicare Other | Admitting: *Deleted

## 2011-08-08 ENCOUNTER — Ambulatory Visit (INDEPENDENT_AMBULATORY_CARE_PROVIDER_SITE_OTHER): Payer: Medicare Other | Admitting: *Deleted

## 2011-08-08 DIAGNOSIS — I4891 Unspecified atrial fibrillation: Secondary | ICD-10-CM

## 2011-08-08 LAB — POCT INR: INR: 2.9

## 2011-08-24 ENCOUNTER — Other Ambulatory Visit: Payer: Self-pay | Admitting: Internal Medicine

## 2011-09-04 ENCOUNTER — Ambulatory Visit (INDEPENDENT_AMBULATORY_CARE_PROVIDER_SITE_OTHER): Payer: Medicare Other | Admitting: Pharmacist

## 2011-09-04 DIAGNOSIS — I4891 Unspecified atrial fibrillation: Secondary | ICD-10-CM

## 2011-10-02 ENCOUNTER — Ambulatory Visit (INDEPENDENT_AMBULATORY_CARE_PROVIDER_SITE_OTHER): Payer: Medicare Other | Admitting: *Deleted

## 2011-10-02 DIAGNOSIS — I4891 Unspecified atrial fibrillation: Secondary | ICD-10-CM

## 2011-10-06 ENCOUNTER — Encounter: Payer: Self-pay | Admitting: Internal Medicine

## 2011-10-06 DIAGNOSIS — I495 Sick sinus syndrome: Secondary | ICD-10-CM

## 2011-10-09 ENCOUNTER — Encounter: Payer: Self-pay | Admitting: Internal Medicine

## 2011-10-09 ENCOUNTER — Ambulatory Visit (INDEPENDENT_AMBULATORY_CARE_PROVIDER_SITE_OTHER): Payer: Medicare Other | Admitting: Internal Medicine

## 2011-10-09 VITALS — BP 120/70 | HR 60 | Temp 97.8°F | Resp 16 | Wt 189.0 lb

## 2011-10-09 DIAGNOSIS — L989 Disorder of the skin and subcutaneous tissue, unspecified: Secondary | ICD-10-CM

## 2011-10-09 NOTE — Patient Instructions (Signed)
Lesion on frontal scalp looked like a superficial infection - does not look like a skin cancer.  Plan - wash with soap and water and wash cloth AM and PM           Dry           Apply a dab of neosporin and cover with a bandaid           If this doesn't resolve please return for reevaluation and possible shave biopsy.

## 2011-10-09 NOTE — Progress Notes (Signed)
  Subjective:    Patient ID: [name], male    DOB: 18-Mar-1929, 78 y.o.   MRN: [id number]  HPI Mr. Alsop presents for evaluation of a lesion on the left frontal scalp. He has had a raised red lesion for approximately 2 weeks. He has been applying neosporin ointment to this lesion until the last several days when he changed to an antifungal ointment. He has had no pain, no fever, no drainage. He does have a h/o basal cell carcinomas face and scalp.  PMH, FamHx and SocHx reviewed for any changes and relevance.    Review of Systems System review is negative for any constitutional, cardiac, pulmonary, GI or neuro symptoms or complaints other than as described in the HPI.     Objective:   Physical Exam Filed Vitals:   10/09/11 1550  BP: 120/70  Pulse: 60  Temp: 97.8 F (36.6 C)  Resp: 16   Gen'l- tall, lanky white man in no distress Derm - erythematous macular papular lesion 2 cm in diameter left frontal scalp. There is the appearance of purulent material just under the skin. There is mild surrounding erythema.       Assessment & Plan:  Skin lesion scalp - the lesion looks more infectious than malignant with no appearance to suggest squamous cell but this may be an atypical basal cell variant. The lesions opened when prepping with alcohol and 4x4 for minor incision/drainage.  Plan - wash bid with soap and water           Dry and apply neosporin and bandaid           If not resolved in 5-6 days he is to return for shave biopsy.

## 2011-11-08 ENCOUNTER — Other Ambulatory Visit: Payer: Self-pay | Admitting: Cardiology

## 2011-11-14 ENCOUNTER — Ambulatory Visit (INDEPENDENT_AMBULATORY_CARE_PROVIDER_SITE_OTHER): Payer: Medicare Other | Admitting: Pharmacist

## 2011-11-14 DIAGNOSIS — I4891 Unspecified atrial fibrillation: Secondary | ICD-10-CM

## 2011-11-15 DIAGNOSIS — Z0279 Encounter for issue of other medical certificate: Secondary | ICD-10-CM

## 2011-11-17 ENCOUNTER — Encounter: Payer: Self-pay | Admitting: Internal Medicine

## 2011-11-17 ENCOUNTER — Ambulatory Visit (INDEPENDENT_AMBULATORY_CARE_PROVIDER_SITE_OTHER): Payer: Medicare Other | Admitting: Internal Medicine

## 2011-11-17 VITALS — BP 122/70 | HR 68 | Temp 97.0°F | Resp 16 | Wt 190.0 lb

## 2011-11-17 DIAGNOSIS — R209 Unspecified disturbances of skin sensation: Secondary | ICD-10-CM

## 2011-11-17 DIAGNOSIS — F0390 Unspecified dementia without behavioral disturbance: Secondary | ICD-10-CM

## 2011-11-17 DIAGNOSIS — R200 Anesthesia of skin: Secondary | ICD-10-CM

## 2011-11-17 NOTE — Patient Instructions (Signed)
change in sensation of the left hand and some mild change in sensation right hand: this is NOT angina - no indication of any heart problem. Your neurologic exam is normal. In addition, being coumadin is the best preventive and treatment regimen available. There is no indication for brain imaging because it is very unlikely to reveal any significant findings AND we don't have anything else to offer beyond the protection offered by the coumadin.  Memory loss- the medications SLOW progression of memory loss but it does not stop progression. You are on maximum therapy already.   Call me if your symptoms get worse. Be careful about using your hand, don't want to drop things.

## 2011-11-17 NOTE — Progress Notes (Signed)
Subjective:    Patient ID: [name], male    DOB: 1929/03/16, 78 y.o.   MRN: [id number]  HPI [name] presents as a walk-in for sensory change in his UE: left hand doesn't hurt but doesn't seem to be his hand. Having the beginnings of similar sensation in the right hand. He does not have any chest pain or discomfort. He has seemed to be a little more lethargic per his wife.   Past Medical History  Diagnosis Date  . Malignant neoplasm of prostate   . Unspecified psychosis   . Cardiac pacemaker [employer]   . Atrial fibrillation   . Pain in limb   . Encounter for long-term (current) use of other medications   . Unspecified essential hypertension   . Benign neoplasm of colon   . BPH (benign prostatic hypertrophy)   . Other and unspecified hyperlipidemia   . Other acquired absence of organ   . Unspecified hemorrhoids without mention of complication   . Diverticulosis of colon (without mention of hemorrhage)   . History of vein stripping   . Thrombocytosis 07/09/2011  . Normocytic anemia 07/09/2011  . TIA (transient ischemic attack) 07/09/2011   Past Surgical History  Procedure Date  . Pacemaker placement 1999, replaced 2008    hx tachy-brady  . Vein stripping laser   . Tonsillectomy and adenoidectomy    Family History  Problem Relation Age of Onset  . Diabetes Neg Hx   . Hypertension Neg Hx   . Coronary artery disease Mother   . Coronary artery disease Sister    History   Social History  . Marital Status: Married    Spouse Name: N/A    Number of Children: N/A  . Years of Education: 16   Occupational History  . Retired   . President of an [employer]    Social History Main Topics  . Smoking status: Former Smoker -- 0.5 packs/day for 8 years    Types: Cigarettes  . Smokeless tobacco: Not on file  . Alcohol Use: 1.5 oz/week    3 drink(s) per week     beer only 3 cans a week  . Drug Use: No  . Sexually Active: Not on file   Other Topics Concern    . Not on file   Social History Narrative   Married.  Lives in [place] with his wife.  Ambulates independently.  Walks 4 miles per day.       Review of Systems System review is negative for any constitutional, cardiac, pulmonary, GI or neuro symptoms or complaints other than as described in the HPI.     Objective:   Physical Exam Filed Vitals:   11/17/11 1200  BP: 122/70  Pulse: 68  Temp: 97 F (36.1 C)  Resp: 16   Gen'l- tall, lanky white man in no distress HEENT- C&S clear Cor - RRR, 2+ radial pulse, no carotid bruits Pulm - normal respirations Neuro - A&O x 3, speech clear, cognition OK, memory not tested. CN - nl facial symmetry and movement, no fasciculation of the tongue, nl shoulder shrug. MS normal grip, nl distal and proximal UE strength, able to stand without assistance. DTRs 2+ biceps and radial tendons. Sensation - normal to light touch, pin-prick and deep vibratory sensation. Cerebellar- no tremor, normal rapid finger movement, normal gait and tandem gait.  Chart review: CRT brain 2011 - normal      Assessment & Plan:  change in sensation of the left hand  and some mild change in sensation right hand: this is NOT angina - no indication of any heart problem. Your neurologic exam is normal. In addition, being coumadin is the best preventive and treatment regimen available. There is no indication for brain imaging because it is very unlikely to reveal any significant findings AND we don't have anything else to offer beyond the protection offered by the coumadin.  Memory loss- the medications SLOW progression of memory loss but it does not stop progression. You are on maximum therapy already.

## 2011-11-18 ENCOUNTER — Encounter (HOSPITAL_COMMUNITY): Payer: Self-pay

## 2011-11-18 ENCOUNTER — Emergency Department (HOSPITAL_COMMUNITY): Payer: Medicare Other

## 2011-11-18 ENCOUNTER — Inpatient Hospital Stay (HOSPITAL_COMMUNITY)
Admission: EM | Admit: 2011-11-18 | Discharge: 2011-11-22 | DRG: 064 | Disposition: A | Discharge status: Rehabilitation Facility | Payer: Medicare Other | Source: Ambulatory Visit | Attending: Internal Medicine | Admitting: Internal Medicine

## 2011-11-18 DIAGNOSIS — G8190 Hemiplegia, unspecified affecting unspecified side: Secondary | ICD-10-CM | POA: Diagnosis not present

## 2011-11-18 DIAGNOSIS — I639 Cerebral infarction, unspecified: Secondary | ICD-10-CM

## 2011-11-18 DIAGNOSIS — I4891 Unspecified atrial fibrillation: Secondary | ICD-10-CM

## 2011-11-18 DIAGNOSIS — G459 Transient cerebral ischemic attack, unspecified: Secondary | ICD-10-CM

## 2011-11-18 DIAGNOSIS — Z87891 Personal history of nicotine dependence: Secondary | ICD-10-CM

## 2011-11-18 DIAGNOSIS — D509 Iron deficiency anemia, unspecified: Secondary | ICD-10-CM | POA: Diagnosis present

## 2011-11-18 DIAGNOSIS — I10 Essential (primary) hypertension: Secondary | ICD-10-CM | POA: Diagnosis present

## 2011-11-18 DIAGNOSIS — E785 Hyperlipidemia, unspecified: Secondary | ICD-10-CM

## 2011-11-18 DIAGNOSIS — D72829 Elevated white blood cell count, unspecified: Secondary | ICD-10-CM | POA: Diagnosis present

## 2011-11-18 DIAGNOSIS — Z79899 Other long term (current) drug therapy: Secondary | ICD-10-CM

## 2011-11-18 DIAGNOSIS — Z7901 Long term (current) use of anticoagulants: Secondary | ICD-10-CM

## 2011-11-18 DIAGNOSIS — Z8546 Personal history of malignant neoplasm of prostate: Secondary | ICD-10-CM

## 2011-11-18 DIAGNOSIS — G936 Cerebral edema: Secondary | ICD-10-CM | POA: Diagnosis not present

## 2011-11-18 DIAGNOSIS — G309 Alzheimer's disease, unspecified: Secondary | ICD-10-CM

## 2011-11-18 DIAGNOSIS — R29810 Facial weakness: Secondary | ICD-10-CM | POA: Diagnosis not present

## 2011-11-18 DIAGNOSIS — Z950 Presence of cardiac pacemaker: Secondary | ICD-10-CM

## 2011-11-18 DIAGNOSIS — D7289 Other specified disorders of white blood cells: Secondary | ICD-10-CM

## 2011-11-18 DIAGNOSIS — F0390 Unspecified dementia without behavioral disturbance: Secondary | ICD-10-CM

## 2011-11-18 DIAGNOSIS — F0280 Dementia in other diseases classified elsewhere without behavioral disturbance: Secondary | ICD-10-CM

## 2011-11-18 DIAGNOSIS — I6350 Cerebral infarction due to unspecified occlusion or stenosis of unspecified cerebral artery: Principal | ICD-10-CM | POA: Diagnosis present

## 2011-11-18 DIAGNOSIS — Z8673 Personal history of transient ischemic attack (TIA), and cerebral infarction without residual deficits: Secondary | ICD-10-CM

## 2011-11-18 DIAGNOSIS — I619 Nontraumatic intracerebral hemorrhage, unspecified: Secondary | ICD-10-CM | POA: Diagnosis not present

## 2011-11-18 DIAGNOSIS — N400 Enlarged prostate without lower urinary tract symptoms: Secondary | ICD-10-CM | POA: Diagnosis present

## 2011-11-18 LAB — URINE MICROSCOPIC-ADD ON

## 2011-11-18 LAB — URINALYSIS, ROUTINE W REFLEX MICROSCOPIC
Glucose, UA: NEGATIVE mg/dL
Hgb urine dipstick: NEGATIVE
Specific Gravity, Urine: 1.025 (ref 1.005–1.030)
pH: 5 (ref 5.0–8.0)

## 2011-11-18 LAB — POCT I-STAT, CHEM 8
Calcium, Ion: 1.19 mmol/L (ref 1.12–1.32)
Chloride: 106 mEq/L (ref 96–112)
HCT: 36 % — ABNORMAL LOW (ref 39.0–52.0)
Hemoglobin: 12.2 g/dL — ABNORMAL LOW (ref 13.0–17.0)
TCO2: 25 mmol/L (ref 0–100)

## 2011-11-18 LAB — RAPID URINE DRUG SCREEN, HOSP PERFORMED
Amphetamines: NOT DETECTED
Benzodiazepines: NOT DETECTED
Cocaine: NOT DETECTED
Opiates: NOT DETECTED

## 2011-11-18 LAB — DIFFERENTIAL
Band Neutrophils: 0 % (ref 0–10)
Eosinophils Absolute: 0.2 10*3/uL (ref 0.0–0.7)
Eosinophils Relative: 1 % (ref 0–5)
Metamyelocytes Relative: 0 %
Monocytes Absolute: 1.5 10*3/uL — ABNORMAL HIGH (ref 0.1–1.0)
Monocytes Relative: 8 % (ref 3–12)

## 2011-11-18 LAB — CK TOTAL AND CKMB (NOT AT ARMC)
CK, MB: 2.8 ng/mL (ref 0.3–4.0)
Relative Index: INVALID (ref 0.0–2.5)

## 2011-11-18 LAB — COMPREHENSIVE METABOLIC PANEL
ALT: 14 U/L (ref 0–53)
Calcium: 9.3 mg/dL (ref 8.4–10.5)
GFR calc Af Amer: 71 mL/min — ABNORMAL LOW (ref >90)
Glucose, Bld: 105 mg/dL — ABNORMAL HIGH (ref 70–99)
Sodium: 138 mEq/L (ref 135–145)
Total Protein: 6.8 g/dL (ref 6.0–8.3)

## 2011-11-18 LAB — GLUCOSE, CAPILLARY: Glucose-Capillary: 103 mg/dL — ABNORMAL HIGH (ref 70–99)

## 2011-11-18 LAB — CBC
HCT: 33 % — ABNORMAL LOW (ref 39.0–52.0)
MCV: 95.1 fL (ref 78.0–100.0)
RBC: 3.47 MIL/uL — ABNORMAL LOW (ref 4.22–5.81)
WBC: 18.8 10*3/uL — ABNORMAL HIGH (ref 4.0–10.5)

## 2011-11-18 LAB — TROPONIN I: Troponin I: <0.3 ng/mL (ref <0.30)

## 2011-11-18 MED ORDER — MEMANTINE HCL 10 MG PO TABS
10.0000 mg | ORAL_TABLET | Freq: Two times a day (BID) | ORAL | Status: DC
  Administered 2011-11-19 – 2011-11-22 (×8): 10 mg via ORAL
  Filled 2011-11-18 (×9): qty 1

## 2011-11-18 MED ORDER — ATENOLOL 50 MG PO TABS
50.0000 mg | ORAL_TABLET | Freq: Every day | ORAL | Status: DC
  Administered 2011-11-19 – 2011-11-22 (×4): 50 mg via ORAL
  Filled 2011-11-18 (×4): qty 1

## 2011-11-18 MED ORDER — DONEPEZIL HCL 10 MG PO TABS
10.0000 mg | ORAL_TABLET | Freq: Every day | ORAL | Status: DC
  Administered 2011-11-19 – 2011-11-21 (×3): 10 mg via ORAL
  Filled 2011-11-18 (×4): qty 1

## 2011-11-18 MED ORDER — WARFARIN - PHARMACIST DOSING INPATIENT: Freq: Every day | Status: DC

## 2011-11-18 NOTE — ED Notes (Signed)
EMS rreports patient from home, c/o abnormal behavior starting at 1730, reaching for awallet that wasn't there, left facial droop, left weakness and drift, CBG 111 bp 134/88

## 2011-11-18 NOTE — Code Documentation (Signed)
Patient arrived to [facility] via [facility]Ms at 1817, code stroke called at 83, EDP seen at 41, stroke team at 1819, CT scan at 59, lab at 1817, CT read at 2.  As per EMS, LSN 1730, wife noticed patient fumbling with wallet and facial droop.  NIHSS 4, pt has history of dementia.  Cancelled at 1850

## 2011-11-18 NOTE — ED Provider Notes (Signed)
History     CSN: [id number]  Arrival date & time 11/18/11  7829   First MD Initiated Contact with Patient 11/18/11 1919      Chief Complaint  Patient presents with  . Code Stroke    (Consider location/radiation/quality/duration/timing/severity/associated sxs/prior treatment) Patient is a 78 y.o. male presenting with neurologic complaint. The history is provided by the patient, the EMS personnel and a relative.  Neurologic Problem The primary symptoms include altered mental status. Primary symptoms do not include headaches, fever, nausea or vomiting. Episode onset: 5:30 pm. The symptoms are improving. The neurological symptoms are multifocal. Context: while sitting at home.  Episode onset: mild confusion for greater than 1 yr, but acute worsening this evening. The altered mental status developed suddenly. The change in mental status has been gradually improving since its onset. The change in mental status includes impaired memory and confusion (weakness).    Past Medical History  Diagnosis Date  . Malignant neoplasm of prostate   . Unspecified psychosis   . Cardiac pacemaker [employer]   . Atrial fibrillation   . Pain in limb   . Encounter for long-term (current) use of other medications   . Unspecified essential hypertension   . Benign neoplasm of colon   . BPH (benign prostatic hypertrophy)   . Other and unspecified hyperlipidemia   . Other acquired absence of organ   . Unspecified hemorrhoids without mention of complication   . Diverticulosis of colon (without mention of hemorrhage)   . History of vein stripping   . Thrombocytosis 07/09/2011  . Normocytic anemia 07/09/2011  . TIA (transient ischemic attack) 07/09/2011    Past Surgical History  Procedure Date  . Pacemaker placement 1999, replaced 2008    hx tachy-brady  . Vein stripping laser   . Tonsillectomy and adenoidectomy     Family History  Problem Relation Age of Onset  . Diabetes Neg Hx   .  Hypertension Neg Hx   . Coronary artery disease Mother   . Coronary artery disease Sister     History  Substance Use Topics  . Smoking status: Former Smoker -- 0.5 packs/day for 8 years    Types: Cigarettes  . Smokeless tobacco: Not on file  . Alcohol Use: 1.5 oz/week    3 drink(s) per week     beer only 3 cans a week      Review of Systems  Constitutional: Negative for fever.  HENT: Negative for rhinorrhea, drooling and neck pain.   Eyes: Negative for pain.  Respiratory: Negative for cough and shortness of breath.   Cardiovascular: Negative for chest pain and leg swelling.  Gastrointestinal: Negative for nausea, vomiting, abdominal pain and diarrhea.  Genitourinary: Negative for dysuria and hematuria.  Musculoskeletal: Negative for gait problem.  Skin: Negative for color change.  Neurological: Negative for facial asymmetry, speech difficulty, numbness and headaches.  Hematological: Negative for adenopathy.  Psychiatric/Behavioral: Positive for altered mental status. Negative for behavioral problems.  All other systems reviewed and are negative.    Allergies  Review of patient's allergies indicates no known allergies.  Home Medications   Current Outpatient Rx  Name Route Sig Dispense Refill  . ATENOLOL 50 MG PO TABS Oral Take 50 mg by mouth daily.    . DONEPEZIL HCL 10 MG PO TABS Oral Take 10 mg by mouth at bedtime.    Marland Kitchen MEMANTINE HCL 10 MG PO TABS Oral Take 10 mg by mouth 2 (two) times daily.    Barron Alvine  SODIUM 5 MG PO TABS Oral Take 5 mg by mouth See admin instructions. Take as directed      BP 147/87  Pulse 58  Temp(Src) 98 F (36.7 C) (Oral)  Resp 18  SpO2 96%  Physical Exam  Nursing note and vitals reviewed. Constitutional: He appears well-developed and well-nourished.  HENT:  Head: Normocephalic and atraumatic.  Right Ear: External ear normal.  Left Ear: External ear normal.  Nose: Nose normal.  Mouth/Throat: Oropharynx is clear and moist. No  oropharyngeal exudate.  Eyes: Conjunctivae and EOM are normal. Pupils are equal, round, and reactive to light.  Neck: Normal range of motion. Neck supple.  Cardiovascular: Normal rate, regular rhythm, normal heart sounds and intact distal pulses.  Exam reveals no gallop and no friction rub.   No murmur heard. Pulmonary/Chest: Effort normal and breath sounds normal. No respiratory distress. He has no wheezes.  Abdominal: Soft. Bowel sounds are normal. He exhibits no distension. There is no tenderness.  Musculoskeletal: Normal range of motion. He exhibits no edema and no tenderness.  Neurological: He is alert.       A/O x 1. On arrival pt had LUE pronator drift and asymmetry to grip, w/ left grip weaker than right. After CT head, on re-exam, these sx have resolved.  Skin: Skin is warm and dry.  Psychiatric: He has a normal mood and affect. His behavior is normal.    ED Course  Procedures (including critical care time)  Labs Reviewed  PROTIME-INR - Abnormal; Notable for the following:    Prothrombin Time 31.8 (*)    INR 3.02 (*)    All other components within normal limits  APTT - Abnormal; Notable for the following:    aPTT 47 (*)    All other components within normal limits  CBC - Abnormal; Notable for the following:    WBC 18.8 (*) ADJUSTED FOR NUCLEATED RBC'S   RBC 3.47 (*)    Hemoglobin 11.2 (*)    HCT 33.0 (*)    RDW 25.8 (*)    Platelets 568 (*)    All other components within normal limits  DIFFERENTIAL - Abnormal; Notable for the following:    Neutro Abs 13.5 (*)    Monocytes Absolute 1.5 (*)    All other components within normal limits  COMPREHENSIVE METABOLIC PANEL - Abnormal; Notable for the following:    Glucose, Bld 105 (*)    BUN 24 (*)    Total Bilirubin 1.5 (*)    GFR calc non Af Amer 61 (*)    GFR calc Af Amer 71 (*)    All other components within normal limits  POCT I-STAT, CHEM 8 - Abnormal; Notable for the following:    BUN 24 (*)    Glucose, Bld 103 (*)     Hemoglobin 12.2 (*)    HCT 36.0 (*)    All other components within normal limits  CK TOTAL AND CKMB  TROPONIN I   Ct Head Wo Contrast  11/18/2011  *RADIOLOGY REPORT*  Clinical Data: Altered mental status.  Left-sided weakness.  CT HEAD WITHOUT CONTRAST  Technique:  Contiguous axial images were obtained from the base of the skull through the vertex without contrast.  Comparison: 07/09/2011  Findings: There is atrophy and chronic small vessel disease changes.  No acute intracranial abnormality.  Specifically, no hemorrhage, hydrocephalus, mass lesion, acute infarction, or significant intracranial injury.  No acute calvarial abnormality.  IMPRESSION: No acute intracranial abnormality.  Critical Value/emergent results were called  by telephone at the time of interpretation on 11/18/2011  at 6:38 p.m.  to  the Code stroke neurologist, who verbally acknowledged these results.  Original Report Authenticated By: Cyndie Chime, M.D.     No diagnosis found.   Date: 11/19/2011  Rate: 66  Rhythm: paced  QRS Axis: indeterminate  Intervals: indeterminate  ST/T Wave abnormalities: indeterminate  Conduction Disutrbances:none  Narrative Interpretation: Indeterminate d/t paced rhythm  Old EKG Reviewed: changes noted    MDM  7:21 PM 78 y.o. male w hx of afib on coumadin, TIA pw confusion and LUE weakness noted at approx 5:30pm this evening. Pt presented as code stroke, had neg CT of head. Initially pt had mild LUE weakness and pronator drift in LUE. Pt AFVSS, sx resolving, neuro recommending admission for TIA.   Consulted hospitalist for admission.   Clinical Impression 1. TIA (transient ischemic attack)            Purvis Sheffield, MD 11/19/11 0005

## 2011-11-18 NOTE — Consult Note (Signed)
Chief Complaint: Acute onset of visual difficulty on the left side as well as weakness and numbness of left extremities.  HPI: [name] is an 78 y.o. male history of atrial fibrillation on Coumadin, cardiac pacemaker, previous TIA and dementia, presenting with acute onset of visual difficulty on the left side as well as weakness and numbness involving his left extremities starting at 5:30 PM today. INR today was 3.02. CT scan of his head showed no acute intracranial abnormality. NIH stroke score was 4. Patient's visual difficulty on the left began to resolve while in the emergency room. Repeat NIH score was 3. Patient was not a candidate for thrombolytic therapy because of his therapeutic INR.  LSN: 5:30 PM today tPA Given: No: Therapeutic range of INR MRankin: 0  Past Medical History  Diagnosis Date  . Malignant neoplasm of prostate   . Unspecified psychosis   . Cardiac pacemaker [employer]   . Atrial fibrillation   . Pain in limb   . Encounter for long-term (current) use of other medications   . Unspecified essential hypertension   . Benign neoplasm of colon   . BPH (benign prostatic hypertrophy)   . Other and unspecified hyperlipidemia   . Other acquired absence of organ   . Unspecified hemorrhoids without mention of complication   . Diverticulosis of colon (without mention of hemorrhage)   . History of vein stripping   . Thrombocytosis 07/09/2011  . Normocytic anemia 07/09/2011  . TIA (transient ischemic attack) 07/09/2011    Family History  Problem Relation Age of Onset  . Diabetes Neg Hx   . Hypertension Neg Hx   . Coronary artery disease Mother   . Coronary artery disease Sister      Medications: Prior to Admission:  Coumadin 5 mg per day Tenormin 50 mg per day Aricept 10 mg at bedtime Namenda 10 mg twice a day  Physical Examination: Blood pressure 147/87, pulse 58, temperature 98 F (36.7 C), temperature source Oral, resp. rate 18, SpO2  96.00%.  Neurologic Examination: Mental Status: Alert, oriented, thought content appropriate.  Speech fluent without evidence of aphasia. Able to follow commands with little difficulty. Cranial Nerves: II-Visual fields normal to finger counting following improvement, but not quite as clear on the left compared to the right. III/IV/VI-Pupils were equal and reacted. Extraocular movements were full and conjugate.    V/VII-no facial numbness and no facial weakness. VIII-normal. X-normal speech. XII-midline tongue extension Motor: 5/5 bilaterally with normal tone and bulk Sensory: Equivocal decrease in tactile sensation over left leg compared to the right.. Deep Tendon Reflexes: 2+ and symmetric. Plantars: Left Babinski sign Cerebellar: Normal finger-to-nose testing. Carotid auscultation: Normal   Ct Head Wo Contrast  11/18/2011  *RADIOLOGY REPORT*  Clinical Data: Altered mental status.  Left-sided weakness.  CT HEAD WITHOUT CONTRAST  Technique:  Contiguous axial images were obtained from the base of the skull through the vertex without contrast.  Comparison: 07/09/2011  Findings: There is atrophy and chronic small vessel disease changes.  No acute intracranial abnormality.  Specifically, no hemorrhage, hydrocephalus, mass lesion, acute infarction, or significant intracranial injury.  No acute calvarial abnormality.  IMPRESSION: No acute intracranial abnormality.  Critical Value/emergent results were called by telephone at the time of interpretation on 11/18/2011  at 6:38 p.m.  to  the Code stroke neurologist, who verbally acknowledged these results.  Original Report Authenticated By: Cyndie Chime, M.D.    Assessment: 78 y.o. male with history of atrial fibrillation on therapeutic anticoagulation  with Coumadin, presenting with probable recurrent transient ischemic attack involving the right MCA territory with rapidly resolving deficits. Acute stroke cannot be ruled out at this point  however.  Stroke Risk Factors - atrial fibrillation  Plan: 1. HgbA1c, fasting lipid panel 2. PT consult, OT consult 3. Echocardiogram 4. Carotid dopplers 5. Prophylactic therapy-Anticoagulation: Coumadin 5 mg per day 6. Risk factor modification 7 Telemetry monitoring  C.R. Roseanne Reno, MD [facility] [phone]  11/18/2011, 6:59 PM

## 2011-11-18 NOTE — Progress Notes (Signed)
ANTICOAGULATION CONSULT NOTE - Initial Consult  Pharmacy Consult for Coumadin Indication: atrial fibrillation  No Known Allergies  Patient Measurements: Height: 5' 5.5" (166.4 cm) Weight: 186 lb 8.2 oz (84.6 kg) IBW/kg (Calculated) : 62.65   Vital Signs: Temp: 97.7 F (36.5 C) (06/01 2240) Temp src: Oral (06/01 2240) BP: 147/62 mmHg (06/01 2240) Pulse Rate: 67  (06/01 2240)  Labs:  Basename 11/18/11 1834 11/18/11 1822  HGB 12.2* 11.2*  HCT 36.0* 33.0*  PLT -- 568*  APTT -- 47*  LABPROT -- 31.8*  INR -- 3.02*  HEPARINUNFRC -- --  CREATININE 1.20 1.10  CKTOTAL -- 80  CKMB -- 2.8  TROPONINI -- <0.30    Estimated Creatinine Clearance: 48.8 ml/min (by C-G formula based on Cr of 1.2).   Medical History: Past Medical History  Diagnosis Date  . Malignant neoplasm of prostate   . Unspecified psychosis   . Cardiac pacemaker [employer]   . Atrial fibrillation   . Pain in limb   . Encounter for long-term (current) use of other medications   . Unspecified essential hypertension   . BPH (benign prostatic hypertrophy)   . Other and unspecified hyperlipidemia   . Other acquired absence of organ   . Unspecified hemorrhoids without mention of complication   . Diverticulosis of colon (without mention of hemorrhage)   . History of vein stripping   . Thrombocytosis 07/09/2011  . Normocytic anemia 07/09/2011  . TIA (transient ischemic attack) 07/09/2011  . Benign neoplasm of colon     Assessment: 59 YOM with history of afib on chronic coumadin presented with abnormal behavior and left facial droop, admitted for stroke work up. Pharmacy is consulted to dose coumadin. Patient home dose is 5mg  daily, INR is 3.02, last dose was 5/31, hgb, plt wnl.   Goal of Therapy:  INR 2-3 Monitor platelets by anticoagulation protocol: Yes   Plan:  - hold coumadin tonight - f/u INR in Am   Bayard Hugger, PharmD, BCPS  Clinical Pharmacist  Pager: [phone]  11/18/2011,11:03 PM

## 2011-11-18 NOTE — ED Notes (Addendum)
Pt encouraged to give urine.  Xray at bedside.

## 2011-11-18 NOTE — ED Notes (Signed)
Attempted to call report.  RN Miles Costain unavailable and will call me in 5 minutes, per Diplomatic Services operational officer.

## 2011-11-18 NOTE — H&P (Signed)
[name] is an 78 y.o. male.  PCP - Dr.Norins.  Chief Complaint: Abnormal behavior and left facial droop. HPI: 78 year-old male history of atrial fibrillation status post pacemaker on Coumadin, dementia was found by his wife at around 5:30 PM patient was behaving abnormally trying to reach something which was not there and also found patient having some left facial droop and patient eventually complaining of numbness of his left hand. Patient did complain of numbness of both upper extremities a day ago. Patient was brought to the ER as code stroke. Neurologist on-call was consulted. CT of the head did not show anything acute. In addition ER physician noticed patient initially had some left eye visual decreased activity and also left hand decreased hand grip strength and pronator drift on the left side upper extremity. All these symptoms resolved fast. As per neurologist patient is to be admitted for further observation and is not a candidate for TPA as symptoms have improved and also therapeutic INR.  Past Medical History  Diagnosis Date  . Malignant neoplasm of prostate   . Unspecified psychosis   . Cardiac pacemaker [employer]   . Atrial fibrillation   . Pain in limb   . Encounter for long-term (current) use of other medications   . Unspecified essential hypertension   . Benign neoplasm of colon   . BPH (benign prostatic hypertrophy)   . Other and unspecified hyperlipidemia   . Other acquired absence of organ   . Unspecified hemorrhoids without mention of complication   . Diverticulosis of colon (without mention of hemorrhage)   . History of vein stripping   . Thrombocytosis 07/09/2011  . Normocytic anemia 07/09/2011  . TIA (transient ischemic attack) 07/09/2011    Past Surgical History  Procedure Date  . Pacemaker placement 1999, replaced 2008    hx tachy-brady  . Vein stripping laser   . Tonsillectomy and adenoidectomy     Family History  Problem Relation Age of Onset   . Diabetes Neg Hx   . Hypertension Neg Hx   . Coronary artery disease Mother   . Coronary artery disease Sister    Social History:  reports that he has quit smoking. His smoking use included Cigarettes. He has a 4 pack-year smoking history. He does not have any smokeless tobacco history on file. He reports that he drinks about 1.5 ounces of alcohol per week. He reports that he does not use illicit drugs.  Allergies: No Known Allergies   (Not in a hospital admission)  Results for orders placed during the hospital encounter of 11/18/11 (from the past 48 hour(s))  PROTIME-INR     Status: Abnormal   Collection Time   11/18/11  6:22 PM      Component Value Range Comment   Prothrombin Time 31.8 (*) 11.6 - 15.2 (seconds)    INR 3.02 (*) 0.00 - 1.49    APTT     Status: Abnormal   Collection Time   11/18/11  6:22 PM      Component Value Range Comment   aPTT 47 (*) 24 - 37 (seconds)   CBC     Status: Abnormal   Collection Time   11/18/11  6:22 PM      Component Value Range Comment   WBC 18.8 (*) 4.0 - 10.5 (K/uL) ADJUSTED FOR NUCLEATED RBC'S   RBC 3.47 (*) 4.22 - 5.81 (MIL/uL)    Hemoglobin 11.2 (*) 13.0 - 17.0 (g/dL)    HCT 16.1 (*) 09.6 -  52.0 (%)    MCV 95.1  78.0 - 100.0 (fL)    MCH 32.3  26.0 - 34.0 (pg)    MCHC 33.9  30.0 - 36.0 (g/dL)    RDW 16.1 (*) 09.6 - 15.5 (%)    Platelets 568 (*) 150 - 400 (K/uL)   DIFFERENTIAL     Status: Abnormal   Collection Time   11/18/11  6:22 PM      Component Value Range Comment   Neutrophils Relative 72  43 - 77 (%)    Lymphocytes Relative 19  12 - 46 (%)    Monocytes Relative 8  3 - 12 (%)    Eosinophils Relative 1  0 - 5 (%)    Basophils Relative 0  0 - 1 (%)    Band Neutrophils 0  0 - 10 (%)    Metamyelocytes Relative 0      Myelocytes 0      Promyelocytes Absolute 0      Blasts 0      nRBC 0  0 (/100 WBC)    Neutro Abs 13.5 (*) 1.7 - 7.7 (K/uL)    Lymphs Abs 3.6  0.7 - 4.0 (K/uL)    Monocytes Absolute 1.5 (*) 0.1 - 1.0 (K/uL)     Eosinophils Absolute 0.2  0.0 - 0.7 (K/uL)    Basophils Absolute 0.0  0.0 - 0.1 (K/uL)    RBC Morphology POLYCHROMASIA PRESENT     COMPREHENSIVE METABOLIC PANEL     Status: Abnormal   Collection Time   11/18/11  6:22 PM      Component Value Range Comment   Sodium 138  135 - 145 (mEq/L)    Potassium 4.1  3.5 - 5.1 (mEq/L)    Chloride 105  96 - 112 (mEq/L)    CO2 22  19 - 32 (mEq/L)    Glucose, Bld 105 (*) 70 - 99 (mg/dL)    BUN 24 (*) 6 - 23 (mg/dL)    Creatinine, Ser 0.45  0.50 - 1.35 (mg/dL)    Calcium 9.3  8.4 - 10.5 (mg/dL)    Total Protein 6.8  6.0 - 8.3 (g/dL)    Albumin 4.1  3.5 - 5.2 (g/dL)    AST 26  0 - 37 (U/L)    ALT 14  0 - 53 (U/L)    Alkaline Phosphatase 65  39 - 117 (U/L)    Total Bilirubin 1.5 (*) 0.3 - 1.2 (mg/dL)    GFR calc non Af Amer 61 (*) >90 (mL/min)    GFR calc Af Amer 71 (*) >90 (mL/min)   CK TOTAL AND CKMB     Status: Normal   Collection Time   11/18/11  6:22 PM      Component Value Range Comment   Total CK 80  7 - 232 (U/L)    CK, MB 2.8  0.3 - 4.0 (ng/mL)    Relative Index RELATIVE INDEX IS INVALID  0.0 - 2.5    TROPONIN I     Status: Normal   Collection Time   11/18/11  6:22 PM      Component Value Range Comment   Troponin I <0.30  <0.30 (ng/mL)   POCT I-STAT, CHEM 8     Status: Abnormal   Collection Time   11/18/11  6:34 PM      Component Value Range Comment   Sodium 144  135 - 145 (mEq/L)    Potassium 4.1  3.5 - 5.1 (mEq/L)  Chloride 106  96 - 112 (mEq/L)    BUN 24 (*) 6 - 23 (mg/dL)    Creatinine, Ser 9.60  0.50 - 1.35 (mg/dL)    Glucose, Bld 454 (*) 70 - 99 (mg/dL)    Calcium, Ion 0.98  1.12 - 1.32 (mmol/L)    TCO2 25  0 - 100 (mmol/L)    Hemoglobin 12.2 (*) 13.0 - 17.0 (g/dL)    HCT 11.9 (*) 14.7 - 52.0 (%)   URINALYSIS, ROUTINE W REFLEX MICROSCOPIC     Status: Abnormal   Collection Time   11/18/11  9:31 PM      Component Value Range Comment   Color, Urine AMBER (*) YELLOW  BIOCHEMICALS MAY BE AFFECTED BY COLOR   APPearance CLOUDY (*)  CLEAR     Specific Gravity, Urine 1.025  1.005 - 1.030     pH 5.0  5.0 - 8.0     Glucose, UA NEGATIVE  NEGATIVE (mg/dL)    Hgb urine dipstick NEGATIVE  NEGATIVE     Bilirubin Urine SMALL (*) NEGATIVE     Ketones, ur 15 (*) NEGATIVE (mg/dL)    Protein, ur NEGATIVE  NEGATIVE (mg/dL)    Urobilinogen, UA 1.0  0.0 - 1.0 (mg/dL)    Nitrite NEGATIVE  NEGATIVE     Leukocytes, UA TRACE (*) NEGATIVE    URINE MICROSCOPIC-ADD ON     Status: Abnormal   Collection Time   11/18/11  9:31 PM      Component Value Range Comment   Squamous Epithelial / LPF RARE  RARE     WBC, UA 0-2  <3 (WBC/hpf)    Bacteria, UA RARE  RARE     Casts GRANULAR CAST (*) NEGATIVE  HYALINE CASTS   Ct Head Wo Contrast  11/18/2011  *RADIOLOGY REPORT*  Clinical Data: Altered mental status.  Left-sided weakness.  CT HEAD WITHOUT CONTRAST  Technique:  Contiguous axial images were obtained from the base of the skull through the vertex without contrast.  Comparison: 07/09/2011  Findings: There is atrophy and chronic small vessel disease changes.  No acute intracranial abnormality.  Specifically, no hemorrhage, hydrocephalus, mass lesion, acute infarction, or significant intracranial injury.  No acute calvarial abnormality.  IMPRESSION: No acute intracranial abnormality.  Critical Value/emergent results were called by telephone at the time of interpretation on 11/18/2011  at 6:38 p.m.  to  the Code stroke neurologist, who verbally acknowledged these results.  Original Report Authenticated By: Cyndie Chime, M.D.   Dg Chest Port 1 View  11/18/2011  *RADIOLOGY REPORT*  Clinical Data: Altered mental status.  Left-sided visual difficulty, weakness and numbness.  PORTABLE CHEST - 1 VIEW  Comparison: 07/09/2011  Findings: Stable appearance of cardiac pacemaker.  Shallow inspiration.  Borderline heart size and pulmonary vascularity are likely normal for technique.  The no focal airspace consolidation in the lungs.  No blunting of costophrenic angles.   No pneumothorax.  IMPRESSION: Shallow inspiration.  No evidence of active pulmonary disease.  Original Report Authenticated By: Marlon Pel, M.D.    Review of Systems  Constitutional: Negative.   HENT: Negative.   Eyes: Negative.   Respiratory: Negative.   Cardiovascular: Negative.   Gastrointestinal: Negative.   Genitourinary: Negative.   Musculoskeletal: Negative.   Skin: Negative.   Neurological:       Left hand numbness and weakness and left eye visual problems.  Endo/Heme/Allergies: Negative.   Psychiatric/Behavioral: Negative.     Blood pressure 151/91, pulse 68, temperature 98.1 F (  36.7 C), temperature source Oral, resp. rate 18, SpO2 100.00%. Physical Exam  Constitutional: He appears well-developed and well-nourished. No distress.  HENT:  Head: Normocephalic and atraumatic.  Right Ear: External ear normal.  Left Ear: External ear normal.  Nose: Nose normal.  Mouth/Throat: Oropharynx is clear and moist. No oropharyngeal exudate.  Eyes: Conjunctivae are normal. Pupils are equal, round, and reactive to light. Right eye exhibits no discharge. Left eye exhibits no discharge. No scleral icterus.  Neck: Normal range of motion. Neck supple.  Cardiovascular: Normal rate.        Irregular rhythm.  Respiratory: Effort normal and breath sounds normal. No respiratory distress. He has no wheezes. He has no rales.  GI: Soft. Bowel sounds are normal. He exhibits no distension. There is no tenderness. There is no rebound.  Musculoskeletal: Normal range of motion. He exhibits no edema and no tenderness.  Neurological: He is alert.       Oriented to his name and place. Moves all extremities 5/5. Mild left facial droop.  Skin: Skin is warm and dry. He is not diaphoretic.     Assessment/Plan #1. Possible TIA versus CVA - patient will be placed on neuro checks, swallow evaluation. 2-D echo and carotid Doppler. Unable to do MRI due to pacemaker. Patient is on Coumadin and will be  dosed per pharmacy. #2. Atrial fibrillation status post pacemaker rate controlled - continue present medications. Coumadin per pharmacy. #3. Leukocytosis - patient is afebrile. Chest x-ray does not show any infiltrates. Check UA. Follow CBC closely. #4. Dementia - continue home medications.  CODE STATUS - full code.  Eduard Clos. 11/18/2011, 9:59 PM

## 2011-11-19 DIAGNOSIS — G459 Transient cerebral ischemic attack, unspecified: Secondary | ICD-10-CM

## 2011-11-19 LAB — DIFFERENTIAL
Basophils Absolute: 0.2 10*3/uL — ABNORMAL HIGH (ref 0.0–0.1)
Basophils Relative: 1 % (ref 0–1)
Eosinophils Absolute: 0.4 10*3/uL (ref 0.0–0.7)
Lymphocytes Relative: 5 % — ABNORMAL LOW (ref 12–46)
Monocytes Absolute: 2.5 10*3/uL — ABNORMAL HIGH (ref 0.1–1.0)
Neutro Abs: 13.7 10*3/uL — ABNORMAL HIGH (ref 1.7–7.7)

## 2011-11-19 LAB — IRON AND TIBC
Iron: 39 ug/dL — ABNORMAL LOW (ref 42–135)
Saturation Ratios: 16 % — ABNORMAL LOW (ref 20–55)
UIBC: 203 ug/dL (ref 125–400)

## 2011-11-19 LAB — COMPREHENSIVE METABOLIC PANEL
ALT: 13 U/L (ref 0–53)
AST: 23 U/L (ref 0–37)
Alkaline Phosphatase: 59 U/L (ref 39–117)
CO2: 24 mEq/L (ref 19–32)
GFR calc Af Amer: >90 mL/min (ref >90)
GFR calc non Af Amer: 78 mL/min — ABNORMAL LOW (ref >90)
Glucose, Bld: 83 mg/dL (ref 70–99)
Potassium: 3.8 mEq/L (ref 3.5–5.1)
Sodium: 143 mEq/L (ref 135–145)

## 2011-11-19 LAB — LIPID PANEL
Cholesterol: 116 mg/dL (ref 0–200)
Triglycerides: 58 mg/dL (ref <150)
VLDL: 12 mg/dL (ref 0–40)

## 2011-11-19 LAB — FERRITIN: Ferritin: 1006 ng/mL — ABNORMAL HIGH (ref 22–322)

## 2011-11-19 LAB — CBC
HCT: 32.2 % — ABNORMAL LOW (ref 39.0–52.0)
MCH: 32 pg (ref 26.0–34.0)
MCHC: 33.5 g/dL (ref 30.0–36.0)
RDW: 25.8 % — ABNORMAL HIGH (ref 11.5–15.5)

## 2011-11-19 LAB — PROTIME-INR
INR: 2.87 — ABNORMAL HIGH (ref 0.00–1.49)
Prothrombin Time: 30.5 seconds — ABNORMAL HIGH (ref 11.6–15.2)

## 2011-11-19 LAB — FOLATE: Folate: 17 ng/mL

## 2011-11-19 LAB — HEMOGLOBIN A1C: Mean Plasma Glucose: 117 mg/dL — ABNORMAL HIGH (ref <117)

## 2011-11-19 MED ORDER — WARFARIN SODIUM 5 MG PO TABS
5.0000 mg | ORAL_TABLET | Freq: Once | ORAL | Status: AC
  Administered 2011-11-19: 5 mg via ORAL
  Filled 2011-11-19: qty 1

## 2011-11-19 NOTE — Progress Notes (Signed)
I personally reviewed the chart & pertinent data, examined the patient, and developed the plan of care. Patient's main symptoms seem to be some confusion with impaired comprehension. He also complains of throbbing pain in his fingertips. His neurological exam is largely non-focal, with the exception of some word-finding difficulty and impaired comprehension at times. Given his leukocytosis, question if symptoms may be secondary to encephalopathy due to infectious cause. Recommend rule-out of toxic/metabolic/infectious causes for encephalopathy.  Luvenia Heller, MD  Pager: [phone]

## 2011-11-19 NOTE — Evaluation (Signed)
Speech Language Pathology Evaluation Patient Details Name: [name] MRN: [id number] DOB: [birth year] Today's Date: 11/19/2011 Time: 1300-1330 SLP Time Calculation (min): 30 min  Problem List:  Patient Active Problem List  Diagnoses  . POLYP, COLON  . HYPERLIPIDEMIA  . Dementia  . HYPERTENSION  . Atrial fibrillation  . BRADYCARDIA  . HEMORRHOIDS  . DIVERTICULOSIS, COLON  . LEG PAIN, LEFT  . OTHER ABNORMAL BLOOD CHEMISTRY  . BENIGN PROSTATIC HYPERTROPHY, HX OF  . PACEMAKER, PERMANENT  . ADENOCARCINOMA, PROSTATE, GLEASON GRADE 4  . Hyperglycemia  . Numbness and tingling in left hand  . TIA (transient ischemic attack)   Past Medical History:  Past Medical History  Diagnosis Date  . Malignant neoplasm of prostate   . Unspecified psychosis   . Cardiac pacemaker [employer]   . Atrial fibrillation   . Pain in limb   . Encounter for long-term (current) use of other medications   . Unspecified essential hypertension   . BPH (benign prostatic hypertrophy)   . Other and unspecified hyperlipidemia   . Other acquired absence of organ   . Unspecified hemorrhoids without mention of complication   . Diverticulosis of colon (without mention of hemorrhage)   . History of vein stripping   . Thrombocytosis 07/09/2011  . Normocytic anemia 07/09/2011  . TIA (transient ischemic attack) 07/09/2011  . Benign neoplasm of colon    Past Surgical History:  Past Surgical History  Procedure Date  . Pacemaker placement 1999, replaced 2008    hx tachy-brady  . Vein stripping laser   . Tonsillectomy and adenoidectomy    HPI:  [name] is an 78 y.o. male history of atrial fibrillation on Coumadin, cardiac pacemaker, previous TIA and dementia, presenting with acute onset of visual difficulty on the left side as well as weakness and numbness involving his left extremities starting at 5:30 PM 11/18/11. INR  was 3.02. CT scan of his head showed no acute intracranial abnormality. NIH stroke  score was 4. Patient's visual difficulty on the left began to resolve while in the emergency room. Repeat NIH score was 3. Patient was not a candidate for thrombolytic therapy because of his therapeutic INR. Patient referred for SLE per stroke protocol.    Assessment / Plan / Recommendation Clinical Impression  Moderate cognitive deficits in areas of long term memory, short term memory, working memory, problem solving, awareness and attention.  Patient's spouse indicates increase in confusion and decrease in problem solving skills current episode.  Recommend ST treatment in next venue of care in outpatient setting to assess Cognitive Linguistic skills.      SLP Assessment  All further Speech Lanaguage Pathology  needs can be addressed in the next venue of care    Follow Up Recommendations  Outpatient SLP            SLP Evaluation Prior Functioning  Cognitive/Linguistic Baseline: Baseline deficits Baseline deficit details: Per spouse report patient with increased confusion this episode with decreased problem solving skills.  Type of Home: House Lives With: Spouse Available Help at Discharge: Family Education: Engineer, maintenance (IT) Vocation: Retired   IT consultant  Overall Cognitive Status: Impaired Arousal/Alertness: Awake/alert Orientation Level: Oriented to person Attention: Focused;Sustained Focused Attention: Impaired Focused Attention Impairment: Verbal basic;Functional basic Sustained Attention: Impaired Sustained Attention Impairment: Verbal basic;Functional basic Memory: Impaired Memory Impairment: Retrieval deficit;Decreased recall of new information;Decreased long term memory;Decreased short term memory Decreased Long Term Memory: Verbal basic;Functional basic Decreased Short Term Memory: Verbal basic;Functional basic Awareness: Impaired  Awareness Impairment: Intellectual impairment;Emergent impairment;Anticipatory impairment Problem Solving: Impaired Problem Solving  Impairment: Verbal basic;Functional basic Executive Function: Sequencing;Organizing;Decision Making;Initiating;Self Monitoring;Self Correcting;Reasoning Reasoning: Impaired Reasoning Impairment: Verbal basic;Functional basic Sequencing: Appears intact Sequencing Impairment: Functional basic;Verbal basic Organizing: Impaired Organizing Impairment: Verbal basic;Functional basic Decision Making: Impaired Decision Making Impairment: Verbal basic;Functional basic Initiating: Impaired Initiating Impairment: Verbal basic;Functional basic Self Monitoring: Impaired Self Monitoring Impairment: Verbal basic;Functional basic Self Correcting: Impaired Behaviors: Other (comment) Safety/Judgment: Other (comment) (flat affect)    Comprehension  Auditory Comprehension Overall Auditory Comprehension: Impaired Yes/No Questions: Impaired Basic Biographical Questions: 0-25% accurate Basic Immediate Environment Questions: 0-24% accurate Commands: Impaired Two Step Basic Commands: 50-74% accurate Interfering Components: Attention;Hearing;Working Radio broadcast assistant: Extra processing time;Increased volume;Repetition Visual Recognition/Discrimination Discrimination: Not tested Reading Comprehension Reading Status: Not tested    Expression Expression Primary Mode of Expression: Verbal Verbal Expression Overall Verbal Expression: Impaired at baseline Initiation: Impaired Automatic Speech: Name;Social Response;Counting;Day of week;Month of year Level of Generative/Spontaneous Verbalization: Sentence Interfering Components: Attention Non-Verbal Means of Communication: Not applicable Written Expression Written Expression: Not tested   Oral / Motor Oral Motor/Sensory Function Overall Oral Motor/Sensory Function: Appears within functional limits for tasks assessed    Moreen Fowler MS, CCC-SLP [phone] [facility] 11/19/2011, 4:27 PM

## 2011-11-19 NOTE — ED Provider Notes (Signed)
I saw and evaluated the patient, reviewed the resident's note and I agree with the findings and plan. I saw the EKG and agree with residents interpretation. Pt who was initially a code stroke and then canceled due to resolving sx.  Evaluated by neuro and feel needs to have a TIA work up.  Currently no deficits.  Gwyneth Sprout, MD 11/19/11 1402

## 2011-11-19 NOTE — Progress Notes (Addendum)
ANTICOAGULATION CONSULT NOTE - Follow UP  Pharmacy Consult for Coumadin Indication: atrial fibrillation  No Known Allergies  Patient Measurements: Height: 5' 5.5" (166.4 cm) Weight: 186 lb 8.2 oz (84.6 kg) IBW/kg (Calculated) : 62.65   Vital Signs: Temp: 98.7 F (37.1 C) (06/02 1400) Temp src: Oral (06/02 1400) BP: 138/86 mmHg (06/02 1400) Pulse Rate: 64  (06/02 1400)  Labs:  Basename 11/19/11 0525 11/18/11 1834 11/18/11 1822  HGB 10.8* 12.2* --  HCT 32.2* 36.0* 33.0*  PLT 569* -- 568*  APTT -- -- 47*  LABPROT 30.5* -- 31.8*  INR 2.87* -- 3.02*  HEPARINUNFRC -- -- --  CREATININE 0.88 1.20 1.10  CKTOTAL -- -- 80  CKMB -- -- 2.8  TROPONINI -- -- <0.30    Estimated Creatinine Clearance: 66.6 ml/min (by C-G formula based on Cr of 0.88).   Medical History: Past Medical History  Diagnosis Date  . Malignant neoplasm of prostate   . Unspecified psychosis   . Cardiac pacemaker [employer]   . Atrial fibrillation   . Pain in limb   . Encounter for long-term (current) use of other medications   . Unspecified essential hypertension   . BPH (benign prostatic hypertrophy)   . Other and unspecified hyperlipidemia   . Other acquired absence of organ   . Unspecified hemorrhoids without mention of complication   . Diverticulosis of colon (without mention of hemorrhage)   . History of vein stripping   . Thrombocytosis 07/09/2011  . Normocytic anemia 07/09/2011  . TIA (transient ischemic attack) 07/09/2011  . Benign neoplasm of colon     Assessment: 49 YOM with history of afib on chronic coumadin presented with abnormal behavior and left facial droop, admitted for stroke work up. INR is 2.8 after holding dose.  He is within desired goal range today, we will continue his home regimen to avoid falling below desired goal range.   Goal of Therapy:  INR 2-3 Monitor platelets by anticoagulation protocol: Yes   Plan:  - Warfarin 5mg  x 1 tonight - f/u INR in Am   Nadara Mustard, PharmD., MS Clinical Pharmacist Pager:  [phone]  Thank you for allowing pharmacy to be part of this patients care team. 11/19/2011,2:56 PM

## 2011-11-19 NOTE — Progress Notes (Signed)
VASCULAR LAB PRELIMINARY  PRELIMINARY  PRELIMINARY  PRELIMINARY  Patient had normal Carotid Dopplers 07/09/11.  The Vascular Lab generally does not repeat normal studies within a 6 month period. Please advise if you would like the study repeated.   Sherren Kerns Sims, 11/19/2011, 7:21 AM

## 2011-11-19 NOTE — Progress Notes (Signed)
History: [name] is an 78 y.o. male history of atrial fibrillation on Coumadin, cardiac pacemaker, previous TIA and dementia, presenting with acute onset of visual difficulty on the left side as well as weakness and numbness involving his left extremities starting at 5:30 PM 11/18/11. INR  was 3.02. CT scan of his head showed no acute intracranial abnormality. NIH stroke score was 4. Patient's visual difficulty on the left began to resolve while in the emergency room. Repeat NIH score was 3. Patient was not a candidate for thrombolytic therapy because of his therapeutic INR.  LSN: 5:30 PM 11/18/11 tPA Given: No: Therapeutic range of INR  MRankin: 0  Subjective: No complaints except left hand feels different than the right.  Objective: BP 144/81  Pulse 65  Temp(Src) 97.6 F (36.4 C) (Oral)  Resp 20  Ht 5' 5.5" (1.664 m)  Wt 84.6 kg (186 lb 8.2 oz)  BMI 30.56 kg/m2  SpO2 95%  CBGs  Basename 11/18/11 2258  GLUCAP 103*    Diet:   Activity:   DVT Prophylaxis:    Medications: Scheduled:   . atenolol  50 mg Oral Daily  . donepezil  10 mg Oral QHS  . memantine  10 mg Oral BID  . Warfarin - Pharmacist Dosing Inpatient   Does not apply q1800    Neurologic Exam:( due to dementia, subjective portions of exam questionable) Mental Status: Alert, oriented to person and place.  Could not recall year or season.  When told it was June, season was described as 'warm'. Difficulty naming objects and stating their purpose.  Speech fluent. No dysphagia.  Able to follow 3 step commands with some difficulty. Cranial Nerves: II-diminished VA temporal field OS. III/IV/VI-Extraocular movements intact.  V/VII-Smile symmetric VIII-hearing grossly intact IX/X-normal gag XI-bilateral shoulder shrug XII-midline tongue extension Motor: 5/5 bilaterally with normal tone and bulk.  Mild drift Left UE. Sensory: Light touch intact throughout, bilaterally Deep Tendon Reflexes: 2+ and symmetric  throughout Plantars: Downgoing bilaterally Cerebellar: Difficulty with finger to nose on left; not dysmetric? Some neglect? Slightly impaired fine finger movements on the left hand. Lab Results: Basic Metabolic Panel:  Lab 11/19/11 8119 11/18/11 1834 11/18/11 1822  NA 143 144 --  K 3.8 4.1 --  CL 107 106 --  CO2 24 -- 22  GLUCOSE 83 103* --  BUN 19 24* --  CREATININE 0.88 1.20 --  CALCIUM 9.2 -- 9.3  MG -- -- --  PHOS -- -- --   Liver Function Tests:  Lab 11/19/11 0525 11/18/11 1822  AST 23 26  ALT 13 14  ALKPHOS 59 65  BILITOT 1.7* 1.5*  PROT 6.0 6.8  ALBUMIN 3.8 4.1   CBC:  Lab 11/19/11 0525 11/18/11 1834 11/18/11 1822  WBC 17.7* -- 18.8*  NEUTROABS 13.7* -- 13.5*  HGB 10.8* 12.2* --  HCT 32.2* 36.0* --  MCV 95.3 -- 95.1  PLT 569* -- 568*   Cardiac Enzymes:  Lab 11/18/11 1822  CKTOTAL 80  CKMB 2.8  CKMBINDEX --  TROPONINI <0.30   CBG:  Lab 11/18/11 2258  GLUCAP 103*   Fasting Lipid Panel:  Lab 11/19/11 0525  CHOL 116  HDL 47  LDLCALC 57  TRIG 58  CHOLHDL 2.5  LDLDIRECT --   Coagulation:  Lab 11/19/11 0525 11/18/11 1822 11/14/11 1016  LABPROT 30.5* 31.8* --  INR 2.87* 3.02* 2.9   Urine Drug Screen: Drugs of Abuse     Component Value Date/Time   LABOPIA NONE DETECTED 11/18/2011  2155   COCAINSCRNUR NONE DETECTED 11/18/2011 2155   LABBENZ NONE DETECTED 11/18/2011 2155   AMPHETMU NONE DETECTED 11/18/2011 2155   THCU NONE DETECTED 11/18/2011 2155   LABBARB NONE DETECTED 11/18/2011 2155    Urinalysis:  Lab 11/18/11 2131  COLORURINE AMBER*  LABSPEC 1.025  PHURINE 5.0  GLUCOSEU NEGATIVE  HGBUR NEGATIVE  BILIRUBINUR SMALL*  KETONESUR 15*  PROTEINUR NEGATIVE  UROBILINOGEN 1.0  NITRITE NEGATIVE  LEUKOCYTESUR TRACE*   Study Results:  11/18/2011 CT HEAD WITHOUT CONTRAST   Findings: There is atrophy and chronic small vessel disease changes.  No acute intracranial abnormality.  Specifically, no hemorrhage, hydrocephalus, mass lesion, acute  infarction, or significant intracranial injury.  No acute calvarial abnormality.   IMPRESSION: No acute intracranial abnormality.   Cyndie Chime, M.D.   11/18/2011  PORTABLE CHEST - 1 VIEW   Findings: Stable appearance of cardiac pacemaker.  Shallow inspiration.  Borderline heart size and pulmonary vascularity are likely normal for technique.  The no focal airspace consolidation in the lungs.  No blunting of costophrenic angles.  No pneumothorax.  IMPRESSION: Shallow inspiration.  No evidence of active pulmonary disease. Marlon Pel, M.D.   Patient had normal Carotid Dopplers 07/09/11.  Assessment: 78 yo WM with history of atrial fibrillation on Coumadin, cardiac pacemaker, previous TIA and dementia, presenting with acute onset of visual difficulty on the left side as well as weakness and numbness involving his left extremities starting at 5:30 PM 11/18/11. INR was 3.02.  Head Ct was negative, can't do MRI due to pacer.  Exam seems to be consistent with right CVA, with some resolution of symptoms based on comparisons of exams and patient report.  Family not at bedside.  LDL 57  Patient with baseline dementia, and elevated white count, on therapeutic coumadin-  Not entirely convinced that patient has had CVA, perhaps TIA, but if so, is already on maximal medical therapy.  Unlikely that further neuro work-up will change treatment.      Plan: 1.Therapy evals 2. Consider w/u &/or empiric treatment of possible infectious cause  Discussed with Dr. Regino Schultze.  LOS: 1 day   Marya Fossa PA-C [facility] [phone] 11/19/2011  7:31 AM

## 2011-11-19 NOTE — Progress Notes (Signed)
Subjective: Mr. Almanza is well knonw to me. He was seen Friday , May 31st for change in sensation of the left hand and to some degree the right. See ov note. He was stable, fully anticoagulated with a non-focal exam. He presented with clumsiness, speech trouble and weakness. Code stroke called and stopped. CT brain was unremarkable for any acute change. He is fully anticoagulated. He had normal Carotid doppler Jan '13 - no need to repeat.  He has a leukocytosis with slight left shift and increased monocytes, some polychromasia. He has a mild anemia.  He is awake and alert but is having trouble expressing himself. He admits to knowing what he wants to say but has trouble getting the words out. He has no focal complaints. He, and his wife, deny any fever, chills, or other signs of infection.   Objective: Lab: Lab Results  Component Value Date   WBC 17.7* 11/19/2011   HGB 10.8* 11/19/2011   HCT 32.2* 11/19/2011   MCV 95.3 11/19/2011   PLT 569* 11/19/2011   BMET    Component Value Date/Time   NA 143 11/19/2011 0525   NA 142 11/19/2010 1500   K 3.8 11/19/2011 0525   CL 107 11/19/2011 0525   CO2 24 11/19/2011 0525   GLUCOSE 83 11/19/2011 0525   BUN 19 11/19/2011 0525   BUN 26* 11/19/2010 1500   CREATININE 0.88 11/19/2011 0525   CREATININE 0.8 11/19/2010 1500   CALCIUM 9.2 11/19/2011 0525   GFRNONAA 78* 11/19/2011 0525   GFRAA >90 11/19/2011 0525     Imaging:   Physical Exam: Filed Vitals:   11/19/11 1000  BP: 138/79  Pulse: 66  Temp: 97.5 F (36.4 C)  Resp: 18   Pleasant elderly white man in no distress. HEENT- [place]-AT, C&S clear Neck- supple Nodes - no enlarged nodes neck Cor- 2+ radial pulse, IRIR, no JVD, no carotid bruits Pulm - normal respirations, lungs CTAP Abd - BS+ , no guarding or rebound Neuro - Alert, awake, oriented to place and examiner, speech is clear but he is having trouble expressing himself ( not anew problem according to his wife). CN II-XII normal facial symmetry, no deviation or  fasciculations of the tongue; MS normal grip strength, MAE; Cerebellar - no tremor.    Assessment/Plan: 1. Neuro - CVA with rapid resolution of symptoms except for expressive difficulties. CT normal, no indication for MRI - plus he has pacemaker, Carotid doppler normal in Jan '13, 2 D echo pending. He is fully anticoagulated on warfarin.  Plan  PT/OT and speech evaluation  2. Cardiac - stable a. Fib with good rate control  Plan - continue home medications  D/c telemetry  3. ID/leukocytosis - no evidence of infection on exam. U/A w/o WBCs or bacteria; CXR is normal.  Plan -  With no foci of infection will hold off on antibiotics  F/u lab in AM  4. Anemia - mild drop in Hgb with no evidence of active bleeding. Smear is abnormal  Plan - anemia panel  Pathologist to review blood smear  F/u lab in AM   Sailor Hevia 11/19/2011, 12:40 PM

## 2011-11-19 NOTE — Progress Notes (Signed)
  Echocardiogram 2D Echocardiogram has been performed.  Sandie Swayze L 11/19/2011, 10:25 AM

## 2011-11-20 ENCOUNTER — Inpatient Hospital Stay (HOSPITAL_COMMUNITY): Payer: Medicare Other

## 2011-11-20 ENCOUNTER — Observation Stay (HOSPITAL_COMMUNITY): Payer: Medicare Other

## 2011-11-20 DIAGNOSIS — G459 Transient cerebral ischemic attack, unspecified: Secondary | ICD-10-CM

## 2011-11-20 DIAGNOSIS — I619 Nontraumatic intracerebral hemorrhage, unspecified: Secondary | ICD-10-CM

## 2011-11-20 DIAGNOSIS — T45515A Adverse effect of anticoagulants, initial encounter: Secondary | ICD-10-CM

## 2011-11-20 DIAGNOSIS — D72829 Elevated white blood cell count, unspecified: Secondary | ICD-10-CM

## 2011-11-20 DIAGNOSIS — D473 Essential (hemorrhagic) thrombocythemia: Secondary | ICD-10-CM

## 2011-11-20 LAB — URINE CULTURE
Colony Count: NO GROWTH
Culture  Setup Time: 201306021128
Culture: NO GROWTH

## 2011-11-20 LAB — MRSA PCR SCREENING: MRSA by PCR: NEGATIVE

## 2011-11-20 LAB — CBC
HCT: 32.8 % — ABNORMAL LOW (ref 39.0–52.0)
Hemoglobin: 11.1 g/dL — ABNORMAL LOW (ref 13.0–17.0)
MCV: 94.3 fL (ref 78.0–100.0)
WBC: 26.5 10*3/uL — ABNORMAL HIGH (ref 4.0–10.5)

## 2011-11-20 LAB — BASIC METABOLIC PANEL
BUN: 15 mg/dL (ref 6–23)
CO2: 26 mEq/L (ref 19–32)
Chloride: 102 mEq/L (ref 96–112)
Glucose, Bld: 90 mg/dL (ref 70–99)
Potassium: 4 mEq/L (ref 3.5–5.1)

## 2011-11-20 LAB — PROTIME-INR
INR: 2.29 — ABNORMAL HIGH (ref 0.00–1.49)
INR: 2.53 — ABNORMAL HIGH (ref 0.00–1.49)

## 2011-11-20 LAB — ABO/RH: ABO/RH(D): B POS

## 2011-11-20 MED ORDER — ANTIINHIBITOR COAGULANT CMPLX IV SOLR
526.0000 [IU] | INTRAVENOUS | Status: DC
  Filled 2011-11-20: qty 526

## 2011-11-20 MED ORDER — CEFTRIAXONE SODIUM 2 G IJ SOLR
2.0000 g | Freq: Once | INTRAMUSCULAR | Status: AC
  Administered 2011-11-20: 2 g via INTRAVENOUS
  Filled 2011-11-20: qty 2

## 2011-11-20 MED ORDER — ANTIINHIBITOR COAGULANT CMPLX IV SOLR
500.0000 [IU] | INTRAVENOUS | Status: DC
  Filled 2011-11-20: qty 500

## 2011-11-20 MED ORDER — DEXTROSE 5 % IV SOLN
1.0000 g | INTRAVENOUS | Status: DC
  Administered 2011-11-21 – 2011-11-22 (×2): 1 g via INTRAVENOUS
  Filled 2011-11-20 (×2): qty 10

## 2011-11-20 MED ORDER — ANTIINHIBITOR COAGULANT CMPLX IV SOLR: 500.0000 [IU] | Freq: Once | INTRAVENOUS | Status: DC | PRN

## 2011-11-20 MED ORDER — VITAMIN K1 10 MG/ML IJ SOLN
10.0000 mg | INTRAVENOUS | Status: DC
  Filled 2011-11-20: qty 1

## 2011-11-20 NOTE — Evaluation (Signed)
Physical Therapy Evaluation Patient Details Name: [name] MRN: [id number] DOB: 1929/03/27 Today's Date: 11/20/2011 Time: 4132-4401 PT Time Calculation (min): 28 min  PT Assessment / Plan / Recommendation Clinical Impression  Pt is 78 y/o male admitted for left sided numbness and r/o TIA vs CVA.  Pt presenting with left inattention and reports sensation impairment on the left.  Difficulty assessing vision due to cognition and lethargy.  Will continue to assess.  Recommending inpatient rehab at this time.    PT Assessment  Patient needs continued PT services    Follow Up Recommendations  Inpatient Rehab    Barriers to Discharge        lEquipment Recommendations  Defer to next venue    Recommendations for Other Services Rehab consult   Frequency Min 4X/week    Precautions / Restrictions Precautions Precautions: Fall   Pertinent Vitals/Pain No c/o pain      Mobility  Bed Mobility Bed Mobility: Supine to Sit Supine to Sit: 3: Mod assist Details for Bed Mobility Assistance: (A) to elevate trunk OOB with cues for technique Transfers Transfers: Stand to Sit;Sit to Stand;Stand Pivot Transfers Sit to Stand: 1: +2 Total assist;From bed;From elevated surface Sit to Stand: Patient Percentage: 70% Stand to Sit: 1: +2 Total assist;To chair/3-in-1 Stand to Sit: Patient Percentage: 70% Stand Pivot Transfers: 1: +2 Total assist;From elevated surface Stand Pivot Transfers: Patient Percentage: 60% Details for Transfer Assistance: (A) to initiate transfer and slowly descend to recliner with max cues for technique.  Increase assistance needed with stand pivot tranfer due to extra time needed to advance LLE. Modified Rankin (Stroke Patients Only) Pre-Morbid Rankin Score: No symptoms Modified Rankin: Severe disability    Exercises     PT Diagnosis: Difficulty walking;Abnormality of gait;Generalized weakness  PT Problem List: Decreased balance;Decreased activity tolerance;Decreased  mobility;Decreased cognition;Decreased knowledge of use of DME;Decreased knowledge of precautions;Impaired sensation PT Treatment Interventions: DME instruction;Gait training;Stair training;Functional mobility training;Therapeutic activities;Therapeutic exercise;Balance training;Neuromuscular re-education;Cognitive remediation;Patient/family education   PT Goals Acute Rehab PT Goals PT Goal Formulation: With patient/family Time For Goal Achievement: 12/04/11 Potential to Achieve Goals: Good Pt will go Supine/Side to Sit: with modified independence PT Goal: Supine/Side to Sit - Progress: Goal set today Pt will go Sit to Stand: with supervision PT Goal: Sit to Stand - Progress: Goal set today Pt will go Stand to Sit: with supervision PT Goal: Stand to Sit - Progress: Goal set today Pt will Stand: with supervision;3 - 5 min PT Goal: Stand - Progress: Goal set today Pt will Ambulate: >150 feet;with min assist;with rolling walker PT Goal: Ambulate - Progress: Goal set today  Visit Information  Last PT Received On: 11/20/11 Assistance Needed: +2 PT/OT Co-Evaluation/Treatment: Yes    Subjective Data  Subjective: "I guess I'm at home." Patient Stated Goal: To return home   Prior Functioning  Home Living Lives With: Spouse Available Help at Discharge: Family Type of Home:  (condo) Home Access: Stairs to enter Secretary/administrator of Steps: 2 Entrance Stairs-Rails: Right Home Layout: Two level;Able to live on main level with bedroom/bathroom Bathroom Shower/Tub: Walk-in shower;Door Dentist: None Prior Function Level of Independence: Independent Able to Take Stairs?: Yes Driving: Yes Vocation: Retired    IT consultant  Overall Cognitive Status: Impaired Arousal/Alertness: Awake/alert (lethargic initially but increase alertness with mobility) Orientation Level: Disoriented to;Place;Time;Situation Behavior During Session:  Lethargic Cognition - Other Comments: Pt with increase lethargy at end of session making it difficult to complete vision assessment  Extremity/Trunk Assessment Right Lower Extremity Assessment RLE ROM/Strength/Tone: Unable to fully assess;Due to impaired cognition RLE Sensation: WFL - Light Touch RLE Coordination: WFL - gross motor Left Lower Extremity Assessment LLE ROM/Strength/Tone: Unable to fully assess LLE Sensation: Deficits LLE Sensation Deficits: Pt reported "It feels different."   Balance Balance Balance Assessed: Yes Static Sitting Balance Static Sitting - Balance Support: Feet supported Static Sitting - Level of Assistance: 5: Stand by assistance Static Standing Balance Static Standing - Balance Support: Bilateral upper extremity supported Static Standing - Level of Assistance: 1: +2 Total assist;Patient percentage (comment) (70%) Static Standing - Comment/# of Minutes: ~5 minutes prior to transfer to recliner  End of Session PT - End of Session Equipment Utilized During Treatment: Gait belt Activity Tolerance: Patient limited by fatigue Patient left: in chair;with call bell/phone within reach;with family/visitor present Nurse Communication: Mobility status   [name] 11/20/2011, 8:59 AM Jake Shark, PT DPT [phone]

## 2011-11-20 NOTE — Progress Notes (Signed)
Subjective: Per wife's report he has had a tough time. He has not eaten, he was restless and this AM he is very somnolent. He does awaken and denies any pain, cough, fever or other symptoms.. She has noted that he has a new left facial droop.  Objective: Lab: Lab Results  Component Value Date   WBC 26.5* 11/20/2011   HGB 11.1* 11/20/2011   HCT 32.8* 11/20/2011   MCV 94.3 11/20/2011   PLT 512* 11/20/2011   BMET    Component Value Date/Time   NA 141 11/20/2011 0600   NA 142 11/19/2010 1500   K 4.0 11/20/2011 0600   CL 102 11/20/2011 0600   CO2 26 11/20/2011 0600   GLUCOSE 90 11/20/2011 0600   BUN 15 11/20/2011 0600   BUN 26* 11/19/2010 1500   CREATININE 0.96 11/20/2011 0600   CREATININE 0.8 11/19/2010 1500   CALCIUM 9.3 11/20/2011 0600   GFRNONAA 76* 11/20/2011 0600   GFRAA 88* 11/20/2011 0600    Iron 39 (L), % Sat 16 (L), Ferritin 1006, Folate 17, B12 585  Imaging: 2D echo: Study Conclusions  - Left ventricle: The cavity size was normal. Systolic function was mildly to moderately reduced. The estimated ejection fraction was in the range of 40% to 45%. Doppler parameters are consistent with abnormal left ventricular relaxation (grade 1 diastolic dysfunction). - Ventricular septum: Septal motion showed abnormal function and dyssynergy. - Mitral valve: Mildly calcified annulus. Mild to moderate regurgitation. - Left atrium: The atrium was severely dilated. - Right ventricle: Systolic pressure was increased. - Right atrium: The atrium was moderately dilated. - Tricuspid valve: Severe regurgitation. - Pulmonary arteries: PA peak pressure: 43mm Hg (S). - Pericardium, extracardiac: A trivial pericardial effusion was identified posterior to the heart. Impressions:  - The right ventricular systolic pressure was increased consistent with moderate pulmonary hypertension.  No significant change from June '12 except EF was 55%    Physical Exam: Filed Vitals:   11/20/11 0600  BP: 134/76  Pulse: 76    Temp: 97.5 F (36.4 C)  Resp: 18  No intake or output data in the 24 hours ending 11/20/11 0818 Gen'l- Elderly white man who is somnolent but arousable, in no distrss HEENT - C&S clear Cor- 2+ radial pulse, IRIR rate controlled, no Carotid bruit Pulm - normal respirations, no rles or wheezes, good sats Abd- BS+, soft, no guarding or rebound Neuro - somnolent, speech is clear, cognitive function not tested. CN - new left facial droop and loss of naso-labial fold. MAE     Assessment/Plan: 1. Neuro - evolution of symptoms raises question of bleed. Plan - CT brain w/o contrast STAT  PT/OT eval  2. Cardiac - hemodynamically stable.  3. ID/leukocytosis - no evidence of infection but WBC rising. Plan- Rocephin 2 g stat and 1 g q 24  Hematology consult - concern for leukemia  4. Anemia - mild iron deficiency Plan path review of smear pending.  Hematology consult  Code status - discussed with the wife and son. They feel he would not want CPR or mechanical ventilation - will make a limited code.    Illene Regulus 11/20/2011, 7:44 AM

## 2011-11-20 NOTE — Progress Notes (Signed)
PM note - reviewed events: plan modified since discussion with Dr. Pearlean Brownie - holding REIBA until repeat CT brain to look for extension of hemorrhage.   Appreciated Dr. Asa Lente assistance in evaluation leukocytosis - possibly inflammatory response. Patient has received first dose of rocephin.  Filed Vitals:   11/20/11 1700  BP: 126/76  Pulse: 66  Temp:   Resp: 19   Gen'l - awake and responsive.  Neuro - Moves extremities to command including left; decrease left facial droop but persistent flattening of naso-labial fold. Cor -rrr  A/P 1. Neuro - patient has shown improvement over the course of the day.  Plan - for CT this PM to evaluate for extension with plan to use REIBA if there is enlarged hemorrhage  3. Leukocytosis - patient has received antibiotics although no foci of infection noted. Additional labs pending.

## 2011-11-20 NOTE — Evaluation (Signed)
Occupational Therapy Evaluation Patient Details Name: [name] MRN: [id number] DOB: [birth year] Today's Date: 11/20/2011 Time: 8469-6295 OT Time Calculation (min): 30 min  OT Assessment / Plan / Recommendation Clinical Impression  Pt admitted with left-sided weakness and r/o TIA vs CVA. Pt presents with left- sided inattention, visual deficits, right/left discrimination deficit, lethargy and cognitive deficits. Pt will benefit from skilled OT in the acute setting followed by CIR to maximize I with ADL and ADL mobility prior to d/c    OT Assessment  Patient needs continued OT Services    Follow Up Recommendations  Inpatient Rehab    Barriers to Discharge      Equipment Recommendations  Defer to next venue    Recommendations for Other Services Rehab consult  Frequency  Min 2X/week    Precautions / Restrictions Precautions Precautions: Fall   Pertinent Vitals/Pain Pt indicated some right knee pain with knee flexion at EOB, but did not indicated pain persisted. Repositioned    ADL  Lower Body Dressing: Performed;+1 Total assistance Where Assessed - Lower Body Dressing: Sopported sit to stand Toilet Transfer: Simulated;+2 Total assistance Toilet Transfer: Patient Percentage: 60% Toilet Transfer Method: Sit to stand Toileting - Clothing Manipulation and Hygiene: Simulated;+1 Total assistance Where Assessed - Toileting Clothing Manipulation and Hygiene: Standing ADL Comments: Pt lethargic therefore difficult to fully test sensation, vision and fine motor skills. However, pt demonstrates deficits related to processing/attention and possibly vision. Unsure of fine motor and sensation    OT Diagnosis: Generalized weakness;Cognitive deficits;Disturbance of vision  OT Problem List: Decreased activity tolerance;Impaired balance (sitting and/or standing);Impaired vision/perception;Decreased coordination;Decreased cognition;Decreased safety awareness;Decreased knowledge of use of DME or  AE;Decreased knowledge of precautions;Impaired sensation;Impaired UE functional use OT Treatment Interventions: Self-care/ADL training;Therapeutic activities;Visual/perceptual remediation/compensation;Cognitive remediation/compensation;Patient/family education;Balance training;DME and/or AE instruction;Neuromuscular education   OT Goals Acute Rehab OT Goals OT Goal Formulation: With patient/family Time For Goal Achievement: 12/04/11 Potential to Achieve Goals: Good ADL Goals Pt Will Perform Grooming: with supervision;with set-up;with cueing (comment type and amount);Standing at sink (Min VC) ADL Goal: Grooming - Progress: Goal set today Pt Will Perform Upper Body Dressing: with supervision;with set-up;Sitting, chair;Sitting, bed;with cueing (comment type and amount) (Min cueing) ADL Goal: Upper Body Dressing - Progress: Goal set today Pt Will Transfer to Toilet: with min assist;Stand pivot transfer;Ambulation;with DME;3-in-1 ADL Goal: Toilet Transfer - Progress: Goal set today Pt Will Perform Toileting - Hygiene: with supervision;with set-up;Sitting on 3-in-1 or toilet;Sit to stand from 3-in-1/toilet ADL Goal: Toileting - Hygiene - Progress: Goal set today Additional ADL Goal #1: Pt will locate 3/3 ADL objects located in his left visual field with Min VC's.  ADL Goal: Additional Goal #1 - Progress: Goal set today Additional ADL Goal #2: Pt will utilize LUE in bilateral UE ADL tasks with Min VC's ADL Goal: Additional Goal #2 - Progress: Goal set today  Visit Information  Last OT Received On: 11/20/11 Assistance Needed: +2 PT/OT Co-Evaluation/Treatment: Yes    Subjective Data  Subjective: I wear glasses when I read Patient Stated Goal: Return to walking around neighborhood   Prior Functioning  Home Living Lives With: Spouse Available Help at Discharge: Family Type of Home:  (condo) Home Access: Stairs to enter Secretary/administrator of Steps: 2 Entrance Stairs-Rails: Right Home  Layout: Two level;Able to live on main level with bedroom/bathroom Bathroom Shower/Tub: Walk-in shower;Door Dentist: None Prior Function Level of Independence: Independent Able to Take Stairs?: Yes Driving: Yes Vocation: Retired Musician: No difficulties Dominant Hand:  Right    Cognition  Overall Cognitive Status: Impaired Arousal/Alertness: Awake/alert (lethargic initially but increase alertness with mobility) Orientation Level: Disoriented to;Place;Time;Situation Behavior During Session: Lethargic Cognition - Other Comments: Pt with increase lethargy at end of session making it difficult to complete vision assessment    Extremity/Trunk Assessment Right Upper Extremity Assessment RUE ROM/Strength/Tone: Within functional levels RUE Sensation: WFL - Light Touch RUE Coordination: WFL - gross/fine motor Left Upper Extremity Assessment LUE ROM/Strength/Tone: WFL for tasks assessed (question grip strength?) LUE Sensation:  (unable to fully assess) LUE Coordination:  (unable to fully assess) Right Lower Extremity Assessment RLE ROM/Strength/Tone: Unable to fully assess;Due to impaired cognition RLE Sensation: WFL - Light Touch RLE Coordination: WFL - gross motor Left Lower Extremity Assessment LLE ROM/Strength/Tone: Unable to fully assess LLE Sensation: Deficits LLE Sensation Deficits: Pt reported "It feels different."   Mobility Bed Mobility Bed Mobility: Supine to Sit Supine to Sit: 3: Mod assist Details for Bed Mobility Assistance: (A) to elevate trunk OOB with cues for technique Transfers Sit to Stand: 1: +2 Total assist;From bed;From elevated surface Sit to Stand: Patient Percentage: 70% Stand to Sit: 1: +2 Total assist;To chair/3-in-1 Stand to Sit: Patient Percentage: 70% Details for Transfer Assistance: (A) to initiate transfer and slowly descend to recliner with max cues for technique.  Increase assistance  needed with stand pivot tranfer due to extra time needed to advance LLE.   Exercise    Balance Balance Balance Assessed: Yes Static Sitting Balance Static Sitting - Balance Support: Feet supported Static Sitting - Level of Assistance: 5: Stand by assistance Static Standing Balance Static Standing - Balance Support: Bilateral upper extremity supported Static Standing - Level of Assistance: 1: +2 Total assist;Patient percentage (comment) (70%) Static Standing - Comment/# of Minutes: ~5 minutes prior to transfer to recliner  End of Session OT - End of Session Equipment Utilized During Treatment: Gait belt Activity Tolerance: Patient limited by fatigue Patient left: in chair;with call bell/phone within reach;with family/visitor present Nurse Communication: Mobility status   Deserai Cansler 11/20/2011, 9:52 AM

## 2011-11-20 NOTE — Progress Notes (Signed)
Utilization review complete 

## 2011-11-20 NOTE — Plan of Care (Signed)
Problem: Phase II Progression Outcomes Goal: Tolerating diet Outcome: Progressing [place] Sanon B. Nazia Rhines, MSP, CCC-SLP [phone]        

## 2011-11-20 NOTE — Discharge Instructions (Signed)
STROKE/TIA DISCHARGE INSTRUCTIONS SMOKING Cigarette smoking nearly doubles your risk of having a stroke & is the single most alterable risk factor  If you smoke or have smoked in the last 12 months, you are advised to quit smoking for your health.  Most of the excess cardiovascular risk related to smoking disappears within a year of stopping.  Ask you doctor about anti-smoking medications  [facility] Quit Line: 1-800-QUIT NOW  Free Smoking Cessation Classes [phone]  CHOLESTEROL Know your levels; limit fat & cholesterol in your diet  Lipid Panel     Component Value Date/Time   CHOL 116 11/19/2011 0525   TRIG 58 11/19/2011 0525   HDL 47 11/19/2011 0525   CHOLHDL 2.5 11/19/2011 0525   VLDL 12 11/19/2011 0525   LDLCALC 57 11/19/2011 0525      Many patients benefit from treatment even if their cholesterol is at goal.  Goal: Total Cholesterol (CHOL) less than 160  Goal:  Triglycerides (TRIG) less than 150  Goal:  HDL greater than 40  Goal:  LDL (LDLCALC) less than 100   BLOOD PRESSURE American Stroke Association blood pressure target is less that 120/80 mm/Hg  Your discharge blood pressure is:  BP: 134/76 mmHg  Monitor your blood pressure  Limit your salt and alcohol intake  Many individuals will require more than one medication for high blood pressure  DIABETES (A1c is a blood sugar average for last 3 months) Goal HGBA1c is under 7% (HBGA1c is blood sugar average for last 3 months)  Diabetes: {STROKE DC DIABETES:22357}    Lab Results  Component Value Date   HGBA1C 5.7* 11/18/2011     Your HGBA1c can be lowered with medications, healthy diet, and exercise.  Check your blood sugar as directed by your physician  Call your physician if you experience unexplained or low blood sugars.  PHYSICAL ACTIVITY/REHABILITATION Goal is 30 minutes at least 4 days per week    {STROKE DC ACTIVITY/REHAB:22359}  Activity decreases your risk of heart attack and stroke and makes your heart stronger.  It  helps control your weight and blood pressure; helps you relax and can improve your mood.  Participate in a regular exercise program.  Talk with your doctor about the best form of exercise for you (dancing, walking, swimming, cycling).  DIET/WEIGHT Goal is to maintain a healthy weight  Your discharge diet is: Cardiac *** liquids Your height is:  Height: 5' 5.5" (166.4 cm) Your current weight is: Weight: 84.6 kg (186 lb 8.2 oz) Your Body Mass Index (BMI) is:  BMI (Calculated): 30.6   Following the type of diet specifically designed for you will help prevent another stroke.  Your goal weight range is:  ***  Your goal Body Mass Index (BMI) is 19-24.  Healthy food habits can help reduce 3 risk factors for stroke:  High cholesterol, hypertension, and excess weight.  RESOURCES Stroke/Support Group:  Call [phone]  they meet the 3rd Sunday of the month on the Rehab Unit at [facility], [place] ( no meetings June, July & Aug).  STROKE EDUCATION PROVIDED/REVIEWED AND GIVEN TO PATIENT Stroke warning signs and symptoms How to activate emergency medical system (call 911). Medications prescribed at discharge. Need for follow-up after discharge. Personal risk factors for stroke. Pneumonia vaccine given:   {STROKE DC YES/NO/DATE:22363} Flu vaccine given:   {STROKE DC YES/NO/DATE:22363} My questions have been answered, the writing is legible, and I understand these instructions.  I will adhere to these goals & educational materials that have  been provided to me after my discharge from the hospital.

## 2011-11-20 NOTE — Progress Notes (Signed)
Stroke Team Progress Note  HISTORY DEMICHAEL TRAUM is an 78 y.o. male history of atrial fibrillation on Coumadin, cardiac pacemaker, previous TIA and dementia, presenting with acute onset of visual difficulty on the left side as well as weakness and numbness involving his left extremities starting at 5:30 PM 11/18/2011. INR was 3.02. CT scan of his head showed no acute intracranial abnormality. NIH stroke score was 4. Patient's visual difficulty on the left began to resolve while in the emergency room. Repeat NIH score was 3. Patient was not a candidate for thrombolytic therapy because of his therapeutic INR. He was admitted for further evaluation and treatment.  SUBJECTIVE His wife is at the bedside.  Overall he feels his condition is gradually worsening. He is more confused per wife than usual.his vision deficits persist. He has not had any previous history of strokes or TIAs but has significant dementia at baseline.  OBJECTIVE Most recent Vital Signs: Filed Vitals:   11/19/11 1800 11/19/11 2200 11/20/11 0600 11/20/11 1000  BP: 140/82 141/75 134/76 124/70  Pulse: 65 90 76 71  Temp: 98 F (36.7 C) 98.5 F (36.9 C) 97.5 F (36.4 C) 98.8 F (37.1 C)  TempSrc: Oral   Oral  Resp: 18 18 18 18   Height:      Weight:      SpO2: 98% 92% 94% 96%   CBG (last 3)   Basename 11/18/11 2258  GLUCAP 103*   Intake/Output from previous day:   IV Fluid Intake:     MEDICATIONS    . atenolol  50 mg Oral Daily  . cefTRIAXone (ROCEPHIN)  IV  1 g Intravenous Q24H  . cefTRIAXone (ROCEPHIN)  IV  2 g Intravenous Once  . donepezil  10 mg Oral QHS  . memantine  10 mg Oral BID  . warfarin  5 mg Oral ONCE-1800  . DISCONTD: Warfarin - Pharmacist Dosing Inpatient   Does not apply q1800   PRN:    Diet:  Cardiac thin liquids Activity:  Ambulate with assistance,  Bathroom privileges DVT Prophylaxis:  Coumadin, INR therapeutic  CLINICALLY SIGNIFICANT STUDIES Basic Metabolic Panel:  Lab 11/20/11 1610  11/19/11 0525  NA 141 143  K 4.0 3.8  CL 102 107  CO2 26 24  GLUCOSE 90 83  BUN 15 19  CREATININE 0.96 0.88  CALCIUM 9.3 9.2  MG -- --  PHOS -- --   Liver Function Tests:  Lab 11/19/11 0525 11/18/11 1822  AST 23 26  ALT 13 14  ALKPHOS 59 65  BILITOT 1.7* 1.5*  PROT 6.0 6.8  ALBUMIN 3.8 4.1   CBC:  Lab 11/20/11 0600 11/19/11 0525 11/18/11 1822  WBC 26.5* 17.7* --  NEUTROABS -- 13.7* 13.5*  HGB 11.1* 10.8* --  HCT 32.8* 32.2* --  MCV 94.3 95.3 --  PLT 512* 569* --   Coagulation:  Lab 11/20/11 0600 11/19/11 0525 11/18/11 1822 11/14/11 1016  LABPROT 25.6* 30.5* 31.8* --  INR 2.29* 2.87* 3.02* 2.9   Cardiac Enzymes:  Lab 11/18/11 1822  CKTOTAL 80  CKMB 2.8  CKMBINDEX --  TROPONINI <0.30   Urinalysis:  Lab 11/18/11 2131  COLORURINE AMBER*  LABSPEC 1.025  PHURINE 5.0  GLUCOSEU NEGATIVE  HGBUR NEGATIVE  BILIRUBINUR SMALL*  KETONESUR 15*  PROTEINUR NEGATIVE  UROBILINOGEN 1.0  NITRITE NEGATIVE  LEUKOCYTESUR TRACE*   Lipid Panel    Component Value Date/Time   CHOL 116 11/19/2011 0525   TRIG 58 11/19/2011 0525   HDL 47 11/19/2011 0525  CHOLHDL 2.5 11/19/2011 0525   VLDL 12 11/19/2011 0525   LDLCALC 57 11/19/2011 0525   HgbA1C  Lab Results  Component Value Date   HGBA1C 5.7* 11/18/2011    Urine Drug Screen:     Component Value Date/Time   LABOPIA NONE DETECTED 11/18/2011 2155   COCAINSCRNUR NONE DETECTED 11/18/2011 2155   LABBENZ NONE DETECTED 11/18/2011 2155   AMPHETMU NONE DETECTED 11/18/2011 2155   THCU NONE DETECTED 11/18/2011 2155   LABBARB NONE DETECTED 11/18/2011 2155    Thyroid Function Tests:  Lab 11/19/11 0525  TSH 4.260  T4TOTAL --  FREET4 --  T3FREE --  THYROIDAB --   Anemia Panel:  Lab 11/19/11 1340  VITAMINB12 585  FOLATE 17.0  FERRITIN 1006*  TIBC 242  IRON 39*  RETICCTPCT 3.2*   CT of the brain  11/20/2011   New 7 x 10 mm hemorrhagic focus right parietal region with mild surrounding edema was not present previously.  Findings likely represent  a hemorrhagic infarct related to an embolus.  Suspected acute non hemorrhagic right occipital infarct not visualized previously.  Correlate with history of visual difficulty.  Critical Value/emergent results were called by telephone at the time of interpretation on 11/20/2011  at 9:35 a.m.  to  Dr. Debby Bud, who verbally acknowledged these results.  11/18/2011  No acute intracranial abnormality.  MRI of the brain  pacer  MRA of the brain  pacer  2D Echocardiogram  EF 40-45% with no source of embolus. consistent with moderate pulmonary hypertension.  Carotid Doppler  07/09/2011 No internal carotid artery stenosis bilaterally. Vertebrals with antegrade flow bilaterally.   CXR  11/18/2011  Shallow inspiration.  No evidence of active pulmonary disease.    EKG  Ventricular paced rhythm.   Therapy Recommendations PT -CIR, OT -CIR  Physical Exam pleasant elderly Caucasian male currently not in distressAwake alert. Afebrile. Head is nontraumatic. Neck is supple without bruit. Hearing is normal. Cardiac exam no murmur or gallop. Lungs are clear to auscultation. Distal pulses are well felt.  Neurological Exam : awake alert disoriented x3. Speech and language appear normal. Diminished attention, short-term memory, registration and recall. Follows only a few simple one and occasional two-step commands. Extraocular moments are full range without nystagmus. Does not blink to threat on the left. Not cooperative for detailed visual field testing exam. Visual acuity. and fundi could not be adequately tested. There is minimum left lower facial symmetry. Tongue is midline. Motor system exam is limited by patient's cooperation but able to move all her activities against gravity without focal weakness. Sensation and cognition appears intact. Gait was not tested. ASSESSMENT Mr. CARDELL RACHEL is a 78 y.o. male with an ischemic infarct on arrival in setting of therapeutic INR, now with new 7 x 10 mm hemorrhagic focus right  parietal region with mild surrounding edema. Hemorrhagic transformation likely secondary to therapeutic INR vs amyloid angiopathy. On warfarin prior to admission, INR 3.03. Now on warfarin for secondary stroke prevention, INR 2.96. Patient with resultant increased confusion, left hemiparesis.  -baseline dementia -pacemaker -atrial fibrillation, on chronic coumadin -hypertension -TIA 06/2011  Hospital day # 2  TREATMENT/PLAN -stop warfarin for secondary stroke prevention do to hemorrhagic transformation. Close neurological followup and repeat  CT scan and INR in 12 hours. If there is neurological worsening or increase hemorrhage size may need to reverse with anticoagulation reversal protocol. -transfer to the ICU -check CT at 9p tonight & INR -if CT shows increasing hemorrhage tonight, adiminister FEIBA protocol for  reversal of INR   -NPO. SLP to check swallow given neuro worsening -change patient status to Inpatient for transfer -bedrest today. Likely ok for OOB tomorrow -discussed with Dr.Norins, patient and family members and answered questions. This patient is critically ill and at significant risk of neurological worsening, death and care requires constant monitoring of vital signs, hemodynamics,respiratory and cardiac monitoring,review of multiple databases, neurological assessment, discussion with family, other specialists and medical decision making of high complexity. I spent 30 minutes of neurocritical care time  in the care of  this patient.  Dr. Pearlean Brownie discussed with Dr. Debby Bud, who is agreeable to transfer. Orders placed.  Joaquin Music, ANP-BC, GNP-BC [facility] Pager: [phone] 11/20/2011 10:52 AM  Scribe for Dr. Delia Heady, Stroke Center Medical Director. He has personally reviewed chart, pertinent data, examined the patient and developed the plan of care. Pager:  [phone]

## 2011-11-20 NOTE — Progress Notes (Signed)
Called ICU and gave report to the nurse Renae Fickle. Started a new IV and took Mr.[name]'s to ICU in bed. Passed FIBA-NF and all other meds to the ICU nurse, also informed FFP is ready in blood bank. Pt is steady without any other new signs and symptoms except slight facial palsy.

## 2011-11-20 NOTE — Consult Note (Signed)
[facility] CONSULTATION NOTE  Reason for Consult: Leukocytosis Primary MD: Dr. Debby Bud  ZOX:WRUEAV ERIVERTO BYRNES is a 78 y.o.  white male with multiple medical problems listed below including prostate Ca with completion of 41 radiation treatments in 02/2010, admitted on 6/1 with AMS, L hand numbness and L facial droop. This was preceded by 2 day history of decreased B upper extremity sensation. CT head was remarkable for ischemic infarct on arrival, as per Stroke team.WBC 18.8 on 6/1, with ANC of 13.5 .   His H/H was 11.2/33, with platelets elevated at 568 k. On 6/2 his WBC was 17.7 with ANC of 13.7, plts 569. UA showed small bili, trace leuko.UTI workup in progress. Rocephin IV initiated 6/2. Marland KitchenToday, his WBC is 26.5 , with ANC N/A.  plts 512k. As of January of 2013, his WBC was 12.8 with ANC of 10.1 and platelets of 758. He had discontinue the use of ASA , but was on Coumadin week earlier per chart.These abnormal values are new compared to prior CBCs dating back to 04/2007, when ANC was 4.1, with WBC of 6.4. Platelets then were 207k. Patient han a new onset of events today, with more agitation, confusion and intermittent periods of lethargy along with L facial droop. A new CT head without contrast 6/3 brain indicates a new 7 x 10 mm hemorrhagic focus right parietal region with mild surrounding edema was not present previously. Findings likely represent a hemorrhagic infarct related to an embolus. Suspected acute non hemorrhagic right occipital infarct not visualized previously. 2D echo 6/2  Shows EF 40-45% with no source of embolus, and moderate pulmonary hypertension seen. We were requested to see patient to rule out hematological abnormality. Smear has been order for review.      PMH: Past Medical History  Diagnosis Date  . Malignant neoplasm of prostate, with completion of 41 radiation treatments in 02/2010 (Dr. Annabell Howells   . Unspecified psychosis   . Cardiac pacemaker [employer]     . Atrial fibrillation on Chronic coumadin   . Pain in limb   . Encounter for long-term (current) use of other medications   . Unspecified essential hypertension   . BPH (benign prostatic hypertrophy)   . Other and unspecified hyperlipidemia   . Other acquired absence of organ   . Unspecified hemorrhoids without mention of complication   . Diverticulosis of colon (without mention of hemorrhage)   . History of vein stripping   . Thrombocytosis 07/09/2011  . Normocytic anemia 07/09/2011  . TIA (transient ischemic attack) 07/09/2011  . Benign neoplasm of colon     Surgeries:  1. 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  2. Existing pacemaker explanted May 07, 2007 with implantation of  Guidant McLoud I, plus SR model 1194 single-chamber pacemaker by  Dr. Sherryl Manges. 3.Tonsillectomy and adenoidectomy 4.Vein stripping laser    Allergies: No Known Allergies  Medications:  Prior to Admission:  Prescriptions prior to admission  Medication Sig Dispense Refill  . atenolol (TENORMIN) 50 MG tablet Take 50 mg by mouth daily.      Marland Kitchen donepezil (ARICEPT) 10 MG tablet Take 10 mg by mouth at bedtime.      . memantine (NAMENDA) 10 MG tablet Take 10 mg by mouth 2 (two) times daily.      Marland Kitchen warfarin (COUMADIN) 5 MG tablet Take 5 mg by mouth every evening.          ROS:per  wife report Constitutional: Positive for weight loss unknown amount. Negative for fever, chills and acute lethargy as mentioned above Respiratory: Negative for cough, hemoptysis and shortness of breath.  Cardiovascular: Negative for chest pain. GI: No nausea, vomiting, diarrhea, constipation. No change in bowel caliber. No  Melena or hematochezia.  GU: No blood in urine. No loss of urinary control. Skin: Negative for itching. No rash. No petechia. No bruising Neurological:L eye decreased vision. L sided weakness on admission. L facial droop.  Acute  Cognitive impairment as above. Of note, patient did have an episode of TIA on 11/22/2010 with L hand and arm weakness.   Family History:  Father died at 61 old age. Mother died heart failure. One sister recently diagnosed with leukemia (?type, requiring therapy in [place]) One sister died with possible sepsis following surgery.   Social History:  reports that he has quit smoking. His smoking use included Cigarettes. He has a 4 pack-year smoking history. He does not have any smokeless tobacco history on file. He reports that he drinks about 1.5 ounces of alcohol per week. He reports that he does not use illicit drugs.[facility]  1943 . He is married since 1946. 2 sons, one is 18, Barrister's clerk and one 47 (HVAC ). 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 Until January of 2013, he was ambulating 4 miles a day.     Physical Exam  78 year old  in no acute distress, confused. Lethargic. General well-developed and well-nourished  HEENT: Normocephalic, atraumatic, PERRLA. Oral cavity without thrush or lesions. Neck supple. no thyromegaly, no cervical or supraclavicular adenopathy  Lungs clear bilaterally . No wheezing, rhonchi or rales. No axillary masses. Breasts: not examined. Cardiac irregular rate and rhythm normal S1-S2, 2/6 SM rubs or gallops Abdomen soft nontender , bowel sounds x4. No HSM GU/rectal: deferred. Extremities no clubbing cyanosis or edema. No bruising or petechial rash Neuro, as per Stroke team. Patient lethargic, unable to follow commands at this time, falls back asleep. Confused, in the setting of dementia. Appears to move all extremities. L facial droop. Unable to test strength. Reacts to touch.     Labs:  CBC   Lab 11/20/11 0600 11/19/11 0525 11/18/11 1834 11/18/11 1822  WBC 26.5* 17.7* -- 18.8*  HGB 11.1* 10.8* 12.2* 11.2*  HCT 32.8* 32.2* 36.0* 33.0*  PLT 512* 569* -- 568*  MCV 94.3 95.3 -- 95.1  MCH 31.9 32.0 -- 32.3  MCHC  33.8 33.5 -- 33.9  RDW 25.7* 25.8* -- 25.8*  LYMPHSABS -- 0.9 -- 3.6  MONOABS -- 2.5* -- 1.5*  EOSABS -- 0.4 -- 0.2  BASOSABS -- 0.2* -- 0.0  BANDABS -- -- -- --     Anemia panel:   Basename 11/19/11 1340  VITAMINB12 585  FOLATE 17.0  FERRITIN 1006*  TIBC 242  IRON 39*  RETICCTPCT 3.2*     Basename 11/19/11 0525  TSH 4.260  T4TOTAL --  T3FREE --  THYROIDAB --        Component Value Date/Time   ESRSEDRATE 3 07/09/2011 0935      CMP    Lab 11/20/11 0600 11/19/11 0525 11/18/11 1834 11/18/11 1822  NA 141 143 144 138  K 4.0 3.8 4.1 4.1  CL 102 107 106 105  CO2 26 24 -- 22  GLUCOSE 90 83 103* 105*  BUN 15 19 24* 24*  CREATININE 0.96 0.88 1.20 1.10  CALCIUM 9.3 9.2 -- 9.3  MG -- -- -- --  AST -- 23 -- 26  ALT -- 13 -- 14  ALKPHOS -- 59 -- 65  BILITOT -- 1.7* -- 1.5*        Component Value Date/Time   BILITOT 1.7* 11/19/2011 0525   BILIDIR 0.0 12/15/2008 1002       Lab 11/20/11 0600 11/19/11 0525 11/18/11 1822 11/14/11 1016  INR 2.29* 2.87* 3.02* 2.9  PROTIME -- -- -- --    No results found for this basename: DDIMER:2 in the last 72 hours    Imaging Studies:  Ct Head Wo Contrast  11/20/2011  *RADIOLOGY REPORT*  Clinical Data: Progressive neurologic changes with new left facial weakness in a patient fully anticoagulated. History of atrial fibrillation.  CT HEAD WITHOUT CONTRAST  Technique:  Contiguous axial images were obtained from the base of the skull through the vertex without contrast.  Comparison: 11/18/2011  Findings: There is a new 7 x 10 mm right parietal vertex hemorrhage with mild surrounding edema not present previously.  This likely represents an embolic infarction from the patient's atrial fibrillation.  No other hemorrhages are seen.  There is asymmetric hypodensity (image 16 of series 2) of right occipital lobe which could represent a second infarction, non hemorrhagic, versus partial volume averaging of a sulcus.   There is no  hydrocephalus or extra-axial fluid.  Moderate atrophy is present with chronic microvascular ischemic change.  There are no CT signs of proximal vascular thrombosis.  Calvarium is intact. Clear sinuses and mastoids.  IMPRESSION: New 7 x 10 mm hemorrhagic focus right parietal region with mild surrounding edema was not present previously.  Findings likely represent a hemorrhagic infarct related to an embolus.  Suspected acute non hemorrhagic right occipital infarct not visualized previously.  Correlate with history of visual difficulty.  Critical Value/emergent results were called by telephone at the time of interpretation on 11/20/2011  at 9:35 a.m.  to  Dr. Debby Bud, who verbally acknowledged these results.  Original Report Authenticated By: Elsie Stain, M.D.   Ct Head Wo Contrast  11/18/2011  *RADIOLOGY REPORT*  Clinical Data: Altered mental status.  Left-sided weakness.  CT HEAD WITHOUT CONTRAST  Technique:  Contiguous axial images were obtained from the base of the skull through the vertex without contrast.  Comparison: 07/09/2011  Findings: There is atrophy and chronic small vessel disease changes.  No acute intracranial abnormality.  Specifically, no hemorrhage, hydrocephalus, mass lesion, acute infarction, or significant intracranial injury.  No acute calvarial abnormality.  IMPRESSION: No acute intracranial abnormality.  Critical Value/emergent results were called by telephone at the time of interpretation on 11/18/2011  at 6:38 p.m.  to  the Code stroke neurologist, who verbally acknowledged these results.  Original Report Authenticated By: Cyndie Chime, M.D.   Dg Chest Port 1 View  11/18/2011  *RADIOLOGY REPORT*  Clinical Data: Altered mental status.  Left-sided visual difficulty, weakness and numbness.  PORTABLE CHEST - 1 VIEW  Comparison: 07/09/2011  Findings: Stable appearance of cardiac pacemaker.  Shallow inspiration.  Borderline heart size and pulmonary vascularity are likely normal for  technique.  The no focal airspace consolidation in the lungs.  No blunting of costophrenic angles.  No pneumothorax.  IMPRESSION: Shallow inspiration.  No evidence of active pulmonary disease.  Original Report Authenticated By: Marlon Pel, M.D.        A/P: 78 y.o. male asked to see for evaluation of leukocytosis, likely multifactorial, in the setting of acute illness, including a new hemorrhagic infarct related to an embolus. Patient  has thrombocytosis as well. Smear has been ordered for review. Will evaluate the smear when available to rule out hematological abnormalities.  Dr. Arbutus Ped    is to see the patient following this consult with recommendations regarding diagnosis and further workup studies.  Thank you for the referral.  Marcos Eke 11/20/2011 10:11 AM  Hematology/oncology attending: Mr. thunder is seen and examined today. I agree with the above note. The patient has multiple medical problems. He was admitted to [facility] with mental status changes, left hand numbness and left facial droop. CT scan of the brain was remarkable for ischemic infarct. He was noted on blood work to have persistent leukocytosis secondary to UTI or other infectious etiology. This is most likely reactive in nature but I cannot rule out myeloproliferative disorder at this point. I ordered several studies to rule out myeloproliferative disorder Including LDH, leukocyte alkaline phosphatase and a molecular study for BCR/ABL.  Thank you so much for allowing me to participate in the care of Mr. Kastens. I will follow up the patient with you once these lab results becomes available. Please call if you have any questions.

## 2011-11-20 NOTE — Evaluation (Signed)
Bedside Swallow Evaluation  Patient Details  Name: LOUISE VICTORY MRN: [id number] Date of Birth: 1928/06/28  Today's Date: 11/20/2011 Time: 1135-1150 SLP Time Calculation (min): 15 min  Past Medical History:  Past Medical History  Diagnosis Date  . Malignant neoplasm of prostate   . Unspecified psychosis   . Cardiac pacemaker [employer]   . Atrial fibrillation   . Pain in limb   . Encounter for long-term (current) use of other medications   . Unspecified essential hypertension   . BPH (benign prostatic hypertrophy)   . Other and unspecified hyperlipidemia   . Other acquired absence of organ   . Unspecified hemorrhoids without mention of complication   . Diverticulosis of colon (without mention of hemorrhage)   . History of vein stripping   . Thrombocytosis 07/09/2011  . Normocytic anemia 07/09/2011  . TIA (transient ischemic attack) 07/09/2011  . Benign neoplasm of colon    Past Surgical History:  Past Surgical History  Procedure Date  . Pacemaker placement 1999, replaced 2008    hx tachy-brady  . Vein stripping laser   . Tonsillectomy and adenoidectomy    HPI:      Assessment / Plan / Recommendation Clinical Impression  No overt s/s aspiration observed or reported.  Recommend mechanical soft diet with chopped meats for energy conservation.  Thin liquids ok.    Aspiration Risk  Mild    Diet Recommendation Dysphagia 3 (Mechanical Soft);Thin liquid (chop meats)   Liquid Administration via: Cup;Straw Medication Administration: Whole meds with liquid Supervision: Patient able to self feed Compensations: Slow rate;Small sips/bites Postural Changes and/or Swallow Maneuvers: Seated upright 90 degrees    Other  Recommendations Oral Care Recommendations: Oral care BID      Frequency and Duration min 1 x/week  1 week   Pertinent Vitals/Pain No pain reported    SLP Swallow Goals Patient will consume recommended diet without observed clinical signs of aspiration  with: Modified independent assistance Swallow Study Goal #1 - Progress: Progressing toward goal Patient will utilize recommended strategies during swallow to increase swallowing safety with: Modified independent assistance Swallow Study Goal #2 - Progress: Progressing toward goal   Swallow Study Prior Functional Status   Per wife, pt tolerating regular diet at home    General Date of Onset: 11/20/11 (new hemorrhage this am) Type of Study: Bedside swallow evaluation Diet Prior to this Study: NPO Temperature Spikes Noted: No Respiratory Status: Room air Behavior/Cognition: Alert;Cooperative;Pleasant mood Oral Cavity - Dentition: Adequate natural dentition Self-Feeding Abilities: Able to feed self Patient Positioning: Upright in bed Baseline Vocal Quality: Clear Volitional Cough: Weak Volitional Swallow: Able to elicit    Oral/Motor/Sensory Function Overall Oral Motor/Sensory Function: Appears within functional limits for tasks assessed   Ice Chips Ice chips: Not tested   Thin Liquid Thin Liquid: Within functional limits Presentation: Cup;Self Fed;Straw    Nectar Thick Nectar Thick Liquid: Not tested   Honey Thick Honey Thick Liquid: Not tested   Puree Puree: Within functional limits Presentation: Spoon   Solid Solid: Within functional limits   Sareen Randon B. [place], [facility], CCC-SLP [phone]      Leigh Aurora 11/20/2011,12:02 PM

## 2011-11-21 DIAGNOSIS — I619 Nontraumatic intracerebral hemorrhage, unspecified: Secondary | ICD-10-CM

## 2011-11-21 DIAGNOSIS — I6340 Cerebral infarction due to embolism of unspecified cerebral artery: Secondary | ICD-10-CM

## 2011-11-21 DIAGNOSIS — G459 Transient cerebral ischemic attack, unspecified: Secondary | ICD-10-CM

## 2011-11-21 LAB — CBC
MCV: 93.8 fL (ref 78.0–100.0)
Platelets: 489 10*3/uL — ABNORMAL HIGH (ref 150–400)
RBC: 3.56 MIL/uL — ABNORMAL LOW (ref 4.22–5.81)
WBC: 23.7 10*3/uL — ABNORMAL HIGH (ref 4.0–10.5)

## 2011-11-21 LAB — PREPARE FRESH FROZEN PLASMA: Unit division: 0

## 2011-11-21 LAB — GLUCOSE, CAPILLARY: Glucose-Capillary: 98 mg/dL (ref 70–99)

## 2011-11-21 LAB — DIFFERENTIAL
Eosinophils Relative: 1 % (ref 0–5)
Lymphs Abs: 1.9 10*3/uL (ref 0.7–4.0)
Monocytes Relative: 17 % — ABNORMAL HIGH (ref 3–12)

## 2011-11-21 NOTE — Progress Notes (Signed)
Subjective: [name] is more awake this Am although he drops off during our interview. He has no complaints. Family is present and reports no changes. Nursing report is unremarkable.  Objective: Lab: Lab Results  Component Value Date   WBC 23.7* 11/21/2011   HGB 11.4* 11/21/2011   HCT 33.4* 11/21/2011   MCV 93.8 11/21/2011   PLT 489* 11/21/2011  73% segs, 8% lymphs, 17% monos, 1 % eos BMET    Component Value Date/Time   NA 141 11/20/2011 0600   NA 142 11/19/2010 1500   K 4.0 11/20/2011 0600   CL 102 11/20/2011 0600   CO2 26 11/20/2011 0600   GLUCOSE 90 11/20/2011 0600   BUN 15 11/20/2011 0600   BUN 26* 11/19/2010 1500   CREATININE 0.96 11/20/2011 0600   CREATININE 0.8 11/19/2010 1500   CALCIUM 9.3 11/20/2011 0600   GFRNONAA 76* 11/20/2011 0600   GFRAA 88* 11/20/2011 0600   LDL 457 (H), Retic count normal INR 2.51  Imaging: F/u CT June 3, '13: IMPRESSION:  1. Slight retraction of small hematoma in the right posterior  parietal region.  2. Suspect a subtle cortical right occipital infarct without  hemorrhage.   Physical Exam: Filed Vitals:   11/21/11 0600  BP: 140/69  Pulse: 74  Temp:   Resp: 23  Awake but drops off to sleep easily. In no distress HEENT- C&S clear Cor - 2+ radial, IRIR, rate controlled, no JVD Pulm - normal respirations Neuro - Awake, speech is clear (improved from yesterday), decreased left facial droop, improvement in flattening of left naso-labial fold, MAE to command     Assessment/Plan: 1. Neuro - clinically improved. CT with no change in small, 7x82mm hemorrhage. PT/OT recommendations reviewed, SLP recommendations reviewed. Plan- diet to mechanical soft with chopped meats           CIR consult           Transfer from ICU deferred to Dr. Pearlean Brownie and stroke team           Continue current therapy  2. Cardiac - stable  3. ID - Day # 2 rocephin. no foci of infection. Persistent leukocytosis but normal differential  Plan - continue rocephin for now.  4. Hematology -  mild iron deficiency anemia although retic, in normal range, expected to be higher. WBC remains elevated. LDH is elevated. Other studies pending.    Illene Regulus 11/21/2011, 6:58 AM

## 2011-11-21 NOTE — Progress Notes (Signed)
Physical Therapy Treatment Patient Details Name: [name] MRN: [id number] DOB: 02/11/1929 Today's Date: 11/21/2011 Time: 5621-3086 PT Time Calculation (min): 28 min  PT Assessment / Plan / Recommendation Comments on Treatment Session  Mobility improving from last treatment, pt still not attending well to the Left, but can be cued to focus on people object and conversation to the Left.  Good Rehab candidate.    Follow Up Recommendations  Inpatient Rehab    Barriers to Discharge        Equipment Recommendations  Defer to next venue    Recommendations for Other Services Rehab consult  Frequency Min 4X/week   Plan Discharge plan remains appropriate    Precautions / Restrictions Precautions Precautions: Fall   Pertinent Vitals/Pain     Mobility  Bed Mobility Bed Mobility: Supine to Sit;Sitting - Scoot to Edge of Bed Supine to Sit: 4: Min assist Sitting - Scoot to [place] of Bed: 4: Min guard Details for Bed Mobility Assistance: vc's for technique to come up onto R elbow; truncal assist to get initiated up. Transfers Transfers: Sit to Stand;Stand to Sit Sit to Stand: 3: Mod assist Sit to Stand: Patient Percentage:  (75) Stand to Sit: 4: Min assist Details for Transfer Assistance: vc's for hand placement; assist to come forward. Ambulation/Gait Ambulation/Gait Assistance: 4: Min assist Ambulation Distance (Feet): 170 Feet Assistive device: 1 person hand held assist Ambulation/Gait Assistance Details: Mildly unsteady gait through out.  Increased lateral w/shift especially L..  Little to no attension given to objects on the left Gait Pattern: Step-through pattern;Decreased stride length Stairs: No Modified Rankin (Stroke Patients Only) Modified Rankin: Moderately severe disability    Exercises     PT Diagnosis:    PT Problem List:   PT Treatment Interventions:     PT Goals Acute Rehab PT Goals Time For Goal Achievement: 12/04/11 Potential to Achieve Goals: Good PT  Goal: Supine/Side to Sit - Progress: Progressing toward goal PT Goal: Sit to Stand - Progress: Progressing toward goal PT Goal: Stand to Sit - Progress: Progressing toward goal Pt will Ambulate:  (min guard) PT Goal: Ambulate - Progress: Updated due to goal met  Visit Information  Last PT Received On: 11/21/11 Assistance Needed: +2 (safety only)    Subjective Data  Patient Stated Goal: To return home   Cognition  Overall Cognitive Status: Impaired Area of Impairment: Attention Orientation Level: Disoriented to;Place;Time;Situation Behavior During Session: [facility] for tasks performed Current Attention Level: Sustained    Balance  Static Sitting Balance Static Sitting - Balance Support: Feet supported Static Sitting - Level of Assistance: 5: Stand by assistance Static Standing Balance Static Standing - Balance Support: Left upper extremity supported;Right upper extremity supported;During functional activity Static Standing - Level of Assistance: 4: Min assist  End of Session PT - End of Session Activity Tolerance: Patient tolerated treatment well Patient left: in chair;with call bell/phone within reach;with family/visitor present Nurse Communication: Mobility status    Atira Borello, Eliseo Gum 11/21/2011, 2:09 PM  11/21/2011  [place], PT [phone] (pager)

## 2011-11-21 NOTE — Consult Note (Signed)
Physical Medicine and Rehabilitation Consult Reason for Consult: Stroke Referring Physician: Dr. Debby Bud   HPI: [name] is a 78 y.o. right-handed male with history of atrial fibrillation/pacemaker on chronic Coumadin therapy admitted 11/18/2011 with left-sided weakness and numbness and left-sided visual difficulty as well as altered mental status. Cranial CT scan 11/18/2011 showed no acute intracranial abnormalities. His INR on admission was 3.02. Echocardiogram with ejection fraction of 45% without embolus. Recent carotid Doppler study showed no ICA stenosis. A followup cranial CT scan 11/20/2011 showed new 7 x 10 mm hemorrhagic focus right parietal region with mild surrounding edema not present previously. Findings felt likely represent a hemorrhagic infarct related to an embolus. Suspect acute nonhemorrhagic right occipital infarct not visualized previously. MRI was not done due to pacemaker. Neurology had been consulted 11/20/2011 Coumadin therapy discontinued . Hematology consulted due to persistent leukocytosis felt related to urinate tract infection with workup unremarkable felt to be reactive in nature with workup ongoing to rule out myeloproliferative disorder. Presently maintained on dysphagia 3 thin liquid diet. Physical and occupational therapy evaluations completed 11/20/2011 with recommendations of physical medicine rehabilitation consult to consider inpatient rehabilitation services  Review of Systems  Eyes: Positive for blurred vision.  Cardiovascular: Positive for palpitations.  Psychiatric/Behavioral: Positive for memory loss.  All other systems reviewed and are negative.   Past Medical History  Diagnosis Date  . Malignant neoplasm of prostate   . Unspecified psychosis   . Cardiac pacemaker [employer]   . Atrial fibrillation   . Pain in limb   . Encounter for long-term (current) use of other medications   . Unspecified essential hypertension   . BPH (benign  prostatic hypertrophy)   . Other and unspecified hyperlipidemia   . Other acquired absence of organ   . Unspecified hemorrhoids without mention of complication   . Diverticulosis of colon (without mention of hemorrhage)   . History of vein stripping   . Thrombocytosis 07/09/2011  . Normocytic anemia 07/09/2011  . TIA (transient ischemic attack) 07/09/2011  . Benign neoplasm of colon    Past Surgical History  Procedure Date  . Pacemaker placement 1999, replaced 2008    hx tachy-brady  . Vein stripping laser   . Tonsillectomy and adenoidectomy    Family History  Problem Relation Age of Onset  . Diabetes Neg Hx   . Hypertension Neg Hx   . Coronary artery disease Mother   . Coronary artery disease Sister    Social History:  reports that he has quit smoking. His smoking use included Cigarettes. He has a 4 pack-year smoking history. He does not have any smokeless tobacco history on file. He reports that he drinks about 1.5 ounces of alcohol per week. He reports that he does not use illicit drugs. Allergies: No Known Allergies Medications Prior to Admission  Medication Sig Dispense Refill  . atenolol (TENORMIN) 50 MG tablet Take 50 mg by mouth daily.      Marland Kitchen donepezil (ARICEPT) 10 MG tablet Take 10 mg by mouth at bedtime.      . memantine (NAMENDA) 10 MG tablet Take 10 mg by mouth 2 (two) times daily.      Marland Kitchen warfarin (COUMADIN) 5 MG tablet Take 5 mg by mouth every evening.         Home: Home Living Lives With: Spouse Available Help at Discharge: Family Type of Home:  (condo) Home Access: Stairs to enter Secretary/administrator of Steps: 2 Entrance Stairs-Rails: Right Home Layout: Two level;Able to  live on main level with bedroom/bathroom Bathroom Shower/Tub: Walk-in shower;Door Dentist: None  Functional History: Prior Function Able to Take Stairs?: Yes Driving: Yes Vocation: Retired Functional Status:  Mobility: Bed Mobility Bed  Mobility: Supine to Sit Supine to Sit: 3: Mod assist Transfers Transfers: Stand to Sit;Sit to Stand;Stand Pivot Transfers Sit to Stand: 1: +2 Total assist;From bed;From elevated surface Sit to Stand: Patient Percentage: 70% Stand to Sit: 1: +2 Total assist;To chair/3-in-1 Stand to Sit: Patient Percentage: 70% Stand Pivot Transfers: 1: +2 Total assist;From elevated surface Stand Pivot Transfers: Patient Percentage: 60%      ADL: ADL Lower Body Dressing: Performed;+1 Total assistance Where Assessed - Lower Body Dressing: Sopported sit to stand Toilet Transfer: Simulated;+2 Total assistance Toilet Transfer Method: Sit to stand ADL Comments: Pt lethargic therefore difficult to fully test sensation, vision and fine motor skills. However, pt demonstrates deficits related to processing/attention and possibly vision. Unsure of fine motor and sensation  Cognition: Cognition Overall Cognitive Status: Impaired Arousal/Alertness: Awake/alert (lethargic initially but increase alertness with mobility) Orientation Level: Oriented to person Attention: Focused;Sustained Focused Attention: Impaired Focused Attention Impairment: Verbal basic;Functional basic Sustained Attention: Impaired Sustained Attention Impairment: Verbal basic;Functional basic Memory: Impaired Memory Impairment: Retrieval deficit;Decreased recall of new information;Decreased long term memory;Decreased short term memory Decreased Long Term Memory: Verbal basic;Functional basic Decreased Short Term Memory: Verbal basic;Functional basic Awareness: Impaired Awareness Impairment: Intellectual impairment;Emergent impairment;Anticipatory impairment Problem Solving: Impaired Problem Solving Impairment: Verbal basic;Functional basic Executive Function: Sequencing;Organizing;Decision Making;Initiating;Self Monitoring;Self Correcting;Reasoning Reasoning: Impaired Reasoning Impairment: Verbal basic;Functional basic Sequencing:  Appears intact Sequencing Impairment: Functional basic;Verbal basic Organizing: Impaired Organizing Impairment: Verbal basic;Functional basic Decision Making: Impaired Decision Making Impairment: Verbal basic;Functional basic Initiating: Impaired Initiating Impairment: Verbal basic;Functional basic Self Monitoring: Impaired Self Monitoring Impairment: Verbal basic;Functional basic Self Correcting: Impaired Behaviors: Other (comment) Safety/Judgment: Other (comment) (flat affect) Cognition Overall Cognitive Status: Impaired Arousal/Alertness: Awake/alert (lethargic initially but increase alertness with mobility) Orientation Level: Disoriented to;Place;Time;Situation Behavior During Session: Lethargic Cognition - Other Comments: Pt with increase lethargy at end of session making it difficult to complete vision assessment  Blood pressure 123/66, pulse 76, temperature 98.5 F (36.9 C), temperature source Oral, resp. rate 18, height 6\' 1"  (1.854 m), weight 83.8 kg (184 lb 11.9 oz), SpO2 95.00%. Physical Exam  Constitutional: He appears well-nourished.  HENT:  Head: Normocephalic and atraumatic.  Right Ear: External ear normal.  Left Ear: External ear normal.  Nose: Nose normal.  Mouth/Throat: Oropharynx is clear and moist.  Eyes: Conjunctivae are normal. Pupils are equal, round, and reactive to light.  Neck: Normal range of motion. Neck supple. No thyromegaly present.  Cardiovascular:       Cardiac rate controlled  Pulmonary/Chest: Breath sounds normal. No respiratory distress.  Abdominal: He exhibits no distension. There is no tenderness.  Musculoskeletal: He exhibits no edema.  Neurological: He is alert.  Reflex Scores:      Tricep reflexes are 1+ on the right side and 1+ on the left side.      Bicep reflexes are 1+ on the right side and 1+ on the left side.      Brachioradialis reflexes are 1+ on the right side and 1+ on the left side.      Patellar reflexes are 1+ on the  right side and 1+ on the left side.      Achilles reflexes are 1+ on the right side and 1+ on the left side.      Patient needed  ongoing cues to name place situation, he could not recall his living situation or any family members. He followed simple commands. Limited insight and awareness. Poor attention and delayed processing. 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  Left upper grossly 3-4/5 and LLE 4/5.  He's 4+ on the right. No gross sensory deficits.  Skin: Skin is warm and dry.    Results for orders placed during the hospital encounter of 11/18/11 (from the past 24 hour(s))  PREPARE FRESH FROZEN PLASMA     Status: Normal (Preliminary result)   Collection Time   11/20/11 11:30 AM      Component Value Range   Unit Number [id number]     Blood Component Type THAWED PLASMA     Unit division 00     Status of Unit ALLOCATED     Unit tag comment PENDING     Transfusion Status OK TO TRANSFUSE     Unit Number [id number]     Blood Component Type THAWED PLASMA     Unit division 00     Status of Unit ALLOCATED     Unit tag comment PENDING     Transfusion Status OK TO TRANSFUSE    MRSA PCR SCREENING     Status: Normal   Collection Time   11/20/11 12:15 PM      Component Value Range   MRSA by PCR NEGATIVE  NEGATIVE   PROTIME-INR     Status: Abnormal   Collection Time   11/20/11  5:52 PM      Component Value Range   Prothrombin Time 28.3 (*) 11.6 - 15.2 (seconds)   INR 2.60 (*) 0.00 - 1.49   PROTIME-INR     Status: Abnormal   Collection Time   11/20/11  9:21 PM      Component Value Range   Prothrombin Time 27.7 (*) 11.6 - 15.2 (seconds)   INR 2.53 (*) 0.00 - 1.49   GLUCOSE, CAPILLARY     Status: Abnormal   Collection Time   11/21/11 12:53 AM      Component Value Range   Glucose-Capillary 103 (*) 70 - 99 (mg/dL)  PROTIME-INR     Status: Abnormal   Collection Time   11/21/11  4:05 AM      Component Value Range   Prothrombin Time 27.5 (*) 11.6 - 15.2 (seconds)   INR 2.51 (*) 0.00 -  1.49   CBC     Status: Abnormal   Collection Time   11/21/11  4:05 AM      Component Value Range   WBC 23.7 (*) 4.0 - 10.5 (K/uL)   RBC 3.56 (*) 4.22 - 5.81 (MIL/uL)   Hemoglobin 11.4 (*) 13.0 - 17.0 (g/dL)   HCT 78.2 (*) 95.6 - 52.0 (%)   MCV 93.8  78.0 - 100.0 (fL)   MCH 32.0  26.0 - 34.0 (pg)   MCHC 34.1  30.0 - 36.0 (g/dL)   RDW 21.3 (*) 08.6 - 15.5 (%)   Platelets 489 (*) 150 - 400 (K/uL)  DIFFERENTIAL     Status: Abnormal   Collection Time   11/21/11  4:05 AM      Component Value Range   Neutrophils Relative 73  43 - 77 (%)   Lymphocytes Relative 8 (*) 12 - 46 (%)   Monocytes Relative 17 (*) 3 - 12 (%)   Eosinophils Relative 1  0 - 5 (%)   Basophils Relative 1  0 - 1 (%)   Neutro Abs  17.4 (*) 1.7 - 7.7 (K/uL)   Lymphs Abs 1.9  0.7 - 4.0 (K/uL)   Monocytes Absolute 4.0 (*) 0.1 - 1.0 (K/uL)   Eosinophils Absolute 0.2  0.0 - 0.7 (K/uL)   Basophils Absolute 0.2 (*) 0.0 - 0.1 (K/uL)   RBC Morphology RARE NRBCs     Smear Review LARGE PLATELETS PRESENT    LACTATE DEHYDROGENASE     Status: Abnormal   Collection Time   11/21/11  4:05 AM      Component Value Range   LDH 457 (*) 94 - 250 (U/L)   Ct Head Wo Contrast  11/21/2011  *RADIOLOGY REPORT*  Clinical Data: Follow-up intracranial hemorrhage.  CT HEAD WITHOUT CONTRAST  Technique:  Contiguous axial images were obtained from the base of the skull through the vertex without contrast.  Comparison: 11/20/2011 at 0925 hours.  Findings: Slight interval retraction of the small hematoma in the posterior parietal region on the right.  Minimal surrounding edema is stable. Vague area of low-attenuation in the right occipital lobe could be a cortical infarct.  No hemorrhage.  Stable atrophy and ventriculomegaly.  No findings for hemispheric infarction or other areas of hemorrhage.  IMPRESSION:  1.  Slight retraction of small hematoma in the right posterior parietal region. 2.  Suspect a subtle cortical right occipital infarct without hemorrhage.   Original Report Authenticated By: P. Loralie Champagne, M.D.   Ct Head Wo Contrast  11/20/2011  *RADIOLOGY REPORT*  Clinical Data: Progressive neurologic changes with new left facial weakness in a patient fully anticoagulated. History of atrial fibrillation.  CT HEAD WITHOUT CONTRAST  Technique:  Contiguous axial images were obtained from the base of the skull through the vertex without contrast.  Comparison: 11/18/2011  Findings: There is a new 7 x 10 mm right parietal vertex hemorrhage with mild surrounding edema not present previously.  This likely represents an embolic infarction from the patient's atrial fibrillation.  No other hemorrhages are seen.  There is asymmetric hypodensity (image 16 of series 2) of right occipital lobe which could represent a second infarction, non hemorrhagic, versus partial volume averaging of a sulcus.   There is no hydrocephalus or extra-axial fluid.  Moderate atrophy is present with chronic microvascular ischemic change.  There are no CT signs of proximal vascular thrombosis.  Calvarium is intact. Clear sinuses and mastoids.  IMPRESSION: New 7 x 10 mm hemorrhagic focus right parietal region with mild surrounding edema was not present previously.  Findings likely represent a hemorrhagic infarct related to an embolus.  Suspected acute non hemorrhagic right occipital infarct not visualized previously.  Correlate with history of visual difficulty.  Critical Value/emergent results were called by telephone at the time of interpretation on 11/20/2011  at 9:35 a.m.  to  Dr. Debby Bud, who verbally acknowledged these results.  Original Report Authenticated By: Elsie Stain, M.D.    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 Does the need for close, 24 hr/day medical supervision in concert with the patient's rehab needs make it unreasonable for this patient to be served in a less intensive setting?  Yes 2. Co-Morbidities requiring supervision/potential complications: dementia, afib, htn 3. Due to bladder management, bowel management, safety, skin/wound care, disease management, medication administration, pain management and patient education, does the patient require 24 hr/day rehab nursing? Yes 4. Does the patient require coordinated care of a physician, rehab nurse, PT (1-2 hrs/day, 5 days/week), OT (1-2 hrs/day, 5 days/week) and SLP (1-2 hrs/day,  5 days/week) to address physical and functional deficits in the context of the above medical diagnosis(es)? Yes Addressing deficits in the following areas: balance, endurance, locomotion, strength, transferring, bowel/bladder control, bathing, dressing, feeding, grooming, toileting, cognition, speech, language, swallowing and psychosocial support 5. Can the patient actively participate in an intensive therapy program of at least 3 hrs of therapy per day at least 5 days per week? Yes 6. The potential for patient to make measurable gains while on inpatient rehab is excellent 7. Anticipated functional outcomes upon discharge from inpatient rehab are supervision with PT, supervision with OT, supervision to min assist with SLP. 8. Estimated rehab length of stay to reach the above functional goals is: 2 weeks 9. Does the patient have adequate social supports to accommodate these discharge functional goals? Yes 10. Anticipated D/C setting: Home 11. Anticipated post D/C treatments: HH therapy 12. Overall Rehab/Functional Prognosis: excellent  RECOMMENDATIONS: This patient's condition is appropriate for continued rehabilitative care in the following setting: CIR Patient has agreed to participate in recommended program. Yes/ wife--discussed at length Note that insurance prior authorization may be required for reimbursement for recommended care.  Comment: Rehab RN to follow up   Ivory Broad, MD 11/21/2011

## 2011-11-21 NOTE — Progress Notes (Signed)
Stroke Team Progress Note  HISTORY TREYLIN BURTCH is an 78 y.o. male history of atrial fibrillation on Coumadin, cardiac pacemaker, previous TIA and dementia, presenting with acute onset of visual difficulty on the left side as well as weakness and numbness involving his left extremities starting at 5:30 PM 11/18/2011. INR was 3.02. CT scan of his head showed no acute intracranial abnormality. NIH stroke score was 4. Patient's visual difficulty on the left began to resolve while in the emergency room. Repeat NIH score was 3. Patient was not a candidate for thrombolytic therapy because of his therapeutic INR. He was admitted for further evaluation and treatment.  SUBJECTIVE No complaints. RN at bedside. He has remained stable overnight. Blood pressure has been adequately controlled. He denies any headaches. CT scan yesterday evening showed stable appearance of the hemorrhage. Warfarin was held last night but INR this a.m. is yet therapeutic.  OBJECTIVE Most recent Vital Signs: Filed Vitals:   11/21/11 0400 11/21/11 0500 11/21/11 0600 11/21/11 0700  BP: 136/72 130/66 140/69 123/66  Pulse: 71 69 74 76  Temp:      TempSrc:      Resp: 19 16 23 18   Height:      Weight:      SpO2: 95% 93% 95% 95%   CBG (last 3)  Basename 11/21/11 0053 11/18/11 2258  GLUCAP 103* 103*   Intake/Output from previous day: 06/03 0701 - 06/04 0700 In: 80 [P.O.:30; IV Piggyback:50] Out: 635 [Urine:635] IV Fluid Intake:     MEDICATIONS    . atenolol  50 mg Oral Daily  . cefTRIAXone (ROCEPHIN)  IV  1 g Intravenous Q24H  . cefTRIAXone (ROCEPHIN)  IV  2 g Intravenous Once  . donepezil  10 mg Oral QHS  . memantine  10 mg Oral BID  . DISCONTD: anti-inhibitor coagulant complex  500 Units Intravenous STAT  . DISCONTD: anti-inhibitor coagulant complex  526 Units Intravenous STAT  . DISCONTD: phytonadione (VITAMIN K) IV  10 mg Intravenous STAT  . DISCONTD: Warfarin - Pharmacist Dosing Inpatient   Does not apply q1800   PRN:  DISCONTD: anti-inhibitor coagulant complex  Diet:  Dysphagia 3 thin liquids Activity:  Bedrest DVT Prophylaxis:  SCDs   CLINICALLY SIGNIFICANT STUDIES Basic Metabolic Panel:  Lab 11/20/11 1610 11/19/11 0525  NA 141 143  K 4.0 3.8  CL 102 107  CO2 26 24  GLUCOSE 90 83  BUN 15 19  CREATININE 0.96 0.88  CALCIUM 9.3 9.2  MG -- --  PHOS -- --   Liver Function Tests:  Lab 11/19/11 0525 11/18/11 1822  AST 23 26  ALT 13 14  ALKPHOS 59 65  BILITOT 1.7* 1.5*  PROT 6.0 6.8  ALBUMIN 3.8 4.1   CBC:  Lab 11/21/11 0405 11/20/11 0600 11/19/11 0525  WBC 23.7* 26.5* --  NEUTROABS 17.4* -- 13.7*  HGB 11.4* 11.1* --  HCT 33.4* 32.8* --  MCV 93.8 94.3 --  PLT 489* 512* --   Coagulation:  Lab 11/21/11 0405 11/20/11 2121 11/20/11 1752 11/20/11 0600  LABPROT 27.5* 27.7* 28.3* 25.6*  INR 2.51* 2.53* 2.60* 2.29*   Cardiac Enzymes:  Lab 11/18/11 1822  CKTOTAL 80  CKMB 2.8  CKMBINDEX --  TROPONINI <0.30   Urinalysis:  Lab 11/18/11 2131  COLORURINE AMBER*  LABSPEC 1.025  PHURINE 5.0  GLUCOSEU NEGATIVE  HGBUR NEGATIVE  BILIRUBINUR SMALL*  KETONESUR 15*  PROTEINUR NEGATIVE  UROBILINOGEN 1.0  NITRITE NEGATIVE  LEUKOCYTESUR TRACE*   Lipid Panel  Component Value Date/Time   CHOL 116 11/19/2011 0525   TRIG 58 11/19/2011 0525   HDL 47 11/19/2011 0525   CHOLHDL 2.5 11/19/2011 0525   VLDL 12 11/19/2011 0525   LDLCALC 57 11/19/2011 0525   HgbA1C  Lab Results  Component Value Date   HGBA1C 5.7* 11/18/2011    Urine Drug Screen:     Component Value Date/Time   LABOPIA NONE DETECTED 11/18/2011 2155   COCAINSCRNUR NONE DETECTED 11/18/2011 2155   LABBENZ NONE DETECTED 11/18/2011 2155   AMPHETMU NONE DETECTED 11/18/2011 2155   THCU NONE DETECTED 11/18/2011 2155   LABBARB NONE DETECTED 11/18/2011 2155    Thyroid Function Tests:  Lab 11/19/11 0525  TSH 4.260  T4TOTAL --  FREET4 --  T3FREE --  THYROIDAB --   Anemia Panel:  Lab 11/19/11 1340  VITAMINB12 585  FOLATE 17.0  FERRITIN  1006*  TIBC 242  IRON 39*  RETICCTPCT 3.2*   CT of the brain  11/21/2011   1.  Slight retraction of small hematoma in the right posterior parietal region. 2.  Suspect a subtle cortical right occipital infarct without hemorrhage.  11/20/2011   New 7 x 10 mm hemorrhagic focus right parietal region with mild surrounding edema was not present previously.  Findings likely represent a hemorrhagic infarct related to an embolus.  Suspected acute non hemorrhagic right occipital infarct not visualized previously.  Correlate with history of visual difficulty.  Critical Value/emergent results were called by telephone at the time of interpretation on 11/20/2011  at 9:35 a.m.  to  Dr. Debby Bud, who verbally acknowledged these results.  11/18/2011  No acute intracranial abnormality.  MRI of the brain  pacer  MRA of the brain  pacer  2D Echocardiogram  EF 40-45% with no source of embolus. consistent with moderate pulmonary hypertension.  Carotid Doppler  07/09/2011 No internal carotid artery stenosis bilaterally. Vertebrals with antegrade flow bilaterally.   CXR  11/18/2011  Shallow inspiration.  No evidence of active pulmonary disease.    EKG  Ventricular paced rhythm.   Therapy Recommendations PT -CIR, OT -CIR  Physical Exam pleasant elderly Caucasian male currently not in distressAwake alert. Afebrile. Head is nontraumatic. Neck is supple without bruit. Hearing is normal. Cardiac exam no murmur or gallop. Lungs are clear to auscultation. Distal pulses are well felt.  Neurological Exam : awake alert disoriented x3. Speech and language appear normal. Diminished attention, short-term memory, registration and recall. Follows only a few simple one and occasional two-step commands. Extraocular moments are full range without nystagmus. Does not blink to threat on the left. Not cooperative for detailed visual field testing exam. Visual acuity. and fundi could not be adequately tested. There is minimum left lower facial  symmetry. Tongue is midline. Motor system exam is limited by patient's cooperation but able to move all her activities against gravity without focal weakness. Sensation and cognition appears intact. Gait was not tested.  ASSESSMENT Mr. JERET GOYER is a 78 y.o. male with an ischemic infarct on arrival in setting of therapeutic INR, now with new 7 x 10 mm hemorrhagic focus right parietal region with mild surrounding edema. Hemorrhagic transformation likely secondary to therapeutic INR vs amyloid angiopathy. On warfarin prior to admission, INR 3.03. Now on warfarin for secondary stroke prevention, INR 2.96. Patient with resultant increased confusion, left hemiparesis.  Hemorrhage on CT last night is decreasing. He did not receive FEIBA.  -baseline dementia -pacemaker -atrial fibrillation, on chronic coumadin -hypertension -TIA 06/2011  Hospital day #  3  TREATMENT/PLAN -stop warfarin for secondary stroke prevention due to hemorrhagic transformation. Close neurological followup. Repeat  CT scan and INR in the circumstance of neuro worsening -OOB. Therapy evals -CT, INR in am -If there is neurological worsening or increase hemorrhage size may need to reverse with anticoagulation reversal protocol. -keep in ICU -start ASA when INR < 2.0 This patient is critically ill and at significant risk of neurological worsening, death and care requires constant monitoring of vital signs, hemodynamics,respiratory and cardiac monitoring,review of multiple databases, neurological assessment, discussion with family, other specialists and medical decision making of high complexity. I spent 30 minutes of neurocritical care time  in the care of  this patient.  -discussed with wife and answered questions.  Joaquin Music, ANP-BC, GNP-BC [facility] Pager: [phone] 11/21/2011 7:55 AM  Scribe for Dr. Delia Heady, Stroke Center Medical Director. He has personally reviewed chart, pertinent data,  examined the patient and developed the plan of care. Pager:  [phone]

## 2011-11-22 ENCOUNTER — Inpatient Hospital Stay (HOSPITAL_COMMUNITY): Payer: Medicare Other

## 2011-11-22 ENCOUNTER — Encounter (HOSPITAL_COMMUNITY): Payer: Self-pay

## 2011-11-22 ENCOUNTER — Inpatient Hospital Stay (HOSPITAL_COMMUNITY)
Admission: AD | Admit: 2011-11-22 | Discharge: 2011-12-01 | DRG: 945 | Disposition: A | Discharge status: Skilled Nursing Facility | Payer: Medicare Other | Source: Ambulatory Visit | Attending: Physical Medicine & Rehabilitation | Admitting: Physical Medicine & Rehabilitation

## 2011-11-22 DIAGNOSIS — Z860100 Personal history of colon polyps, unspecified: Secondary | ICD-10-CM

## 2011-11-22 DIAGNOSIS — I4891 Unspecified atrial fibrillation: Secondary | ICD-10-CM | POA: Diagnosis present

## 2011-11-22 DIAGNOSIS — F0390 Unspecified dementia without behavioral disturbance: Secondary | ICD-10-CM | POA: Diagnosis present

## 2011-11-22 DIAGNOSIS — N400 Enlarged prostate without lower urinary tract symptoms: Secondary | ICD-10-CM | POA: Diagnosis present

## 2011-11-22 DIAGNOSIS — T45515A Adverse effect of anticoagulants, initial encounter: Secondary | ICD-10-CM

## 2011-11-22 DIAGNOSIS — Z87891 Personal history of nicotine dependence: Secondary | ICD-10-CM

## 2011-11-22 DIAGNOSIS — Z8546 Personal history of malignant neoplasm of prostate: Secondary | ICD-10-CM

## 2011-11-22 DIAGNOSIS — I6330 Cerebral infarction due to thrombosis of unspecified cerebral artery: Secondary | ICD-10-CM

## 2011-11-22 DIAGNOSIS — Z5189 Encounter for other specified aftercare: Principal | ICD-10-CM

## 2011-11-22 DIAGNOSIS — Z7901 Long term (current) use of anticoagulants: Secondary | ICD-10-CM

## 2011-11-22 DIAGNOSIS — I619 Nontraumatic intracerebral hemorrhage, unspecified: Secondary | ICD-10-CM

## 2011-11-22 DIAGNOSIS — Z950 Presence of cardiac pacemaker: Secondary | ICD-10-CM

## 2011-11-22 DIAGNOSIS — G459 Transient cerebral ischemic attack, unspecified: Secondary | ICD-10-CM

## 2011-11-22 DIAGNOSIS — I6340 Cerebral infarction due to embolism of unspecified cerebral artery: Secondary | ICD-10-CM | POA: Diagnosis present

## 2011-11-22 DIAGNOSIS — D72829 Elevated white blood cell count, unspecified: Secondary | ICD-10-CM | POA: Diagnosis present

## 2011-11-22 DIAGNOSIS — Z8673 Personal history of transient ischemic attack (TIA), and cerebral infarction without residual deficits: Secondary | ICD-10-CM

## 2011-11-22 LAB — CBC
Hemoglobin: 12.1 g/dL — ABNORMAL LOW (ref 13.0–17.0)
Platelets: 500 10*3/uL — ABNORMAL HIGH (ref 150–400)
RBC: 3.76 MIL/uL — ABNORMAL LOW (ref 4.22–5.81)
WBC: 19.5 10*3/uL — ABNORMAL HIGH (ref 4.0–10.5)

## 2011-11-22 LAB — PROTIME-INR: Prothrombin Time: 25.4 seconds — ABNORMAL HIGH (ref 11.6–15.2)

## 2011-11-22 MED ORDER — DEXTROSE 5 % IV SOLN: 1.0000 g | INTRAVENOUS | Status: DC

## 2011-11-22 MED ORDER — POLYETHYLENE GLYCOL 3350 17 G PO PACK
17.0000 g | PACK | Freq: Every day | ORAL | Status: DC | PRN
  Filled 2011-11-22: qty 1

## 2011-11-22 MED ORDER — DONEPEZIL HCL 10 MG PO TABS
10.0000 mg | ORAL_TABLET | Freq: Every day | ORAL | Status: DC
  Administered 2011-11-22 – 2011-11-30 (×9): 10 mg via ORAL
  Filled 2011-11-22 (×10): qty 1

## 2011-11-22 MED ORDER — MEMANTINE HCL 10 MG PO TABS
10.0000 mg | ORAL_TABLET | Freq: Two times a day (BID) | ORAL | Status: DC
  Administered 2011-11-22 – 2011-12-01 (×18): 10 mg via ORAL
  Filled 2011-11-22 (×20): qty 1

## 2011-11-22 MED ORDER — ACETAMINOPHEN 325 MG PO TABS
325.0000 mg | ORAL_TABLET | ORAL | Status: DC | PRN
Start: 2011-11-22 — End: 2011-12-01

## 2011-11-22 MED ORDER — SORBITOL 70 % SOLN: 30.0000 mL | Freq: Every day | Status: DC | PRN

## 2011-11-22 MED ORDER — ATENOLOL 50 MG PO TABS
50.0000 mg | ORAL_TABLET | Freq: Every day | ORAL | Status: DC
  Administered 2011-11-23 – 2011-11-24 (×2): 50 mg via ORAL
  Filled 2011-11-22 (×5): qty 1

## 2011-11-22 MED ORDER — ONDANSETRON HCL 4 MG PO TABS: 4.0000 mg | ORAL_TABLET | Freq: Four times a day (QID) | ORAL | Status: DC | PRN

## 2011-11-22 MED ORDER — ONDANSETRON HCL 4 MG/2ML IJ SOLN: 4.0000 mg | Freq: Four times a day (QID) | INTRAMUSCULAR | Status: DC | PRN

## 2011-11-22 NOTE — Plan of Care (Signed)
Problem: Consults Goal: RH STROKE PATIENT EDUCATION See Patient Education module for education specifics  Outcome: Progressing Information given to patient and patient's wife in room

## 2011-11-22 NOTE — PMR Pre-admission (Signed)
PMR Admission Coordinator Pre-Admission Assessment  Patient: [name] is an 78 y.o., male MRN: [id number] DOB: [birth year] Height: 6\' 1"  (185.4 cm) Weight: 83.8 kg (184 lb 11.9 oz)  Insurance Information HMO: X    PPO:      PCP:      IPA:      80/20:      OTHER:  PRIMARY: AARP MCR complete      Policy#: [id number]      Subscriber: patient CM Name: Haskel Khan      Phone#: [phone]     Fax#:  Pre-Cert#: [id number]    Employer: retired Benefits:  Phone #: [phone]     Name:  Dolores Hoose. Date: 06/19/09     Deduct: $0      Out of Pocket Max: $3900/$603.83 year to date      Life Max: 0 CIR: $220/day 1-7; $0/day 8-      SNF: $50/day 1-20; $150/day 21-40; $0 41-100 Outpatient:     Co-Pay: $40/visit PT, OT Home Health: 100%      Co-Pay: 0; 20% for DME and supplies DME: 80%     Co-Pay: 20% Providers: in network SECONDARY:       Policy#:       Subscriber:  CM Name:       Phone#:      Fax#:  Pre-Cert#:       Employer:  Benefits:  Phone #:      Name:  Eff. 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 right-handed male with history of atrial fibrillation/pacemaker on chronic Coumadin therapy as well as documented dementia and TIA 06/2011 admitted 11/18/2011 with left-sided weakness and numbness and left-sided visual difficulty as well as altered mental status. Patient independent prior to  admission per wife and driving. Cranial CT scan 11/18/2011 showed no acute intracranial abnormalities. His INR on admission was 3.02.  Echocardiogram with ejection fraction of 45% without embolus. Recent carotid Doppler study showed no ICA stenosis. A followup cranial CT scan 11/20/2011 showed new 7 x 10 mm hemorrhagic focus right parietal region with mild surrounding edema not present previously. Suspect acute nonhemorrhagic right occipital infarct not visualized previously. MRI was not done due to pacemaker. Neurology had been consulted 11/20/2011 Coumadin therapy discontinued secondary to hemorrhagic transformation and plan to begin aspirin therapy 81 mg daily once INR less than 2.00 with latest INR 11/22/2011 of 2.27 . Hematology consulted due to persistent leukocytosis felt related to urinate tract infection with workup unremarkable felt to be reactive in nature with workup ongoing to rule out myeloproliferative disorder. Presently maintained on dysphagia 3 thin liquid diet. Physical and occupational therapy evaluations completed  11/20/2011 with recommendations of physical medicine rehabilitation consult to consider inpatient rehabilitation services  Total: 6     Past Medical History  Past Medical History  Diagnosis Date  . Malignant neoplasm of prostate   . Unspecified psychosis   . Cardiac pacemaker [employer]   . Atrial fibrillation   . Pain in limb   . Encounter for long-term (current) use of other medications   . Unspecified essential hypertension   . BPH (benign prostatic hypertrophy)   . Other and unspecified hyperlipidemia   . Other acquired absence of organ   . Unspecified hemorrhoids without mention of complication   . Diverticulosis of colon (without mention of hemorrhage)   . History of vein stripping   . Thrombocytosis 07/09/2011  . Normocytic anemia 07/09/2011  . TIA (transient ischemic attack) 07/09/2011  . Benign neoplasm of colon     Family History  family  history includes Coronary artery disease in his mother and sister.  There is no history of Diabetes and Hypertension.  Prior Rehab/Hospitalizations: no prior CIR stays   Current Medications  Current facility-administered medications:atenolol (TENORMIN) tablet 50 mg, 50 mg, Oral, Daily, Eduard Clos, MD, 50 mg at 11/21/11 0913;  cefTRIAXone (ROCEPHIN) 1 g in dextrose 5 % 50 mL IVPB, 1 g, Intravenous, Q24H, Jacques Navy, MD, 1 g at 11/21/11 0907;  donepezil (ARICEPT) tablet 10 mg, 10 mg, Oral, QHS, Eduard Clos, MD, 10 mg at 11/21/11 2141 memantine (NAMENDA) tablet 10 mg, 10 mg, Oral, BID, Eduard Clos, MD, 10 mg at 11/21/11 2141  Patients Current Diet: Dysphagia  Precautions / Restrictions Precautions Precautions: Fall Precautions/Special Needs: Behavior Precaution Comments: some mild dementia Restrictions Weight Bearing Restrictions: No   Prior Activity Level Community (5-7x/wk): walked daily with his wife and drove Journalist, newspaper / Equipment Home Assistive Devices/Equipment: Eyeglasses Home Adaptive Equipment: None  Prior Functional Level Prior Function Level of Independence: Independent Able to Take Stairs?: Yes Driving: Yes Vocation: Retired  Current Functional Level Cognition  Arousal/Alertness: Awake/alert Overall Cognitive Status: Impaired Overall Cognitive Status: Impaired Current Attention Level: Sustained Orientation Level: Oriented to person Cognition - Other Comments: Pt with increase lethargy at end of session making it difficult to complete vision assessment Attention: Focused;Sustained Focused Attention: Impaired Focused Attention Impairment: Verbal basic;Functional basic Sustained Attention: Impaired Sustained Attention Impairment: Verbal basic;Functional basic Memory: Impaired Memory Impairment: Retrieval deficit;Decreased recall of new information;Decreased long term memory;Decreased short term memory Decreased Long Term  Memory: Verbal basic;Functional basic Decreased Short Term Memory: Verbal basic;Functional basic Awareness: Impaired Awareness Impairment: Intellectual impairment;Emergent impairment;Anticipatory impairment Problem Solving: Impaired Problem Solving Impairment: Verbal basic;Functional basic Executive Function: Sequencing;Organizing;Decision Making;Initiating;Self Monitoring;Self Correcting;Reasoning Reasoning: Impaired Reasoning Impairment: Verbal basic;Functional basic Sequencing: Appears intact Sequencing Impairment: Functional basic;Verbal basic Organizing: Impaired Organizing Impairment: Verbal basic;Functional basic Decision Making: Impaired Decision Making Impairment: Verbal basic;Functional basic Initiating: Impaired Initiating Impairment: Verbal basic;Functional basic Self Monitoring: Impaired Self Monitoring Impairment: Verbal basic;Functional basic Self Correcting: Impaired Behaviors: Other (comment) Safety/Judgment: Other (comment) (flat affect)    Extremity Assessment (includes Sensation/Coordination)  RUE ROM/Strength/Tone: Within functional levels RUE Sensation: WFL - Light Touch RUE Coordination: WFL - gross/fine motor  RLE ROM/Strength/Tone: Unable to fully assess;Due to impaired cognition RLE Sensation: WFL - Light Touch RLE Coordination: WFL - gross motor    ADLs  Lower Body Dressing: Performed;+1 Total assistance Where Assessed - Lower Body Dressing: Sopported sit to stand Toilet Transfer: Simulated;+2 Total assistance Toilet Transfer: Patient Percentage: 60% Toilet Transfer Method: Sit to stand  Toileting - Clothing Manipulation and Hygiene: Simulated;+1 Total assistance Where Assessed - Toileting Clothing Manipulation and Hygiene: Standing ADL Comments: Pt lethargic therefore difficult to fully test sensation, vision and fine motor skills. However, pt demonstrates deficits related to processing/attention and possibly vision. Unsure of fine motor and  sensation    Mobility  Bed Mobility: Supine to Sit;Sitting - Scoot to Edge of Bed Supine to Sit: 4: Min assist Sitting - Scoot to [place] of Bed: 4: Min guard    Transfers  Transfers: Sit to Stand;Stand to Sit Sit to Stand: 3: Mod assist Sit to Stand: Patient Percentage:  (75) Stand to Sit: 4: Min assist Stand to Sit: Patient Percentage: 70% Stand Pivot Transfers: 1: +2 Total assist;From elevated surface Stand Pivot Transfers: Patient Percentage: 60%    Ambulation / Gait / Stairs / Psychologist, prison and probation services  Ambulation/Gait Ambulation/Gait Assistance: 4: Min Environmental consultant (Feet): 170 Feet Assistive device: 1 person hand held assist Ambulation/Gait Assistance Details: Mildly unsteady gait through out.  Increased lateral w/shift especially L..  Little to no attension given to objects on the left Gait Pattern: Step-through pattern;Decreased stride length Stairs: No    Posture / Balance Static Sitting Balance Static Sitting - Balance Support: Feet supported Static Sitting - Level of Assistance: 5: Stand by assistance Static Standing Balance Static Standing - Balance Support: Left upper extremity supported;Right upper extremity supported;During functional activity Static Standing - Level of Assistance: 4: Min assist Static Standing - Comment/# of Minutes: ~5 minutes prior to transfer to recliner     Previous Home Environment Living Arrangements: Spouse/significant other Lives With: Spouse Available Help at Discharge: Family Type of Home: House Home Layout: Two level;Able to live on main level with bedroom/bathroom Alternate Level Stairs-Rails:  (N/A) Home Access: Stairs to enter Entrance Stairs-Rails: Right Entrance Stairs-Number of Steps: 2 Bathroom Shower/Tub: Walk-in shower;Door [employer] Toilet: Standard Bathroom Accessibility: Yes How Accessible: Accessible via walker Home Care Services: No  Discharge Living Setting Plans for Discharge Living Setting: Patient's  home;House;Lives with (comment) (wife) Type of Home at Discharge: House Discharge Home Layout: Two level;Able to live on main level with bedroom/bathroom Alternate Level Stairs-Rails:  (doesn't have to use) Discharge Home Access: Stairs to enter Entrance Stairs-Rails: Right Entrance Stairs-Number of Steps: 2 Discharge Bathroom Shower/Tub: Walk-in shower;Door Discharge Bathroom Toilet: Standard Discharge Bathroom Accessibility: Yes How Accessible: Accessible via walker Do you have any problems obtaining your medications?: No  Social/Family/Support Systems Patient Roles: Spouse;Parent Contact Information: home [phone] Anticipated Caregiver: wife- Ondre Salvetti Anticipated Caregiver's Contact Information: home [phone], cell [phone] Ability/Limitations of Caregiver: can provide assistance Caregiver Availability: 24/7 Discharge Plan Discussed with Primary Caregiver: Yes Is Caregiver In Agreement with Plan?: Yes Does Caregiver/Family have Issues with Lodging/Transportation while Pt is in Rehab?: No  Goals/Additional Needs Patient/Family Goal for Rehab: Supervision PT/OT; S-min A with SLP Expected length of stay: 2 weeks Cultural Considerations: none Dietary Needs: currently D3 thin liquids Equipment Needs: to be determined Pt/Family Agrees to Admission and willing to participate: Yes Program Orientation Provided & Reviewed with Pt/Caregiver Including Roles  & Responsibilities: Yes  Patient Condition: This patient's condition remains as documented in the Consult dated 11/21/11 @10 :01, in which the Rehabilitation Physician determined and documented that the patient's condition is appropriate for intensive rehabilitative care in an inpatient rehabilitation facility.  Preadmission Screen Completed By:  Oletta Darter, 11/22/2011 10:10 AM ______________________________________________________________________   Discussed status with Dr. Riley Kill on 11/22/11 at 10:00 and received  telephone approval for admission today.  Admission Coordinator:  Oletta Darter, [place] /Date6/5/13

## 2011-11-22 NOTE — Progress Notes (Signed)
Subjective: Awake, knows he is in the hospital. Was trying to get out of bed. He has no complaints  Objective: Lab: Lab Results  Component Value Date   WBC 19.5* 11/22/2011   HGB 12.1* 11/22/2011   HCT 35.2* 11/22/2011   MCV 93.6 11/22/2011   PLT 500* 11/22/2011   BMET    Component Value Date/Time   NA 141 11/20/2011 0600   NA 142 11/19/2010 1500   K 4.0 11/20/2011 0600   CL 102 11/20/2011 0600   CO2 26 11/20/2011 0600   GLUCOSE 90 11/20/2011 0600   BUN 15 11/20/2011 0600   BUN 26* 11/19/2010 1500   CREATININE 0.96 11/20/2011 0600   CREATININE 0.8 11/19/2010 1500   CALCIUM 9.3 11/20/2011 0600   GFRNONAA 76* 11/20/2011 0600   GFRAA 88* 11/20/2011 0600   INR 2.27  Imaging: CT brain - follow-up 11/22/11: Comparison: 11/20/2011.  Findings: The small hemorrhagic infarct in the right posterior  parietal/occipital area is stable. There are multiple other small  infarcts in the cerebellum and right and left occipital lobes.  Findings most likely due to an embolic event. No hemispheric  infarction.  IMPRESSION:  1. Multiple small cerebellar and occipital lobe infarcts.  2. Stable small hemorrhagic infarct in the right posterior  parietal/occipital area.   Physical Exam: Filed Vitals:   11/22/11 0500  BP: 115/67  Pulse: 68  Temp:   Resp: 17    Intake/Output Summary (Last 24 hours) at 11/22/11 1610 Last data filed at 11/22/11 0100  Gross per 24 hour  Intake    600 ml  Output    545 ml  Net     55 ml  Awake and alert, knows he is in the hospital and that he went downstairs for x-ray. HEENT- C&S clear, PERRLA Chest - good breath sounds Cor- IRIR rate controlled Abd- soft, BS+ Neuro - A&O x 2, CN II-XII - normal facial symmetry, EOMI; MS - equal, MAE, Cerebellar - no tremor.      Assessment/Plan: 1. Neuro - CT with no change in small hemorrhage. His presentation did suggest a thrombotic CVA but last CT with multiple infarcts favors embolic stroke per radiology. No source of emboli. Plan For ASA  alone once INR <2.0  For transfer to floor vs CIR   2. Cardiac - he has remained stable with rate controlled a. Fib. BP stable  3. ID - Day # 3 rocephin although no foci of infection has been identified and he has been afebrile.  Plan -  Complete 5 days of antibiotics  4. Hematology - Hgb has improved, WBC is trending down.  Dispo - for CIR if insurance approves. Discharge dictation done in anticipation of CIR transfer # [id number].   Illene Regulus 11/22/2011, 6:51 AM

## 2011-11-22 NOTE — Progress Notes (Signed)
Patient and Wife oriented to unit when arrived about 1315- given blue booklet and discussed rehab schedule/process. Patient's wife verbalized understanding. Patient alert and oriented to self and place on occasion. Patient has poor safety behaviors- impulsive. To place on bed alarm and quick release. Patient continent with urine and bowel with prompting and min assist. Patient was transferred to toilet this evening with therapist- require mod assist stand pivot. Patient had bowel movement today. Vitals stable. See FIM for other functional detailsContinue with plan of care.

## 2011-11-22 NOTE — Plan of Care (Signed)
Problem: Phase II Progression Outcomes Goal: Neuro status stablized/improved Outcome: Adequate for Discharge To rehab

## 2011-11-22 NOTE — H&P (Signed)
Physical Medicine and Rehabilitation Admission H&P  Chief Complaint   Patient presents with   .  Code Stroke   :  [id number] [name] is a 78 y.o. right-handed male with history of atrial fibrillation/pacemaker on chronic Coumadin therapy as well as documented dementia and TIA 06/2011 admitted 11/18/2011 with left-sided weakness and numbness and left-sided visual difficulty as well as altered mental status. Patient independent prior to admission per wife and driving. Cranial CT scan 11/18/2011 showed no acute intracranial abnormalities. His INR on admission was 3.02.  Echocardiogram with ejection fraction of 45% without embolus. Recent carotid Doppler study showed no ICA stenosis. A followup cranial CT scan 11/20/2011 showed new 7 x 10 mm hemorrhagic focus right parietal region with mild surrounding edema not present previously. Suspect acute nonhemorrhagic right occipital infarct not visualized previously. MRI was not done due to pacemaker. Neurology had been consulted 11/20/2011 Coumadin therapy discontinued secondary to hemorrhagic transformation and plan to begin aspirin therapy 81 mg daily once INR less than 2.00 with latest INR 11/22/2011 of 2.27 . Hematology consulted due to persistent leukocytosis felt related to urinate tract infection with workup unremarkable felt to be reactive in nature with workup ongoing to rule out myeloproliferative disorder. Presently maintained on dysphagia 3 thin liquid diet. Physical and occupational therapy evaluations completed 11/20/2011 with recommendations of physical medicine rehabilitation consult to consider inpatient rehabilitation services  Review of Systems  Eyes: Positive for blurred vision.  Cardiovascular: Positive for palpitations.  Psychiatric/Behavioral: Positive for memory loss.  All other systems reviewed and are negative  Past Medical History   Diagnosis  Date   .  Malignant neoplasm of prostate    .  Unspecified psychosis    .  Cardiac  pacemaker [employer]    .  Atrial fibrillation    .  Pain in limb    .  Encounter for long-term (current) use of other medications    .  Unspecified essential hypertension    .  BPH (benign prostatic hypertrophy)    .  Other and unspecified hyperlipidemia    .  Other acquired absence of organ    .  Unspecified hemorrhoids without mention of complication    .  Diverticulosis of colon (without mention of hemorrhage)    .  History of vein stripping    .  Thrombocytosis  07/09/2011   .  Normocytic anemia  07/09/2011   .  TIA (transient ischemic attack)  07/09/2011   .  Benign neoplasm of colon     Past Surgical History   Procedure  Date   .  Pacemaker placement  1999, replaced 2008     hx tachy-brady   .  Vein stripping laser    .  Tonsillectomy and adenoidectomy     Family History   Problem  Relation  Age of Onset   .  Diabetes  Neg Hx    .  Hypertension  Neg Hx    .  Coronary artery disease  Mother    .  Coronary artery disease  Sister     Social History: reports that he has quit smoking. His smoking use included Cigarettes. He has a 4 pack-year smoking history. He does not have any smokeless tobacco history on file. He reports that he drinks about 1.5 ounces of alcohol per week. He reports that he does not use illicit drugs.  Allergies: No Known Allergies  Medications Prior to Admission   Medication  Sig  Dispense  Refill   .  atenolol (TENORMIN) 50 MG tablet  Take 50 mg by mouth daily.     Marland Kitchen  donepezil (ARICEPT) 10 MG tablet  Take 10 mg by mouth at bedtime.     .  memantine (NAMENDA) 10 MG tablet  Take 10 mg by mouth 2 (two) times daily.     Marland Kitchen  warfarin (COUMADIN) 5 MG tablet  Take 5 mg by mouth every evening.      Home:  Home Living  Lives With: Spouse  Available Help at Discharge: Family  Type of Home: (condo)  Home Access: Stairs to enter  Secretary/administrator of Steps: 2  Entrance Stairs-Rails: Right  Home Layout: Two level;Able to live on main level with  bedroom/bathroom  Bathroom Shower/Tub: Walk-in shower;Door  Training and development officer: None  Functional History:  Prior Function  Able to Take Stairs?: Yes  Driving: Yes  Vocation: Retired  Functional Status:  Mobility:  Bed Mobility  Bed Mobility: Supine to Sit;Sitting - Scoot to Edge of Bed  Supine to Sit: 4: Min assist  Sitting - Scoot to [place] of Bed: 4: Min guard  Transfers  Transfers: Sit to Stand;Stand to Sit  Sit to Stand: 3: Mod assist  Sit to Stand: Patient Percentage: (75)  Stand to Sit: 4: Min assist  Stand to Sit: Patient Percentage: 70%  Stand Pivot Transfers: 1: +2 Total assist;From elevated surface  Stand Pivot Transfers: Patient Percentage: 60%  Ambulation/Gait  Ambulation/Gait Assistance: 4: Min assist  Ambulation Distance (Feet): 170 Feet  Assistive device: 1 person hand held assist  Ambulation/Gait Assistance Details: Mildly unsteady gait through out. Increased lateral w/shift especially L.. Little to no attension given to objects on the left  Gait Pattern: Step-through pattern;Decreased stride length  Stairs: No   ADL:  ADL  Lower Body Dressing: Performed;+1 Total assistance  Where Assessed - Lower Body Dressing: Sopported sit to stand  Toilet Transfer: Simulated;+2 Total assistance  Toilet Transfer Method: Sit to stand  ADL Comments: Pt lethargic therefore difficult to fully test sensation, vision and fine motor skills. However, pt demonstrates deficits related to processing/attention and possibly vision. Unsure of fine motor and sensation  Cognition:  Cognition  Overall Cognitive Status: Impaired  Arousal/Alertness: Awake/alert (lethargic initially but increase alertness with mobility)  Orientation Level: Oriented to person  Attention: Focused;Sustained  Focused Attention: Impaired  Focused Attention Impairment: Verbal basic;Functional basic  Sustained Attention: Impaired  Sustained Attention Impairment: Verbal  basic;Functional basic  Memory: Impaired  Memory Impairment: Retrieval deficit;Decreased recall of new information;Decreased long term memory;Decreased short term memory  Decreased Long Term Memory: Verbal basic;Functional basic  Decreased Short Term Memory: Verbal basic;Functional basic  Awareness: Impaired  Awareness Impairment: Intellectual impairment;Emergent impairment;Anticipatory impairment  Problem Solving: Impaired  Problem Solving Impairment: Verbal basic;Functional basic  Executive Function: Sequencing;Organizing;Decision Making;Initiating;Self Monitoring;Self Correcting;Reasoning  Reasoning: Impaired  Reasoning Impairment: Verbal basic;Functional basic  Sequencing: Appears intact  Sequencing Impairment: Functional basic;Verbal basic  Organizing: Impaired  Organizing Impairment: Verbal basic;Functional basic  Decision Making: Impaired  Decision Making Impairment: Verbal basic;Functional basic  Initiating: Impaired  Initiating Impairment: Verbal basic;Functional basic  Self Monitoring: Impaired  Self Monitoring Impairment: Verbal basic;Functional basic  Self Correcting: Impaired  Behaviors: Other (comment)  Safety/Judgment: Other (comment) (flat affect)  Cognition  Overall Cognitive Status: Impaired  Area of Impairment: Attention  Arousal/Alertness: Awake/alert (lethargic initially but increase alertness with mobility)  Orientation Level: Disoriented to;Place;Time;Situation  Behavior During Session: [facility] for tasks performed  Current Attention Level: Sustained  Cognition -  Other Comments: Pt with increase lethargy at end of session making it difficult to complete vision assessment  Blood pressure 113/93, pulse 63, temperature 97.7 F (36.5 C), temperature source Axillary, resp. rate 16, height 6\' 1"  (1.854 m), weight 83.8 kg (184 lb 11.9 oz), SpO2 93.00%.  Physical Exam  Constitutional: He appears well-nourished.  HENT: Dentition fair, PERRL, EOMI, Head: Normocephalic  and atraumatic.  Right Ear: External ear normal.  Left Ear: External ear normal.  Nose: Nose normal.  Mouth/Throat: Oropharynx is clear and moist.  Eyes: Conjunctivae are normal. Pupils are equal, round, and reactive to light.  Neck: Normal range of motion. Neck supple. No thyromegaly present.  Cardiovascular:  Cardiac rate controlled with regular rhythm Pulmonary/Chest: Breath sounds normal. No respiratory distress. No wheezes rales or rhonchi Abdominal: He exhibits no distension. There is no tenderness.  Musculoskeletal: He exhibits no edema.  Neurological: He is alert.  Reflex Scores:  Tricep reflexes are 1+ on the right side and 1+ on the left side.  Bicep reflexes are 1+ on the right side and 1+ on the left side.  Brachioradialis reflexes are 1+ on the right side and 1+ on the left side.  Patellar reflexes are 1+ on the right side and 1+ on the left side.  Achilles reflexes are 1+ on the right side and 1+ on the left side. Patient needed ongoing cues to name place situation, he could not recall his living situation or any family members. He followed simple commands. Limited insight and awareness. Poor attention and delayed processing. 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 Decreased attention to the left as well.   Left upper grossly 3+ to 4/5 and LLE 4/5. He's 4+ on the right. No gross sensory deficits  Skin: Skin is warm and dry except for small abrasion on the right face   Results for orders placed during the hospital encounter of 11/18/11 (from the past 48 hour(s))   PREPARE FRESH FROZEN PLASMA Status: Normal    Collection Time    11/20/11 11:30 AM   Component  Value  Range  Comment    Unit Number  [id number]      Blood Component Type  THAWED PLASMA      Unit division  00      Status of Unit  REL FROM [facility]      Transfusion Status  OK TO TRANSFUSE      Unit Number  [id number]      Blood Component Type  THAWED PLASMA      Unit division  00      Status of Unit  REL  FROM [facility]      Transfusion Status  OK TO TRANSFUSE     MRSA PCR SCREENING Status: Normal    Collection Time    11/20/11 12:15 PM   Component  Value  Range  Comment    MRSA by PCR  NEGATIVE  NEGATIVE    PROTIME-INR Status: Abnormal    Collection Time    11/20/11 5:52 PM   Component  Value  Range  Comment    Prothrombin Time  28.3 (*)  11.6 - 15.2 (seconds)     INR  2.60 (*)  0.00 - 1.49    PROTIME-INR Status: Abnormal    Collection Time    11/20/11 9:21 PM   Component  Value  Range  Comment    Prothrombin Time  27.7 (*)  11.6 - 15.2 (seconds)     INR  2.53 (*)  0.00 - 1.49    GLUCOSE, CAPILLARY Status: Abnormal    Collection Time    11/21/11 12:53 AM   Component  Value  Range  Comment    Glucose-Capillary  103 (*)  70 - 99 (mg/dL)    PROTIME-INR Status: Abnormal    Collection Time    11/21/11 4:05 AM   Component  Value  Range  Comment    Prothrombin Time  27.5 (*)  11.6 - 15.2 (seconds)     INR  2.51 (*)  0.00 - 1.49    CBC Status: Abnormal    Collection Time    11/21/11 4:05 AM   Component  Value  Range  Comment    WBC  23.7 (*)  4.0 - 10.5 (K/uL)     RBC  3.56 (*)  4.22 - 5.81 (MIL/uL)     Hemoglobin  11.4 (*)  13.0 - 17.0 (g/dL)     HCT  16.1 (*)  09.6 - 52.0 (%)     MCV  93.8  78.0 - 100.0 (fL)     MCH  32.0  26.0 - 34.0 (pg)     MCHC  34.1  30.0 - 36.0 (g/dL)     RDW  04.5 (*)  40.9 - 15.5 (%)     Platelets  489 (*)  150 - 400 (K/uL)    DIFFERENTIAL Status: Abnormal    Collection Time    11/21/11 4:05 AM   Component  Value  Range  Comment    Neutrophils Relative  73  43 - 77 (%)     Lymphocytes Relative  8 (*)  12 - 46 (%)     Monocytes Relative  17 (*)  3 - 12 (%)     Eosinophils Relative  1  0 - 5 (%)     Basophils Relative  1  0 - 1 (%)     Neutro Abs  17.4 (*)  1.7 - 7.7 (K/uL)     Lymphs Abs  1.9  0.7 - 4.0 (K/uL)     Monocytes Absolute  4.0 (*)  0.1 - 1.0 (K/uL)     Eosinophils Absolute  0.2  0.0 - 0.7 (K/uL)     Basophils Absolute  0.2 (*)  0.0 - 0.1 (K/uL)      RBC Morphology  RARE NRBCs      Smear Review  LARGE PLATELETS PRESENT     LACTATE DEHYDROGENASE Status: Abnormal    Collection Time    11/21/11 4:05 AM   Component  Value  Range  Comment    LDH  457 (*)  94 - 250 (U/L)    GLUCOSE, CAPILLARY Status: Abnormal    Collection Time    11/21/11 8:14 AM   Component  Value  Range  Comment    Glucose-Capillary  107 (*)  70 - 99 (mg/dL)    GLUCOSE, CAPILLARY Status: Normal    Collection Time    11/21/11 11:44 AM   Component  Value  Range  Comment    Glucose-Capillary  98  70 - 99 (mg/dL)    PROTIME-INR Status: Abnormal    Collection Time    11/22/11 3:53 AM   Component  Value  Range  Comment    Prothrombin Time  25.4 (*)  11.6 - 15.2 (seconds)     INR  2.27 (*)  0.00 - 1.49    CBC Status: Abnormal    Collection Time    11/22/11 3:53 AM  Component  Value  Range  Comment    WBC  19.5 (*)  4.0 - 10.5 (K/uL)     RBC  3.76 (*)  4.22 - 5.81 (MIL/uL)     Hemoglobin  12.1 (*)  13.0 - 17.0 (g/dL)     HCT  41.3 (*)  24.4 - 52.0 (%)     MCV  93.6  78.0 - 100.0 (fL)     MCH  32.2  26.0 - 34.0 (pg)     MCHC  34.4  30.0 - 36.0 (g/dL)     RDW  01.0 (*)  27.2 - 15.5 (%)     Platelets  500 (*)  150 - 400 (K/uL)     Ct Head Wo Contrast  11/22/2011 *RADIOLOGY REPORT* Clinical Data: Follow-up hemorrhage. CT HEAD WITHOUT CONTRAST Technique: Contiguous axial images were obtained from the base of the skull through the vertex without contrast. Comparison: 11/20/2011. Findings: The small hemorrhagic infarct in the right posterior parietal/occipital area is stable. There are multiple other small infarcts in the cerebellum and right and left occipital lobes. Findings most likely due to an embolic event. No hemispheric infarction. IMPRESSION: 1. Multiple small cerebellar and occipital lobe infarcts. 2. Stable small hemorrhagic infarct in the right posterior parietal/occipital area. Original Report Authenticated By: P. Loralie Champagne, M.D.  Ct Head Wo Contrast  11/21/2011  *RADIOLOGY REPORT* Clinical Data: Follow-up intracranial hemorrhage. CT HEAD WITHOUT CONTRAST Technique: Contiguous axial images were obtained from the base of the skull through the vertex without contrast. Comparison: 11/20/2011 at 0925 hours. Findings: Slight interval retraction of the small hematoma in the posterior parietal region on the right. Minimal surrounding edema is stable. Vague area of low-attenuation in the right occipital lobe could be a cortical infarct. No hemorrhage. Stable atrophy and ventriculomegaly. No findings for hemispheric infarction or other areas of hemorrhage. IMPRESSION: 1. Slight retraction of small hematoma in the right posterior parietal region. 2. Suspect a subtle cortical right occipital infarct without hemorrhage. Original Report Authenticated By: P. Loralie Champagne, M.D.   Post Admission Physician Evaluation:  1. Functional deficits secondary to right parietal infarct (with hemorrhagic transformation) and occipital infarct both related to embolic event (afib). 2. Patient is admitted to receive collaborative, interdisciplinary care between the physiatrist, rehab nursing staff, and therapy team. 3. Patient's level of medical complexity and substantial therapy needs in context of that medical necessity cannot be provided at a lesser intensity of care such as a SNF. 4. Patient has experienced substantial functional loss from his/her baseline which was documented above under the "Functional History" and "Functional Status" headings. Judging by the patient's diagnosis, physical exam, and functional history, the patient has potential for functional progress which will result in measurable gains while on inpatient rehab. These gains will be of substantial and practical use upon discharge in facilitating mobility and self-care at the household level. 5. Physiatrist will provide 24 hour management of medical needs as well as oversight of the therapy plan/treatment and provide  guidance as appropriate regarding the interaction of the two. 6. 24 hour rehab nursing will assist with bladder management, bowel management, safety, skin/wound care, disease management, medication administration, pain management and patient education and help integrate therapy concepts, techniques,education, etc. 7. PT will assess and treat for: strength, NMR, CPT, visuospatial awareness, balance, AET. Goals are: mod I to supervision. 8. OT will assess and treat for: UES, ROM, ADL's, safety, NMR, CPT, visuospatial awareness, fxnl mobility. Goals are: Mod I to min assist 9. SLP will  assess and treat for: cognition, communication. Goals are: supervision. 10. Case Management and Social Worker will assess and treat for psychological issues and discharge planning. 11. Team conference will be held weekly to assess progress toward goals and to determine barriers to discharge. 12. Patient will receive at least 3 hours of therapy per day at least 5 days per week. 13. ELOS: 7-10 days Prognosis: excellent Medical Problem List and Plan:  1. 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 DVT Prophylaxis/Anticoagulation: SCDs. Monitor for any signs of DVT  3. Leukocytosis: Latest WBC of 19,500. Followup per hematology services. Workup thus far unremarkable felt to be reactive in nature. Followup CBC in a.m.  4. Atrial fibrillation/pacemaker. Coumadin discontinued secondary hemorrhagic infarct. Plan to begin aspirin therapy once INR less than 2.00. Cardiac rate is controlled  5. Dementia. Continue Aricept and Namenda. Speech therapy to provide evaluation. Will discuss baseline cognition with wife  6. History BPH. Check PVRs x3. Latest urine study showed no growth  Ivory Broad, MD

## 2011-11-22 NOTE — Discharge Summary (Signed)
NAMEBRENTLY, [name]                ACCOUNT NO.:  [id number]  MEDICAL RECORD NO.:  [id number]  LOCATION:  3114                         FACILITY:  [facility]  PHYSICIAN:  Rosalyn Gess. Yemariam Magar, MD  DATE OF BIRTH:  [birth year]  DATE OF ADMISSION:  11/18/2011 DATE OF DISCHARGE:  11/22/2011                              DISCHARGE SUMMARY   ADMITTING DIAGNOSES: 1. Cerebrovascular accident. 2. Atrial fibrillation, rate controlled. 3. Leukocytosis. 4. Hypertension, controlled.  DISCHARGE DIAGNOSES: 1. Cerebrovascular accident. 2. Atrial fibrillation, rate controlled. 3. Leukocytosis. 4. Hypertension, controlled.  CONSULTANTS:  Dr. Pearlean Brownie for the stroke team, Dr. Arbutus Ped for Hematology, Dr. Faith Rogue for Inpatient Rehab.  PROCEDURES: 1. CT of the brain, November 18, 2011, with no acute intracranial     abnormality noted. 2. Chest x-ray date of admission which showed shallow inspiration.  No     evidence of acute pulmonary disease. 3. CT of the brain November 20, 2011, and followup which showed a new 7 x     10 mm hemorrhagic focus right parietal region with mild surrounding     edema, not previously present.  Findings likely represent     hemorrhagic infarct related to embolus.  Suspect acute     nonhemorrhagic right occipital infarct not visualized previously. 4. Followup CT brain in the p.m. on November 20, 2011, which shows slight     retraction of small hematoma in the right posterior parietal     region.  Suspect a subcortical right occipital infarct without     hemorrhage. 5. CT of the brain November 22, 2011, which showed multiple small     cerebellar occipital lobe infarcts.  Stable small hemorrhagic     infarct in the right posterior parietal occipital region.  HISTORY OF PRESENT ILLNESS:  Mr. [name] is a delightful 78 year old gentleman followed for atrial fibrillation, hypertension, and mild memory loss/cognitive impairment.  He was seen in the office on Friday, Nov 17, 2011, when he presented  with changes in sensation in his right hand that has been intermittent.  His examination at that time was nonfocal.  He was already fully anticoagulated on Coumadin.  He was felt to be stable and was discharged from the office to home to continue his present medical regimen.  The patient presented to the emergency department on the day of admission, November 18, 2011, because of increasing problems with clumsiness, loss of sensation in his hand, mild slurred speech and cognitive change. CT scan of the brain was performed with no significant changes, it was felt he was having TIA that was consistent with possible thrombotic event given the slow onset of symptoms and gradual progression.  He was admitted to [facility] unit and stroke team had been rallied at the time of his admission.  He was not a candidate for MRI because of the pacemaker. He had had carotid Dopplers in January which did not need to be repeated.  He was a candidate for 2D echo, which was unchanged from previous studies with no significant change, no obvious thrombus.  The patient had a significant decline in the afternoon of November 19, 2011, with problems with increasing somnolence, left facial droop, difficulty with  speech.  On Monday morning, November 20, 2011, he definitely had  left facial droop with flattening of the left nasolabial fold.  He was very somnolent.  He had extreme difficulty with expression.  A repeat CT scan was performed, which did reveal a small hemorrhage.  Coumadin was stopped and the patient was transferred to the neuro ICU.  He was considered for a reversal of coagulation protocol, however, this was delayed given his improving symptoms and very small hemorrhage. Followup CT scan on the p.m. of November 20, 2011, revealed no significant change in the hemorrhage, and therefore reversal of coagulation was not felt indicated.  The patient continued to improve regaining clarity of speech, less somnolence and resolution  of left facial droop.  He was seen in evaluation by [facility] who felt he was a good candidate for their services.  At the time of this dictation, we were waiting insurance approval for transfer to [facility].  The patient's symptoms have definitely improved.  Hematology.  At the time of admission, the patient developed an elevated white blood count of 17,700.  There was no foci of infection with normal chest x-ray, negative urinalysis.  No fever.  Differential on his white count was normal.  On November 20, 2011, the patient's white count jumped to 26,600.  At that point, empiric antibiotics were started using Rocephin initially with 2 g loading dose, then 1 g q. 24.  Hematology was consulted in regards to any underlying hematologic abnormality.  Blood smear was reviewed by Dr. Arbutus Ped in pathology.  Initial impression from consultant was this was an inflammatory response versus infection.  No additional treatment or diagnostic testing was felt indicated.  The patient's white count continued to remain elevated was trending down, at the time of this dictation was 19,900.  Our plan is to continue IV antibiotics for a full 5 day course and to continue to monitor his white blood count.  The patient did have a mild anemia.  Iron studies were performed which revealed an iron saturation of 16% with a total iron of 49.  The patient had no obvious signs of blood loss.  The patient's hemoglobin did improve.  On the day of this dictation, hemoglobin was up to 12.1 g.  With the patient being stable with his white count trending down, with his hemoglobin being stable, at this point, no further hematologic intervention is required.  Cardiac.  The patient has chronic stable atrial fibrillation.  He has remained hemodynamically stable throughout his admission.  His rate control was been excellent on his present medications.  The patient's blood pressure has also been stable.  Plan  is to continue his home Medications except to stop warfarin and treat with ASA alone.  In summary, this is a very pleasant gentleman who was admitted with a stroke question of embolic versus thrombotic.  He had small Hemorrhage noted which has been stable.  Overall, he has done well.  FINAL DISPOSITION:  Waiting for inpatient rehab.  If this does not improve, the patient would be a candidate to return home with home health.     Rosalyn Gess Shantinique Picazo, MD     MEN/MEDQ  D:  11/22/2011  T:  11/22/2011  Job:  [id number]  cc:   Pramod P. Pearlean Brownie, MD Lajuana Matte, M.D.

## 2011-11-22 NOTE — Progress Notes (Signed)
Stroke Team Progress Note  HISTORY [name] is an 78 y.o. male history of atrial fibrillation on Coumadin, cardiac pacemaker, previous TIA and dementia, presenting with acute onset of visual difficulty on the left side as well as weakness and numbness involving his left extremities starting at 5:30 PM 11/18/2011. INR was 3.02. CT scan of his head showed no acute intracranial abnormality. NIH stroke score was 4. Patient's visual difficulty on the left began to resolve while in the emergency room. Repeat NIH score was 3. Patient was not a candidate for thrombolytic therapy because of his therapeutic INR. He was admitted for further evaluation and treatment.  SUBJECTIVE No complaints. Denies HA or pain. RN at bedside. No new events. Discussed with pts wife at bedside.  OBJECTIVE Most recent Vital Signs: Filed Vitals:   11/22/11 0400 11/22/11 0500 11/22/11 0600 11/22/11 0700  BP: 124/70 115/67 117/65 131/68  Pulse: 52 68 61 66  Temp: 96.4 F (35.8 C)   97.7 F (36.5 C)  TempSrc: Oral   Axillary  Resp: 17 17 16 20   Height:      Weight:      SpO2: 96% 95% 96% 96%   CBG (last 3)   Basename 11/21/11 1144 11/21/11 0814 11/21/11 0053  GLUCAP 98 107* 103*   Intake/Output from previous day: 06/04 0701 - 06/05 0700 In: 600 [P.O.:550; IV Piggyback:50] Out: 545 [Urine:545] IV Fluid Intake:     MEDICATIONS     . atenolol  50 mg Oral Daily  . cefTRIAXone (ROCEPHIN)  IV  1 g Intravenous Q24H  . donepezil  10 mg Oral QHS  . memantine  10 mg Oral BID   PRN:    Diet:  Dysphagia 3 thin liquids Activity:  Bedrest DVT Prophylaxis:  SCDs   CLINICALLY SIGNIFICANT STUDIES Basic Metabolic Panel:   Lab 11/20/11 0600 11/19/11 0525  NA 141 143  K 4.0 3.8  CL 102 107  CO2 26 24  GLUCOSE 90 83  BUN 15 19  CREATININE 0.96 0.88  CALCIUM 9.3 9.2  MG -- --  PHOS -- --   Liver Function Tests:   Lab 11/19/11 0525 11/18/11 1822  AST 23 26  ALT 13 14  ALKPHOS 59 65  BILITOT 1.7* 1.5*    PROT 6.0 6.8  ALBUMIN 3.8 4.1   CBC:   Lab 11/22/11 0353 11/21/11 0405 11/19/11 0525  WBC 19.5* 23.7* --  NEUTROABS -- 17.4* 13.7*  HGB 12.1* 11.4* --  HCT 35.2* 33.4* --  MCV 93.6 93.8 --  PLT 500* 489* --   Coagulation:   Lab 11/22/11 0353 11/21/11 0405 11/20/11 2121 11/20/11 1752  LABPROT 25.4* 27.5* 27.7* 28.3*  INR 2.27* 2.51* 2.53* 2.60*   Cardiac Enzymes:   Lab 11/18/11 1822  CKTOTAL 80  CKMB 2.8  CKMBINDEX --  TROPONINI <0.30   Urinalysis:   Lab 11/18/11 2131  COLORURINE AMBER*  LABSPEC 1.025  PHURINE 5.0  GLUCOSEU NEGATIVE  HGBUR NEGATIVE  BILIRUBINUR SMALL*  KETONESUR 15*  PROTEINUR NEGATIVE  UROBILINOGEN 1.0  NITRITE NEGATIVE  LEUKOCYTESUR TRACE*   Lipid Panel    Component Value Date/Time   CHOL 116 11/19/2011 0525   TRIG 58 11/19/2011 0525   HDL 47 11/19/2011 0525   CHOLHDL 2.5 11/19/2011 0525   VLDL 12 11/19/2011 0525   LDLCALC 57 11/19/2011 0525   HgbA1C  Lab Results  Component Value Date   HGBA1C 5.7* 11/18/2011    Urine Drug Screen:     Component  Value Date/Time   LABOPIA NONE DETECTED 11/18/2011 2155   COCAINSCRNUR NONE DETECTED 11/18/2011 2155   LABBENZ NONE DETECTED 11/18/2011 2155   AMPHETMU NONE DETECTED 11/18/2011 2155   THCU NONE DETECTED 11/18/2011 2155   LABBARB NONE DETECTED 11/18/2011 2155    Thyroid Function Tests:   Lab 11/19/11 0525  TSH 4.260  T4TOTAL --  FREET4 --  T3FREE --  THYROIDAB --   Anemia Panel:   Lab 11/19/11 1340  VITAMINB12 585  FOLATE 17.0  FERRITIN 1006*  TIBC 242  IRON 39*  RETICCTPCT 3.2*   CT of the brain (reviewed myself) 11/22/11 @3 :48am - stable right parieto-occipital ICH (6-89mm). Multi-focal chronic ischemic infarctions (suspect embolic). Moderate atrophy. Moderate small vessel ischemic disease. 11/21/2011   1.  Slight retraction of small hematoma in the right posterior parietal region. 2.  Suspect a subtle cortical right occipital infarct without hemorrhage.  11/20/2011   New 7 x 10 mm hemorrhagic focus  right parietal region with mild surrounding edema was not present previously.  Findings likely represent a hemorrhagic infarct related to an embolus.  Suspected acute non hemorrhagic right occipital infarct not visualized previously.  Correlate with history of visual difficulty.  Critical Value/emergent results were called by telephone at the time of interpretation on 11/20/2011  at 9:35 a.m.  to  Dr. Debby Bud, who verbally acknowledged these results.  11/18/2011  No acute intracranial abnormality.  MRI of the brain  Cannot due to pacemaker  MRA of the brain  Cannot due to pacemaker  2D Echocardiogram  EF 40-45% with no source of embolus. consistent with moderate pulmonary hypertension.  Carotid Doppler  07/09/2011 No internal carotid artery stenosis bilaterally. Vertebrals with antegrade flow bilaterally.   CXR  11/18/2011  Shallow inspiration.  No evidence of active pulmonary disease.    EKG  Ventricular paced rhythm.   Therapy Recommendations PT -CIR, OT -CIR  Physical Exam pleasant elderly Caucasian male currently not in distress. Awake alert. Afebrile. Head is nontraumatic. Neck is supple without bruit. Hearing is normal. Cardiac exam no murmur or gallop, regular rhythm. Lungs are clear to auscultation.  Neurological Exam : awake alert. CANNOT TELL ME MONTH. SAYS HIS AGE IS "30'S". CANNOT NAME PEN. Diminished attention, short-term memory, registration and recall. Follows only a few simple one and occasional two-step commands. Extraocular moments are full range without nystagmus. Does not blink to threat on the left. LEFT HEMI-FIELD CUT. Not cooperative for detailed visual field testing exam. There is minimum left lower facial symmetry. Tongue is midline. Motor system exam is NOTABLE FOR LEFT ARM DRIFT. RUE AND BLE FULL STRENGTH. Sensation REDUCED ON LEFT ARM. Gait was not tested.  ASSESSMENT [name] is a 78 y.o. male with an ischemic infarct on arrival in setting of therapeutic INR, now  with new 7 x 10 mm hemorrhagic focus right parietal region with mild surrounding edema. Hemorrhagic transformation likely secondary to therapeutic INR vs amyloid angiopathy. On warfarin prior to admission, INR 3.03, for atrial fibrillation.  -baseline dementia -pacemaker -atrial fibrillation, on chronic coumadin -hypertension -TIA 06/2011  Hospital day # 4  TREATMENT/PLAN -warfarin on hold due to right parieto-occipital ICH -If there is neurological worsening or increase hemorrhage size may need to reverse with anticoagulation reversal protocol. -start ASA when INR < 2.0 -OOB. Plan for CIR if insurance approves.  Joycelyn Schmid, MD [facility] 11/22/2011 8:52 AM

## 2011-11-22 NOTE — Progress Notes (Signed)
Discussed CIR with patient's wife and patient at bedside. Waiting on insurance authorization for CIR. Would like to bring patient to CIR pending insurance and medical readiness. Marina Goodell adm coordinator [phone].

## 2011-11-22 NOTE — Progress Notes (Signed)
Physical Therapy Treatment Patient Details Name: [name] MRN: [id number] DOB: May 16, 1929 Today's Date: 11/22/2011 Time: 4098-1191 PT Time Calculation (min): 29 min  PT Assessment / Plan / Recommendation Comments on Treatment Session  Pt able to improve ambulation using RW with occasional min (A) for RW management.  Pt with decrease attention to the left side especially with ambulation.    Follow Up Recommendations  Inpatient Rehab    Barriers to Discharge        Equipment Recommendations  Defer to next venue    Recommendations for Other Services    Frequency Min 4X/week   Plan Discharge plan remains appropriate    Precautions / Restrictions Precautions Precautions: Fall Precaution Comments: some mild dementia Restrictions Weight Bearing Restrictions: No   Pertinent Vitals/Pain No c/o pain    Mobility  Bed Mobility Bed Mobility: Supine to Sit;Sitting - Scoot to Edge of Bed Supine to Sit: 4: Min guard Sitting - Scoot to [place] of Bed: 4: Min assist Details for Bed Mobility Assistance: (A) to scoot hips to EOB and needs extra time to process information and technique to scoot Transfers Transfers: Sit to Stand;Stand to Sit Sit to Stand: 4: Min assist;From bed Stand to Sit: 4: Min assist;To chair/3-in-1 Details for Transfer Assistance: max cues for technique and hand placement;  Ambulation/Gait Ambulation/Gait Assistance: 4: Min assist Ambulation Distance (Feet): 150 Feet Assistive device: Rolling walker Ambulation/Gait Assistance Details: Cues for body position within RW, RW management and upright posture.  Pt needs redirection to task and constant reminder of room number.  Pt unable to navigate using mapping task with max cues.  Pt with little attention to left side and unware of objects on the left. Gait Pattern: Step-through pattern;Decreased stride length Modified Rankin (Stroke Patients Only) Pre-Morbid Rankin Score: No symptoms Modified Rankin: Moderately severe  disability    Exercises     PT Diagnosis:    PT Problem List:   PT Treatment Interventions:     PT Goals Acute Rehab PT Goals PT Goal Formulation: With patient/family Time For Goal Achievement: 12/04/11 Potential to Achieve Goals: Good Pt will go Supine/Side to Sit: with modified independence PT Goal: Supine/Side to Sit - Progress: Progressing toward goal Pt will go Sit to Stand: with supervision PT Goal: Sit to Stand - Progress: Progressing toward goal Pt will go Stand to Sit: with supervision PT Goal: Stand to Sit - Progress: Progressing toward goal Pt will Stand: with supervision;3 - 5 min PT Goal: Stand - Progress: Progressing toward goal Pt will Ambulate: >150 feet;with min assist;with rolling walker PT Goal: Ambulate - Progress: Met  Visit Information  Last PT Received On: 11/22/11 Assistance Needed: +1    Subjective Data      Cognition  Overall Cognitive Status: Impaired Area of Impairment: Attention Arousal/Alertness: Awake/alert Orientation Level: Disoriented to;Place;Time;Situation Behavior During Session: [facility] for tasks performed Current Attention Level: Selective Cognition - Other Comments: Pt slow to process and short term memory deficits.  Pt unable to recall room number ~45 seconds after being told.    Balance  Balance Balance Assessed: Yes Static Sitting Balance Static Sitting - Balance Support: Feet supported Static Sitting - Level of Assistance: 5: Stand by assistance  End of Session PT - End of Session Equipment Utilized During Treatment: Gait belt Activity Tolerance: Patient tolerated treatment well Patient left: in chair;with call bell/phone within reach;with family/visitor present Nurse Communication: Mobility status    Bryen Hinderman 11/22/2011, 1:16 PM Jake Shark, PT DPT [phone]

## 2011-11-23 DIAGNOSIS — I6330 Cerebral infarction due to thrombosis of unspecified cerebral artery: Secondary | ICD-10-CM

## 2011-11-23 DIAGNOSIS — Z5189 Encounter for other specified aftercare: Secondary | ICD-10-CM

## 2011-11-23 LAB — DIFFERENTIAL
Band Neutrophils: 0 % (ref 0–10)
Basophils Absolute: 0 10*3/uL (ref 0.0–0.1)
Basophils Relative: 0 % (ref 0–1)
Blasts: 0 %
Lymphocytes Relative: 15 % (ref 12–46)
Metamyelocytes Relative: 0 %
Myelocytes: 0 %
Promyelocytes Absolute: 0 %

## 2011-11-23 LAB — CBC
MCV: 94.3 fL (ref 78.0–100.0)
Platelets: 519 10*3/uL — ABNORMAL HIGH (ref 150–400)
RBC: 3.69 MIL/uL — ABNORMAL LOW (ref 4.22–5.81)
WBC: 15.8 10*3/uL — ABNORMAL HIGH (ref 4.0–10.5)

## 2011-11-23 LAB — COMPREHENSIVE METABOLIC PANEL
ALT: 12 U/L (ref 0–53)
AST: 22 U/L (ref 0–37)
Albumin: 3.6 g/dL (ref 3.5–5.2)
Alkaline Phosphatase: 60 U/L (ref 39–117)
CO2: 27 mEq/L (ref 19–32)
Chloride: 102 mEq/L (ref 96–112)
Creatinine, Ser: 0.98 mg/dL (ref 0.50–1.35)
GFR calc non Af Amer: 75 mL/min — ABNORMAL LOW (ref >90)
Potassium: 3.5 mEq/L (ref 3.5–5.1)
Total Bilirubin: 1.5 mg/dL — ABNORMAL HIGH (ref 0.3–1.2)

## 2011-11-23 LAB — PROTIME-INR
INR: 1.81 — ABNORMAL HIGH (ref 0.00–1.49)
Prothrombin Time: 21.3 seconds — ABNORMAL HIGH (ref 11.6–15.2)

## 2011-11-23 MED ORDER — ASPIRIN 81 MG PO CHEW
81.0000 mg | CHEWABLE_TABLET | Freq: Every day | ORAL | Status: DC
  Administered 2011-11-23 – 2011-12-01 (×9): 81 mg via ORAL
  Filled 2011-11-23 (×9): qty 1

## 2011-11-23 NOTE — Progress Notes (Signed)
Patient information reviewed and entered into [employer] system by Viona Hosking, RN, CRRN, PPS Coordinator.  Information including medical coding and functional independence measure will be reviewed and updated through discharge.     Per nursing patient was given "Data Collection Information Summary for Patients in Inpatient Rehabilitation Facilities with attached "Privacy Act Statement-Health Care Records" upon admission.   

## 2011-11-23 NOTE — Care Management (Signed)
Inpatient Rehabilitation Center Individual Statement of Services  Patient Name:  [name]  Date:  11/23/2011  Welcome to the Inpatient Rehabilitation Center.  Our goal is to provide you with an individualized program based on your diagnosis and situation, designed to meet your specific needs.  With this comprehensive rehabilitation program, you will be expected to participate in at least 3 hours of rehabilitation therapies Monday-Friday, with modified therapy programming on the weekends.  Your rehabilitation program will include the following services:  Physical Therapy (PT), Occupational Therapy (OT), Speech Therapy (ST), 24 hour per day rehabilitation nursing, Therapeutic Recreaction (TR), Case Management (RN and Child psychotherapist), Rehabilitation Medicine, Nutrition Services and Pharmacy Services  Weekly team conferences will be held on  Wednesday to discuss your progress.  Your RN Case Designer, television/film set will talk with you frequently to get your input and to update you on team discussions.  Team conferences with you and your family in attendance may also be held.  Expected length of stay: about 10 days                                                                                                         Overall anticipated outcome: Supervision-Min Assist  Depending on your progress and recovery, your program may change.  Your RN Case Estate agent will coordinate services and will keep you informed of any changes.  Your RN Sports coach and SW names and contact numbers are listed  below.  The following services may also be recommended but are not provided by the Inpatient Rehabilitation Center:   Driving Evaluations  Home Health Rehabiltiation Services  Outpatient Rehabilitatation [facility]  Vocational Rehabilitation   Arrangements will be made to provide these services after discharge if needed.  Arrangements include referral to agencies that provide these  services.  Your insurance has been verified to be:  [place] Your primary doctor is:  Dr. Illene Regulus  Pertinent information will be shared with your doctor and your insurance company.  Case Manager: Melanee Spry, [facility] [phone]  Social Worker:  Dossie Der, [place] [phone]  Information discussed with and copy given to patient and wife by: Brock Ra, 11/23/2011, 12:29 PM

## 2011-11-23 NOTE — Evaluation (Signed)
Occupational Therapy Assessment and Plan  Patient Details  Name: [name] MRN: [id number] Date of Birth: [birth year]  OT Diagnosis: altered mental status, apraxia, ataxia, cognitive deficits, disturbance of vision, hemiplegia affecting non-dominant side and muscle weakness (generalized) Rehab Potential: Rehab Potential: Good ELOS: ~ 10 days   Today's Date: 11/23/2011 Time: 4098-1191 Time Calculation (min): 62 min  Problem List:  Patient Active Problem List  Diagnoses  . POLYP, COLON  . HYPERLIPIDEMIA  . Dementia  . HYPERTENSION  . Atrial fibrillation  . BRADYCARDIA  . HEMORRHOIDS  . DIVERTICULOSIS, COLON  . LEG PAIN, LEFT  . OTHER ABNORMAL BLOOD CHEMISTRY  . BENIGN PROSTATIC HYPERTROPHY, HX OF  . PACEMAKER, PERMANENT  . ADENOCARCINOMA, PROSTATE, GLEASON GRADE 4  . Hyperglycemia  . Numbness and tingling in left hand  . TIA (transient ischemic attack)  . CVA (cerebrovascular accident due to intracerebral hemorrhage)    Past Medical History:  Past Medical History  Diagnosis Date  . Malignant neoplasm of prostate   . Unspecified psychosis   . Cardiac pacemaker [employer]   . Atrial fibrillation   . Pain in limb   . Encounter for long-term (current) use of other medications   . Unspecified essential hypertension   . BPH (benign prostatic hypertrophy)   . Other and unspecified hyperlipidemia   . Other acquired absence of organ   . Unspecified hemorrhoids without mention of complication   . Diverticulosis of colon (without mention of hemorrhage)   . History of vein stripping   . Thrombocytosis 07/09/2011  . Normocytic anemia 07/09/2011  . TIA (transient ischemic attack) 07/09/2011  . Benign neoplasm of colon    Past Surgical History:  Past Surgical History  Procedure Date  . Pacemaker placement 1999, replaced 2008    hx tachy-brady  . Vein stripping laser   . Tonsillectomy and adenoidectomy     Assessment & Plan Clinical Impression: Patient is a 78  y.o. year old male with recent admission to the hospital on 11/18/2011 with left-sided weakness and numbness and left-sided visual difficulty as well as altered mental status. Patient independent prior to admission per wife and driving. Cranial CT scan 11/18/2011 showed no acute intracranial abnormalities. His INR on admission was 3.02.  Echocardiogram with ejection fraction of 45% without embolus. Recent carotid Doppler study showed no ICA stenosis. A followup cranial CT scan 11/20/2011 showed new 7 x 10 mm hemorrhagic focus right parietal region with mild surrounding edema not present previously. Suspect acute nonhemorrhagic right occipital infarct not visualized previously. MRI was not done due to pacemaker.     Patient transferred to CIR on 11/22/2011 .    Patient currently requires mod with basic self-care skills secondary to impaired timing and sequencing, motor apraxia, ataxia, decreased coordination and decreased motor planning, decreased visual motor skills and Left inattention, decreased attention to left and decreased motor planning and decreased initiation, decreased attention, decreased awareness, decreased problem solving, decreased safety awareness and decreased memory.  Prior to hospitalization, patient could complete ADLs with supervision.  Patient will benefit from skilled intervention to decrease level of assist with basic self-care skills and increase independence with basic self-care skills prior to discharge home with care partner.  Anticipate patient will require 24 hour supervision and follow up home health, follow up outpatient and (TBD).  OT - End of Session Activity Tolerance: Tolerates 30+ min activity without fatigue Endurance Deficit: No OT Assessment Rehab Potential: Good OT Plan OT Frequency: 1-2 X/day, 60-90 minutes Estimated Length of  Stay: ~ 10 days OT Treatment/Interventions: Balance/vestibular training;Cognitive remediation/compensation;Discharge  planning;DME/adaptive equipment instruction;Functional mobility training;Neuromuscular re-education;Patient/family education;Psychosocial support;Self Care/advanced ADL retraining;Therapeutic Activities;Therapeutic Exercise;UE/LE Strength taining/ROM;UE/LE Coordination activities;Visual/perceptual remediation/compensation OT Recommendation Follow Up Recommendations: Home health OT;Outpatient OT (TBD) Equipment Recommended: Tub/shower seat  OT Evaluation Precautions/Restrictions  Precautions Precautions: Fall Precaution Comments: mild dementia Restrictions Weight Bearing Restrictions: No General Chart Reviewed: Yes Family/Caregiver Present: Yes (wife, Carney Bern) Vital Signs Therapy Vitals Temp: 97.9 F (36.6 C) Temp src: Oral Pulse Rate: 69  Resp: 16  BP: 100/60 mmHg Patient Position, if appropriate: Lying Oxygen Therapy SpO2: 94 % O2 Device: None (Room air) Pain Pain Assessment Pain Assessment: No/denies pain Home Living/Prior Functioning Home Living Lives With: Spouse Available Help at Discharge: Family Type of Home: House Home Access: Stairs to enter Secretary/administrator of Steps: 2 Home Layout: Two level;Able to live on main level with bedroom/bathroom Alternate Level Stairs-Number of Steps: wife reports pt does not use upstairs Bathroom Shower/Tub: Walk-in shower;Door [employer] Toilet: Pharmacist, community: Yes How Accessible: Accessible via walker Home Adaptive Equipment: None Prior Function Level of Independence: Independent with basic ADLs Able to Take Stairs?: Yes Vocation: Retired ADL ADL Grooming: Minimal assistance Where Assessed-Grooming: Sitting at sink Upper Body Bathing: Maximal cueing;Minimal assistance Where Assessed-Upper Body Bathing: Sitting at sink Lower Body Bathing: Moderate assistance;Maximal cueing Where Assessed-Lower Body Bathing: Standing at sink Upper Body Dressing: Maximal assistance;Maximal cueing Where Assessed-Upper Body  Dressing: Sitting at sink Lower Body Dressing: Moderate assistance;Moderate cueing Where Assessed-Lower Body Dressing: Sitting at sink;Standing at sink Toileting: Moderate assistance Where Assessed-Toileting: Teacher, adult education: Curator Method: Ambulating ADL Comments: Pt requires max cues for initiation and sequencing of bathing, setup with washcloth and soap, and min cues for Lt side attention Vision/Perception  Vision - History Baseline Vision: Wears glasses only for reading Patient Visual Report: No change from baseline Vision - Assessment Eye Alignment: Impaired (comment) Vision Assessment: Vision tested Tracking/Visual Pursuits: Decreased smoothness of eye movement to LEFT superior field;Requires cues, head turns, or add eye shifts to track;Impaired - to be further tested in functional context Saccades: Additional eye shifts occurred during testing;Additional head turns occurred during testing;Undershoots Perception Inattention/Neglect: Does not attend to left side of body Comments: Pt also presents with deficits with left/right discrimination, impaired proprioception on Lt  Cognition Overall Cognitive Status: Impaired Orientation Level: Oriented to person;Oriented to place Attention: Focused Focused Attention: Impaired Sensation Sensation Light Touch: Impaired by gross assessment Hot/Cold: Appears Intact Proprioception: Impaired Detail Proprioception Impaired Details: Impaired LUE;Impaired LLE Additional Comments: Pt poor report, difficult to fully assess Coordination Gross Motor Movements are Fluid and Coordinated: No Fine Motor Movements are Fluid and Coordinated: No Finger Nose Finger Test: mild dysmetria, required increased time, easily distracted Motor    Mobility  Bed Mobility Bed Mobility: Supine to Sit;Sitting - Scoot to Edge of Bed Supine to Sit: 4: Min assist Sitting - Scoot to [place] of Bed: 4: Min assist  Trunk/Postural  Assessment     Balance   Extremity/Trunk Assessment RUE Assessment RUE Assessment: Within Functional Limits (strength grossly 4/5) LUE Assessment LUE Assessment: Exceptions to [facility] (strength grossly 3+/5, WFL ROM, decreased prop reach behind)  See FIM for current functional status Refer to Care Plan for Long Term Goals  Recommendations for other services: None  Discharge Criteria: Patient will be discharged from OT if patient refuses treatment 3 consecutive times without medical reason, if treatment goals not met, if there is a change in medical status, if patient makes no progress  towards goals or if patient is discharged from hospital.  The above assessment, treatment plan, treatment alternatives and goals were discussed and mutually agreed upon: by patient and by family  Treatment Session: OT eval, ADL assessment.  Bathing at sink with max cues for initiation, sequencing, and attention to Lt body with bathing and dressing.  Pt with impaired perception most notably with donning shirt and buttoning pants.  Leonette Monarch 11/23/2011, 10:21 AM

## 2011-11-23 NOTE — Care Management Note (Signed)
    Page 1 of 1   11/23/2011     8:22:20 AM   CARE MANAGEMENT NOTE 11/23/2011  Patient:  [name], [name]   Account Number:  [id number]  Date Initiated:  11/23/2011  Documentation initiated by:  [facility]  Subjective/Objective Assessment:   Admitted with CVA     Action/Plan:   PT eval-inpatient rehab  OT eval-inpatient rehab   Anticipated DC Date:  11/22/2011   Anticipated DC Plan:  IP REHAB FACILITY      DC Planning Services  CM consult      Choice offered to / List presented to:             Status of service:  Completed, signed off Medicare Important Message given?   (If response is "NO", the following Medicare IM given date fields will be blank) Date Medicare IM given:   Date Additional Medicare IM given:    Discharge Disposition:  IP REHAB FACILITY  Per UR Regulation:  Reviewed for med. necessity/level of care/duration of stay  If discussed at Long Length of Stay Meetings, dates discussed:    Comments:

## 2011-11-23 NOTE — Evaluation (Signed)
Speech Language Pathology Assessment and Plan  Patient Details  Name: [name] MRN: [id number] Date of Birth: Mar 12, 1929  SLP Diagnosis: Cognitive Impairments;Speech and Language deficits  Rehab Potential: Good ELOS: 10 days   Today's Date: 11/23/2011 Time: 1015-1100 Time Calculation (min): 45 min  Problem List:  Patient Active Problem List  Diagnoses  . POLYP, COLON  . HYPERLIPIDEMIA  . Dementia  . HYPERTENSION  . Atrial fibrillation  . BRADYCARDIA  . HEMORRHOIDS  . DIVERTICULOSIS, COLON  . LEG PAIN, LEFT  . OTHER ABNORMAL BLOOD CHEMISTRY  . BENIGN PROSTATIC HYPERTROPHY, HX OF  . PACEMAKER, PERMANENT  . ADENOCARCINOMA, PROSTATE, GLEASON GRADE 4  . Hyperglycemia  . Numbness and tingling in left hand  . TIA (transient ischemic attack)  . CVA (cerebrovascular accident due to intracerebral hemorrhage)   Past Medical History:  Past Medical History  Diagnosis Date  . Malignant neoplasm of prostate   . Unspecified psychosis   . Cardiac pacemaker [employer]   . Atrial fibrillation   . Pain in limb   . Encounter for long-term (current) use of other medications   . Unspecified essential hypertension   . BPH (benign prostatic hypertrophy)   . Other and unspecified hyperlipidemia   . Other acquired absence of organ   . Unspecified hemorrhoids without mention of complication   . Diverticulosis of colon (without mention of hemorrhage)   . History of vein stripping   . Thrombocytosis 07/09/2011  . Normocytic anemia 07/09/2011  . TIA (transient ischemic attack) 07/09/2011  . Benign neoplasm of colon    Past Surgical History:  Past Surgical History  Procedure Date  . Pacemaker placement 1999, replaced 2008    hx tachy-brady  . Vein stripping laser   . Tonsillectomy and adenoidectomy     Assessment / Plan / Recommendation Clinical Impression  [name] is a 78 y.o. right-handed male with history of atrial fibrillation/pacemaker on chronic Coumadin therapy  as well as documented dementia and TIA 1/13 admitted 11/18/2011 with left-sided weakness and numbness and left-sided visual difficulty as well as altered mental status. Patient independent prior to admission per wife and driving. Cranial CT scan 11/18/2011 showed no acute intracranial abnormalities. His INR on admission was 3.02. A follow up cranial CT scan 11/20/2011 showed new 7 x 10 mm hemorrhagic focus right parietal region with mild surrounding edema not present previously.  Suspect acute nonhemorrhagic right occipital infarct not visualized previously. MRI was not done due to pacemaker. Patient transferred to [facility] 11/22/11 and upon evaluation presents with baseline deficits in cognition and word finding; however, per wife's report deficits in attention, processing speed and basic problem solving are more significant.  As a result, this patient would benefit from skilled SLP services to address these deficits and facilitate problem solving with basic, familiar tasks and reduce burden of care upon discharge.      SLP Assessment  Patient will need skilled Speech Lanaguage Pathology Services during CIR admission    Recommendations  Follow up Recommendations: Other (comment) (TBD) Equipment Recommended: None recommended by SLP    SLP Frequency 1-2 X/day, 30-60 minutes;5 out of 7 days   SLP Treatment/Interventions Cognitive remediation/compensation;Cueing hierarchy;Environmental controls;Functional tasks;Internal/external aids;Patient/family education;Speech/Language facilitation;Therapeutic Activities    Pain Pain Assessment Pain Assessment: No/denies pain Pain Score: 0-No pain Prior Functioning Cognitive/Linguistic Baseline: Baseline deficits Baseline deficit details: baseline dememtia with difficulty solving complex problems Type of Home: House Lives With: Spouse Available Help at Discharge: Family Education: college graduate Vocation: Retired (from  insurance business)  Short Term  Goals: Short term goals = long term goals due to length of stay  See FIM for current functional status Refer to Care Plan for Long Term Goals  Recommendations for other services: None  Discharge Criteria: Patient will be discharged from SLP if patient refuses treatment 3 consecutive times without medical reason, if treatment goals not met, if there is a change in medical status, if patient makes no progress towards goals or if patient is discharged from hospital.  The above assessment, treatment plan, treatment alternatives and goals were discussed and mutually agreed upon: by patient and by family  Charlane Ferretti., CCC-SLP [phone]  Carla Rashad 11/23/2011, 11:34 AM

## 2011-11-23 NOTE — Evaluation (Addendum)
Physical Therapy Assessment and Plan  Patient Details  Name: [name] MRN: [id number] Date of Birth: [birth year]  PT Diagnosis: Cognitive deficits, Difficulty walking, Impaired cognition and Impaired sensation Rehab Potential: Good ELOS: 10 days   Today's Date: 11/23/2011 Time: 1103-1200 Time Calculation (min): 57 min  Problem List:  Patient Active Problem List  Diagnoses  . POLYP, COLON  . HYPERLIPIDEMIA  . Dementia  . HYPERTENSION  . Atrial fibrillation  . BRADYCARDIA  . HEMORRHOIDS  . DIVERTICULOSIS, COLON  . LEG PAIN, LEFT  . OTHER ABNORMAL BLOOD CHEMISTRY  . BENIGN PROSTATIC HYPERTROPHY, HX OF  . PACEMAKER, PERMANENT  . ADENOCARCINOMA, PROSTATE, GLEASON GRADE 4  . Hyperglycemia  . Numbness and tingling in left hand  . TIA (transient ischemic attack)  . CVA (cerebrovascular accident due to intracerebral hemorrhage)    Past Medical History:  Past Medical History  Diagnosis Date  . Malignant neoplasm of prostate   . Unspecified psychosis   . Cardiac pacemaker [employer]   . Atrial fibrillation   . Pain in limb   . Encounter for long-term (current) use of other medications   . Unspecified essential hypertension   . BPH (benign prostatic hypertrophy)   . Other and unspecified hyperlipidemia   . Other acquired absence of organ   . Unspecified hemorrhoids without mention of complication   . Diverticulosis of colon (without mention of hemorrhage)   . History of vein stripping   . Thrombocytosis 07/09/2011  . Normocytic anemia 07/09/2011  . TIA (transient ischemic attack) 07/09/2011  . Benign neoplasm of colon    Past Surgical History:  Past Surgical History  Procedure Date  . Pacemaker placement 1999, replaced 2008    hx tachy-brady  . Vein stripping laser   . Tonsillectomy and adenoidectomy     Assessment & Plan Clinical Impression: Patient is a 78 y.o. right-handed male with history of atrial fibrillation/pacemaker on chronic Coumadin therapy as  well as documented dementia and TIA 06/2011 admitted 11/18/2011 with left-sided weakness and numbness and left-sided visual difficulty as well as altered mental status.  Cranial CT scan 11/18/2011 showed no acute intracranial abnormalities. Echocardiogram with ejection fraction of 45% without embolus. Recent carotid Doppler study showed no ICA stenosis. A followup cranial CT scan 11/20/2011 showed new 7 x 10 mm hemorrhagic focus right parietal region with mild surrounding edema not present previously. Suspect acute nonhemorrhagic right occipital infarct not visualized previously. MRI was not done due to pacemaker. Neurology had been consulted 11/20/2011.  Coumadin therapy discontinued secondary to hemorrhagic transformation and plan to begin aspirin therapy 81 mg daily once INR less than 2.00 with latest INR 11/22/2011 of 2.27 . Hematology consulted due to persistent leukocytosis felt related to urinate tract infection with workup unremarkable felt to be reactive in nature with workup ongoing to rule out myeloproliferative disorder.  Patient transferred to CIR on 11/22/2011   Patient currently requires mod with mobility secondary to impaired gait and increased risk for falls , motor apraxia, field cut, decreased attention to left, decreased initiation, decreased attention, decreased awareness, decreased problem solving, decreased safety awareness, decreased memory and delayed processing and decreased standing balance and decreased balance strategies.  Prior to hospitalization, patient was independent with mobility and lived with Spouse in a House home and would walk 2-3 miles a day.  Home access is 2Stairs to enter with R rail.  Patient will benefit from skilled PT intervention to maximize safe functional mobility, minimize fall risk and decrease caregiver burden for  planned discharge home with 24 hour supervision.  Anticipate patient will benefit from follow up HH at discharge.  PT - End of Session Activity  Tolerance: Endurance does not limit participation in activity Endurance Deficit: No PT Assessment Rehab Potential: Good Barriers to Discharge: None PT Plan PT Frequency: 1-2 X/day, 60-90 minutes;5 out of 7 days Estimated Length of Stay: 10 days PT Treatment/Interventions: Ambulation/gait training;Balance/vestibular training;Cognitive remediation/compensation;Community reintegration;Discharge planning;Functional mobility training;Neuromuscular re-education;Patient/family education;Stair training;Therapeutic Activities;Therapeutic Exercise;UE/LE Strength taining/ROM;UE/LE Coordination activities;Visual/perceptual remediation/compensation PT Recommendation Follow Up Recommendations: Outpatient PT Equipment Recommended: None recommended by PT  PT Evaluation Precautions/Restrictions Precautions Precautions: Fall Precaution Comments: mild dementia, falls, L inattention Restrictions Weight Bearing Restrictions: No Pain Pain Assessment Pain Assessment: No/denies pain Pain Score: 0-No pain Home Living/Prior Functioning Home Living Lives With: Spouse Available Help at Discharge: Family Type of Home: House Home Access: Stairs to enter Secretary/administrator of Steps: 2 Entrance Stairs-Rails: Right Home Layout: Two level;Able to live on main level with bedroom/bathroom Alternate Level Stairs-Number of Steps: wife reports pt does not use upstairs Bathroom Shower/Tub: Walk-in shower;Door [employer] Toilet: Standard Bathroom Accessibility: Yes How Accessible: Accessible via walker Home Adaptive Equipment: None Additional Comments: Walked 2-3 miles every day with wife Prior Function Level of Independence: Independent with gait;Independent with transfers Able to Take Stairs?: Yes Driving: Yes Vocation: Retired Leisure: Hobbies-yes (Comment) Comments: Sports: basketball, baseball, GOL[employer]! Vision/Perception  Vision - History Baseline Vision: Wears glasses only for reading Patient Visual  Report: Peripheral vision impairment Vision - Assessment Eye Alignment: Impaired (comment) Vision Assessment: Vision tested Tracking/Visual Pursuits: Decreased smoothness of eye movement to LEFT superior field;Requires cues, head turns, or add eye shifts to track;Impaired - to be further tested in functional context Saccades: Additional eye shifts occurred during testing;Additional head turns occurred during testing;Undershoots Perception Perception: Impaired Inattention/Neglect: Does not attend to left visual field;Does not attend to left side of body Comments: Pt also presents with deficits with left/right discrimination, impaired proprioception on Lt Praxis Praxis: Impaired Praxis Impairment Details: Motor planning  Cognition Overall Cognitive Status: Impaired Arousal/Alertness: Lethargic Orientation Level: Oriented to person;Disoriented to time;Disoriented to situation;Disoriented to place Attention: Focused;Sustained Focused Attention: Appears intact Sustained Attention: Impaired Sustained Attention Impairment: Verbal basic;Functional basic (max assist) Memory: Impaired Memory Impairment: Storage deficit;Retrieval deficit;Decreased recall of new information;Decreased long term memory;Decreased short term memory Decreased Long Term Memory: Verbal basic;Functional basic Decreased Short Term Memory: Verbal basic;Functional basic Awareness: Impaired Awareness Impairment: Intellectual impairment Problem Solving: Impaired Problem Solving Impairment: Verbal basic;Functional basic Executive Function: Sequencing;Organizing;Decision Making;Initiating;Self Monitoring;Self Correcting;Reasoning Reasoning: Impaired Reasoning Impairment: Verbal basic;Functional basic Sequencing: Impaired Sequencing Impairment: Verbal basic;Functional basic Organizing: Impaired Organizing Impairment: Verbal basic;Functional basic Decision Making: Impaired Decision Making Impairment: Verbal basic;Functional  basic Initiating: Impaired Initiating Impairment: Verbal basic;Functional basic Self Monitoring: Impaired Self Monitoring Impairment: Verbal basic;Functional basic Self Correcting: Impaired Self Correcting Impairment: Verbal basic;Functional basic Safety/Judgment: Impaired Sensation Sensation Light Touch: Impaired by gross assessment (impaired L, some extinction) Hot/Cold: Appears Intact Proprioception: Impaired Detail Proprioception Impaired Details: Impaired LLE Additional Comments: Pt poor report, difficult to fully assess Coordination Gross Motor Movements are Fluid and Coordinated: Yes Fine Motor Movements are Fluid and Coordinated: No Finger Nose Finger Test: mild dysmetria, required increased time, easily distracted Motor  Motor Motor: Motor apraxia  Mobility Bed Mobility Bed Mobility: Supine to Sit;Sitting - Scoot to Edge of Bed Supine to Sit: 4: Min assist Sitting - Scoot to [place] of Bed: 4: Min Pharmacist, community Transfers: 4: Min Actuary Details: Tactile cues  for initiation;Tactile cues for sequencing;Visual cues/gestures for precautions/safety;Verbal cues for sequencing;Verbal cues for technique Stand Pivot Transfer Details (indicate cue type and reason): Needs cues for L hand placement; when turning to surface on L performs 360 degree turn Locomotion  Ambulation Ambulation: Yes Ambulation/Gait Assistance: 3: Mod assist Ambulation Distance (Feet): 150 Feet Assistive device: None Ambulation/Gait Assistance Details: Tactile cues for weight shifting;Visual cues/gestures for precautions/safety;Verbal cues for precautions/safety Ambulation/Gait Assistance Details: Patient tends to drift to the R; cues needed to attend to L visual field and attend to obstacles in L and for safety; decreased gait velocity and decreased overall step/stride length and intermittent LOB to L.  Unable to initially find door handle on L side of door or use visual cues  of sign outside door to identify his room; when reading sign patient only read numbers on R side of sign with total cues. Gait Gait: Yes Gait velocity: 1.42 ft/sec safe for household and neighborhood ambulation but at increased risk for recurrent fall (<1.22ft/sec Stairs / Additional Locomotion Stairs: Yes Stairs Assistance: 4: Min assist;3: Mod assist Stairs Assistance Details: Tactile cues for initiation;Tactile cues for sequencing;Visual cues/gestures for precautions/safety;Visual cues/gestures for sequencing;Verbal cues for sequencing;Verbal cues for technique Stairs Assistance Details (indicate cue type and reason): Stairs with R rail with min-mod A for safety and sequence but able to perform alternating pattern Stair Management Technique: One rail Right;Alternating pattern;Forwards Number of Stairs: 5  Height of Stairs: 6  Wheelchair Mobility Wheelchair Mobility: No  Balance Standardized Balance Assessment Standardized Balance Assessment: Hospital doctor Berg Balance Test Sit to Stand: Able to stand without using hands and stabilize independently Standing Unsupported: Able to stand safely 2 minutes Sitting with Back Unsupported but Feet Supported on Floor or Stool: Able to sit safely and securely 2 minutes Stand to Sit: Sits safely with minimal use of hands Transfers: Able to transfer safely, minor use of hands Standing Unsupported with Eyes Closed: Able to stand 10 seconds with supervision Standing Ubsupported with Feet Together: Able to place feet together independently and stand for 1 minute with supervision From Standing, Reach Forward with Outstretched Arm: Can reach confidently >25 cm (10") From Standing Position, Pick up Object from Floor: Able to pick up shoe safely and easily From Standing Position, Turn to Look Behind Over each Shoulder: Looks behind one side only/other side shows less weight shift Turn 360 Degrees: Able to turn 360 degrees safely but slowly Standing  Unsupported, Alternately Place Feet on Step/Stool: Able to stand independently and complete 8 steps >20 seconds Standing Unsupported, One Foot in Front: Able to take small step independently and hold 30 seconds Standing on One Leg: Able to lift leg independently and hold equal to or more than 3 seconds Total Score: 46  Patient demonstrates increased fall risk as noted by score of 46/56 on Berg Balance Scale.  (<36= high risk for falls, close to 100%; 37-45 significant >80%; 46-51 moderate >50%; 52-55 lower >25%) Extremity Assessment  RUE Assessment RUE Assessment: Within Functional Limits (strength grossly 4/5) LUE Assessment LUE Assessment: Exceptions to [facility] (strength grossly 3+/5, WFL ROM, decreased prop reach behind) RLE Assessment RLE Assessment: Within Functional Limits LLE Assessment LLE Assessment: Within Functional Limits  See FIM for current functional status Refer to Care Plan for Long Term Goals  Recommendations for other services: None  Discharge Criteria: Patient will be discharged from PT if patient refuses treatment 3 consecutive times without medical reason, if treatment goals not met, if there is a change in medical  status, if patient makes no progress towards goals or if patient is discharged from hospital.  The above assessment, treatment plan, treatment alternatives and goals were discussed and mutually agreed upon: by patient and by family  [place] Desanctis 11/23/2011, 12:51 PM

## 2011-11-23 NOTE — Progress Notes (Signed)
Patient ID: [facility] [name], male   DOB: 06/05/1929, 78 y.o.   MRN: [id number] Subjective/Complaints: 78 y.o. right-handed male with history of atrial fibrillation/pacemaker on chronic Coumadin therapy as well as documented dementia and TIA 06/2011 admitted 11/18/2011 with left-sided weakness and numbness and left-sided visual difficulty as well as altered mental status. Patient independent prior to admission per wife and driving. Cranial CT scan 11/18/2011 showed no acute intracranial abnormalities. His INR on admission was 3.02.  Echocardiogram with ejection fraction of 45% without embolus. Recent carotid Doppler study showed no ICA stenosis. A followup cranial CT scan 11/20/2011 showed new 7 x 10 mm hemorrhagic focus right parietal region with mild surrounding edema not present previously. Suspect acute nonhemorrhagic right occipital infarct not visualized previously. MRI was not done due to pacemaker. Neurology had been consulted 11/20/2011 Coumadin therapy discontinued secondary to hemorrhagic transformation and plan to begin aspirin therapy 81 mg daily once INR less than 2.00Hematology consulted due to persistent leukocytosis felt related to urinate tract infection with workup unremarkable felt to be reactive in nature with workup ongoing to rule out myeloproliferative disorder   Objective: Vital Signs: Blood pressure 127/69, pulse 70, temperature 97.3 F (36.3 C), temperature source Oral, resp. rate 19, height 6\' 5"  (1.956 m), weight 83.5 kg (184 lb 1.4 oz), SpO2 95.00%. Ct Head Wo Contrast  11/22/2011  *RADIOLOGY REPORT*  Clinical Data: Follow-up hemorrhage.  CT HEAD WITHOUT CONTRAST  Technique:  Contiguous axial images were obtained from the base of the skull through the vertex without contrast.  Comparison: 11/20/2011.  Findings: The small hemorrhagic infarct in the right posterior parietal/occipital area is stable.  There are multiple other small infarcts in the cerebellum and right and left  occipital lobes. Findings most likely due to an embolic event.  No hemispheric infarction.  IMPRESSION:  1.  Multiple small cerebellar and occipital lobe infarcts. 2.  Stable small hemorrhagic infarct in the right posterior parietal/occipital area.  Original Report Authenticated By: P. Loralie Champagne, M.D.   Results for orders placed during the hospital encounter of 11/18/11 (from the past 72 hour(s))  PREPARE FRESH FROZEN PLASMA     Status: Normal   Collection Time   11/20/11 11:30 AM      Component Value Range Comment   Unit Number [id number]      Blood Component Type THAWED PLASMA      Unit division 00      Status of Unit REL FROM [facility]      Transfusion Status OK TO TRANSFUSE      Unit Number [id number]      Blood Component Type THAWED PLASMA      Unit division 00      Status of Unit REL FROM [facility]      Transfusion Status OK TO TRANSFUSE     MRSA PCR SCREENING     Status: Normal   Collection Time   11/20/11 12:15 PM      Component Value Range Comment   MRSA by PCR NEGATIVE  NEGATIVE    PROTIME-INR     Status: Abnormal   Collection Time   11/20/11  5:52 PM      Component Value Range Comment   Prothrombin Time 28.3 (*) 11.6 - 15.2 (seconds)    INR 2.60 (*) 0.00 - 1.49    PROTIME-INR     Status: Abnormal   Collection Time   11/20/11  9:21 PM      Component Value Range Comment   Prothrombin Time 27.7 (*)  11.6 - 15.2 (seconds)    INR 2.53 (*) 0.00 - 1.49    GLUCOSE, CAPILLARY     Status: Abnormal   Collection Time   11/21/11 12:53 AM      Component Value Range Comment   Glucose-Capillary 103 (*) 70 - 99 (mg/dL)   PROTIME-INR     Status: Abnormal   Collection Time   11/21/11  4:05 AM      Component Value Range Comment   Prothrombin Time 27.5 (*) 11.6 - 15.2 (seconds)    INR 2.51 (*) 0.00 - 1.49    CBC     Status: Abnormal   Collection Time   11/21/11  4:05 AM      Component Value Range Comment   WBC 23.7 (*) 4.0 - 10.5 (K/uL)    RBC 3.56 (*) 4.22 - 5.81 (MIL/uL)    Hemoglobin 11.4 (*)  13.0 - 17.0 (g/dL)    HCT 47.8 (*) 29.5 - 52.0 (%)    MCV 93.8  78.0 - 100.0 (fL)    MCH 32.0  26.0 - 34.0 (pg)    MCHC 34.1  30.0 - 36.0 (g/dL)    RDW 62.1 (*) 30.8 - 15.5 (%)    Platelets 489 (*) 150 - 400 (K/uL)   DIFFERENTIAL     Status: Abnormal   Collection Time   11/21/11  4:05 AM      Component Value Range Comment   Neutrophils Relative 73  43 - 77 (%)    Lymphocytes Relative 8 (*) 12 - 46 (%)    Monocytes Relative 17 (*) 3 - 12 (%)    Eosinophils Relative 1  0 - 5 (%)    Basophils Relative 1  0 - 1 (%)    Neutro Abs 17.4 (*) 1.7 - 7.7 (K/uL)    Lymphs Abs 1.9  0.7 - 4.0 (K/uL)    Monocytes Absolute 4.0 (*) 0.1 - 1.0 (K/uL)    Eosinophils Absolute 0.2  0.0 - 0.7 (K/uL)    Basophils Absolute 0.2 (*) 0.0 - 0.1 (K/uL)    RBC Morphology RARE NRBCs      Smear Review LARGE PLATELETS PRESENT     LACTATE DEHYDROGENASE     Status: Abnormal   Collection Time   11/21/11  4:05 AM      Component Value Range Comment   LDH 457 (*) 94 - 250 (U/L)   LEUKOCYTE ALKALINE PHOSPHATASE     Status: Normal   Collection Time   11/21/11  4:05 AM      Component Value Range Comment   Leukocyte Alkaline  Phos Stain 108  30 - 140    GLUCOSE, CAPILLARY     Status: Abnormal   Collection Time   11/21/11  8:14 AM      Component Value Range Comment   Glucose-Capillary 107 (*) 70 - 99 (mg/dL)   GLUCOSE, CAPILLARY     Status: Normal   Collection Time   11/21/11 11:44 AM      Component Value Range Comment   Glucose-Capillary 98  70 - 99 (mg/dL)   PROTIME-INR     Status: Abnormal   Collection Time   11/22/11  3:53 AM      Component Value Range Comment   Prothrombin Time 25.4 (*) 11.6 - 15.2 (seconds)    INR 2.27 (*) 0.00 - 1.49    CBC     Status: Abnormal   Collection Time   11/22/11  3:53 AM  Component Value Range Comment   WBC 19.5 (*) 4.0 - 10.5 (K/uL)    RBC 3.76 (*) 4.22 - 5.81 (MIL/uL)    Hemoglobin 12.1 (*) 13.0 - 17.0 (g/dL)    HCT 11.9 (*) 14.7 - 52.0 (%)    MCV 93.6  78.0 - 100.0 (fL)     MCH 32.2  26.0 - 34.0 (pg)    MCHC 34.4  30.0 - 36.0 (g/dL)    RDW 82.9 (*) 56.2 - 15.5 (%)    Platelets 500 (*) 150 - 400 (K/uL)      HEENT: normal Cardio: RRR Resp: CTA B/L GI: BS positive Extremity:  No Edema Skin:   Intact Neuro: Confused and Abnormal Motor 4/5 in BUE and BLE Musc/Skel:  Normal L neglect on confrontation testing  Assessment/Plan: 1. Functional deficits secondary to R parieto occipital hemorrhage due to coagulopathy  which require 3+ hours per day of interdisciplinary therapy in a comprehensive inpatient rehab setting. Physiatrist is providing close team supervision and 24 hour management of active medical problems listed below. Physiatrist and rehab team continue to assess barriers to discharge/monitor patient progress toward functional and medical goals. FIM:       FIM - Toileting Toileting steps completed by patient: Performs perineal hygiene Toileting: 2: Max-Patient completed 1 of 3 steps  FIM - Diplomatic Services operational officer Devices: Grab bars Toilet Transfers: 3-To toilet/BSC: Mod A (lift or lower assist);3-From toilet/BSC: Mod A (lift or lower assist)  FIM - Bed/Chair Transfer Bed/Chair Transfer: 3: Bed > Chair or W/C: Mod A (lift or lower assist);3: Chair or W/C > Bed: Mod A (lift or lower assist)     Comprehension Comprehension Mode: Auditory Comprehension: 4-Understands basic 75 - 89% of the time/requires cueing 10 - 24% of the time  Expression Expression Mode: Verbal Expression: 4-Expresses basic 75 - 89% of the time/requires cueing 10 - 24% of the time. Needs helper to occlude trach/needs to repeat words.  Social Interaction Social Interaction: 3-Interacts appropriately 50 - 74% of the time - May be physically or verbally inappropriate.  Problem Solving Problem Solving: 2-Solves basic 25 - 49% of the time - needs direction more than half the time to initiate, plan or complete simple activities  Memory Memory:  2-Recognizes or recalls 25 - 49% of the time/requires cueing 51 - 75% of the time  Medical Problem List and Plan:  1. 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 DVT Prophylaxis/Anticoagulation: SCDs. Monitor for any signs of DVT  3. Leukocytosis: Latest WBC of 19,500. Followup per hematology services. Workup thus far unremarkable felt to be reactive in nature. Followup CBC in a.m.  4. Atrial fibrillation/pacemaker. Coumadin discontinued secondary hemorrhagic infarct. Plan to begin aspirin therapy once INR less than 2.00. Cardiac rate is controlled  5. Dementia. Continue Aricept and Namenda. Speech therapy to provide evaluation. Will discuss baseline cognition with wife  6. History BPH. Check PVRs x3. Latest urine study showed no growth    LOS (Days) 1 A FACE TO FACE EVALUATION WAS PERFORMED  [name] 11/23/2011, 6:53 AM

## 2011-11-23 NOTE — Progress Notes (Signed)
  Overall Plan of Care [facility]) Patient Details Name: [name] MRN: [id number] DOB: Apr 20, 1929  Diagnosis:  Rehab for CVA Primary Diagnosis:    CVA (cerebrovascular accident due to intracerebral hemorrhage) Co-morbidities: Moderate dementia, A fib   Functional Problem List  Patient demonstrates impairments in the following areas: Balance, Cognition, Motor, Perception, Sensory  and Vision  Basic ADL's: bathing, dressing and toileting Advanced ADL's: simple meal preparation  Transfers:  bed mobility, bed to chair, toilet, car and floor Locomotion:  ambulation and stairs  Additional Impairments:  Social Cognition   problem solving, memory, attention and awareness  Anticipated Outcomes Item Anticipated Outcome  Eating/Swallowing    Basic self-care    Tolieting    Bowel/Bladder  Regular bm every 1-2 days/can manage urinal independent  Transfers  Supervision  Locomotion  Supervision  Communication    Cognition    Pain    Safety/Judgment  Remains safe,injury-free/d/c to supervision for safety/safe mobility  Other     Therapy Plan: PT Frequency: 1-2 X/day, 60-90 minutes;5 out of 7 days OT Frequency: 1-2 X/day, 60-90 minutes SLP Frequency: 1-2 X/day, 30-60 minutes;5 out of 7 days   Team Interventions: Item RN PT OT SLP SW TR Other  Self Care/Advanced ADL Retraining         Neuromuscular Re-Education  x       Therapeutic Activities  x       UE/LE Strength Training/ROM  x       UE/LE Coordination Activities  x       Visual/Perceptual Remediation/Compensation  x       DME/Adaptive Equipment Instruction         Therapeutic Exercise  x       Balance/Vestibular Training  x       Patient/Family Education  x       Cognitive Remediation/Compensation  x       Functional Mobility Training  x       Ambulation/Gait Training  x       Engineer, manufacturing systems  x        Dysphagia/Aspiration Film/video editor         Bladder Management         Bowel Management         Disease Management/Prevention         Pain Management         Medication Management         Skin Care/Wound Management         Splinting/Orthotics         Discharge Planning  x       Psychosocial Support                            Team Discharge Planning: Destination:  Home Projected Follow-up:  PT, OT, SLP and Outpatient Projected Equipment Needs:  None Patient/family involved in discharge planning:  Yes  MD ELOS: 7-10d Medical Rehab Prognosis:  Fair Assessment: 78 yo male with Afib, developed ICH while on warfarin now requiring 24/7 rehab RN and MD, CIR PT,OT.SLP

## 2011-11-23 NOTE — Progress Notes (Signed)
Physical Therapy Session Note  Patient Details  Name: [name] MRN: [id number] Date of Birth: 05-Aug-1928  Today's Date: 11/23/2011 Time: 6295-2841 Time Calculation (min): 35 min  Short Term Goals: Week 1:  PT Short Term Goal 1 (Week 1): = LTG secondary to LOS  Skilled Therapeutic Interventions/Progress Updates:  See below for details.  Wife found therapist later to tell her that the pt was wanting to work some more on putting. Therapy Documentation Precautions:  Precautions Precautions: Fall Precaution Comments: mild dementia, falls, L inattention Restrictions Weight Bearing Restrictions: No Pain: Pain Assessment Pain Assessment: No/denies pain Mobility: Sit to stand with min@. Locomotion : Gait on unit with min@ x 100 x 2. Other Treatments:   Dynamic balance training combined with vision and perceptually assessment/training while putting.  Pt with difficulty finding where the holes were located to the left and with aligning himself to putt the ball.  Wife assisting with treatment, appropriately cueing pt how to find the holes and assisting with correcting the alignment of his putter.  Pt unable to explain to therapist how his vision/perception is impaired to complete the task.  No LOB while performing. See FIM for current functional status  Therapy/Group: Individual Therapy  [name] 11/23/2011, 3:18 PM

## 2011-11-24 ENCOUNTER — Encounter (HOSPITAL_COMMUNITY): Payer: Self-pay | Admitting: *Deleted

## 2011-11-24 DIAGNOSIS — Z5189 Encounter for other specified aftercare: Secondary | ICD-10-CM

## 2011-11-24 DIAGNOSIS — I6330 Cerebral infarction due to thrombosis of unspecified cerebral artery: Secondary | ICD-10-CM

## 2011-11-24 NOTE — Progress Notes (Addendum)
Occupational Therapy Session Note  Patient Details  Name: [name] MRN: [id number] Date of Birth: [birth year]  Today's Date: 11/24/2011 Time: 1430-1500 Time Calculation (min): 30 min  Short Term Goals: Week 1:  OT Short Term Goal 1 (Week 1): Pt will complete bathing with intermittent cues with supervision/setup assist OT Short Term Goal 2 (Week 1): Pt will complete UB dressing with intermittent cues with supervision/setup assist OT Short Term Goal 3 (Week 1): Pt will complete grooming with supervision OT Short Term Goal 4 (Week 1): Pt will complete toilet transfer with supervision  Skilled Therapeutic Interventions/Progress Updates:  Pt performed Grooming task (brushing teeth) while seated at sink; SBA. Pt required max vc's for step by step directions and to initiate task. Patient was able to verbalize intial step of brushing teeth but needed vc's from therapist to actually perform step. Pt has difficulty performing activities when given commands. Patient was asked to locate supplies for teeth brushing and needed reminders to look to left side of sink. Patient did show signs of right homonymous hemianopia during grooming task. Pt was then given sheet of paper with lines in various locations. Pt was asked to put a line through each individual line. Pt only put lines on the immediate right side edge of paper and did not attempt to look beyond right side of paper. Pt is easily distracted and needed max vc's to remain on task. Pt's STM is very poor and he frequently forgets what he is asked to do.  Therapy Documentation Precautions:  Precautions Precautions: Fall Precaution Comments: h/o dementia, L inattention; D3 diet with thin liquids, intermittent supervision Restrictions Weight Bearing Restrictions: No Pain: No reports of pain.  See FIM for current functional status  Therapy/Group: Individual Therapy  Addendum: pain level entered. LE 6/7  Kaiden Pech, Charisse March 11/24/2011, 3:15  PM

## 2011-11-24 NOTE — Progress Notes (Signed)
Patient ID: [name], male   DOB: August 14, 1928, 78 y.o.   MRN: [id number] Subjective/Complaints: 78 y.o. right-handed male with history of atrial fibrillation/pacemaker on chronic Coumadin therapy as well as documented dementia and TIA 06/2011 admitted 11/18/2011 with left-sided weakness and numbness and left-sided visual difficulty as well as altered mental status. Patient independent prior to admission per wife and driving. Cranial CT scan 11/18/2011 showed no acute intracranial abnormalities. His INR on admission was 3.02.  Echocardiogram with ejection fraction of 45% without embolus. Recent carotid Doppler study showed no ICA stenosis. A followup cranial CT scan 11/20/2011 showed new 7 x 10 mm hemorrhagic focus right parietal region with mild surrounding edema not present previously. Suspect acute nonhemorrhagic right occipital infarct not visualized previously. MRI was not done due to pacemaker. Neurology had been consulted 11/20/2011 Coumadin therapy discontinued secondary to hemorrhagic transformation and plan to begin aspirin therapy 81 mg daily once INR less than 2.00Hematology consulted due to persistent leukocytosis felt related to urinate tract infection with workup unremarkable felt to be reactive in nature with workup ongoing to rule out myeloproliferative disorder  No problems reported but pt with limited insight related to dementia and CVA  Review of Systems  Psychiatric/Behavioral: Positive for memory loss.  All other systems reviewed and are negative.     Objective: Vital Signs: Blood pressure 121/72, pulse 73, temperature 97.6 F (36.4 C), temperature source Oral, resp. rate 18, height 6\' 5"  (1.956 m), weight 83.5 kg (184 lb 1.4 oz), SpO2 97.00%. No results found. Results for orders placed during the hospital encounter of 11/22/11 (from the past 72 hour(s))  CBC     Status: Abnormal   Collection Time   11/23/11  6:22 AM      Component Value Range Comment   WBC 15.8 (*) 4.0 -  10.5 (K/uL)    RBC 3.69 (*) 4.22 - 5.81 (MIL/uL)    Hemoglobin 11.8 (*) 13.0 - 17.0 (g/dL)    HCT 28.4 (*) 13.2 - 52.0 (%)    MCV 94.3  78.0 - 100.0 (fL)    MCH 32.0  26.0 - 34.0 (pg)    MCHC 33.9  30.0 - 36.0 (g/dL)    RDW 44.0 (*) 10.2 - 15.5 (%)    Platelets 519 (*) 150 - 400 (K/uL)   COMPREHENSIVE METABOLIC PANEL     Status: Abnormal   Collection Time   11/23/11  6:22 AM      Component Value Range Comment   Sodium 141  135 - 145 (mEq/L)    Potassium 3.5  3.5 - 5.1 (mEq/L)    Chloride 102  96 - 112 (mEq/L)    CO2 27  19 - 32 (mEq/L)    Glucose, Bld 93  70 - 99 (mg/dL)    BUN 24 (*) 6 - 23 (mg/dL)    Creatinine, Ser 7.25  0.50 - 1.35 (mg/dL)    Calcium 9.3  8.4 - 10.5 (mg/dL)    Total Protein 6.4  6.0 - 8.3 (g/dL)    Albumin 3.6  3.5 - 5.2 (g/dL)    AST 22  0 - 37 (U/L)    ALT 12  0 - 53 (U/L)    Alkaline Phosphatase 60  39 - 117 (U/L)    Total Bilirubin 1.5 (*) 0.3 - 1.2 (mg/dL)    GFR calc non Af Amer 75 (*) >90 (mL/min)    GFR calc Af Amer 87 (*) >90 (mL/min)   DIFFERENTIAL     Status: Abnormal  Collection Time   11/23/11  6:22 AM      Component Value Range Comment   Neutrophils Relative 69  43 - 77 (%)    Lymphocytes Relative 15  12 - 46 (%)    Monocytes Relative 9  3 - 12 (%)    Eosinophils Relative 7 (*) 0 - 5 (%)    Basophils Relative 0  0 - 1 (%)    Band Neutrophils 0  0 - 10 (%)    Metamyelocytes Relative 0      Myelocytes 0      Promyelocytes Absolute 0      Blasts 0      nRBC 21 (*) 0 (/100 WBC)    Neutro Abs 10.9 (*) 1.7 - 7.7 (K/uL)    Lymphs Abs 2.4  0.7 - 4.0 (K/uL)    Monocytes Absolute 1.4 (*) 0.1 - 1.0 (K/uL)    Eosinophils Absolute 1.1 (*) 0.0 - 0.7 (K/uL)    Basophils Absolute 0.0  0.0 - 0.1 (K/uL)    RBC Morphology POLYCHROMASIA PRESENT      WBC Morphology TOXIC GRANULATION      Smear Review PLATELETS APPEAR INCREASED     PROTIME-INR     Status: Abnormal   Collection Time   11/23/11  6:22 AM      Component Value Range Comment   Prothrombin  Time 21.3 (*) 11.6 - 15.2 (seconds)    INR 1.81 (*) 0.00 - 1.49    PROTIME-INR     Status: Abnormal   Collection Time   11/24/11  6:25 AM      Component Value Range Comment   Prothrombin Time 18.9 (*) 11.6 - 15.2 (seconds)    INR 1.55 (*) 0.00 - 1.49       HEENT: normal Cardio: RRR Resp: CTA B/L GI: BS positive Extremity:  No Edema Skin:   Intact Neuro: Confused and Abnormal Motor 4/5 in BUE and BLE Musc/Skel:  Normal L neglect on confrontation testing  Assessment/Plan: 1. Functional deficits secondary to R parieto occipital hemorrhage due to coagulopathy  which require 3+ hours per day of interdisciplinary therapy in a comprehensive inpatient rehab setting. Physiatrist is providing close team supervision and 24 hour management of active medical problems listed below. Physiatrist and rehab team continue to assess barriers to discharge/monitor patient progress toward functional and medical goals. FIM: FIM - Bathing Bathing Steps Patient Completed: Chest;Right Arm;Left Arm;Abdomen;Buttocks;Right upper leg;Left upper leg Bathing: 3: Mod-Patient completes 5-7 74f 10 parts or 50-74%  FIM - Upper Body Dressing/Undressing Upper body dressing/undressing steps patient completed: Pull shirt over trunk;Thread/unthread right sleeve of pullover shirt/dresss Upper body dressing/undressing: 3: Mod-Patient completed 50-74% of tasks FIM - Lower Body Dressing/Undressing Lower body dressing/undressing steps patient completed: Thread/unthread right underwear leg;Pull underwear up/down;Thread/unthread right pants leg;Pull pants up/down Lower body dressing/undressing: 3: Mod-Patient completed 50-74% of tasks  FIM - Toileting Toileting steps completed by patient: Adjust clothing prior to toileting;Performs perineal hygiene Toileting Assistive Devices: Grab bar or rail for support Toileting: 3: Mod-Patient completed 2 of 3 steps  FIM - Diplomatic Services operational officer Devices: Grab  bars Toilet Transfers: 3-To toilet/BSC: Mod A (lift or lower assist);3-From toilet/BSC: Mod A (lift or lower assist)  FIM - Bed/Chair Transfer Bed/Chair Transfer: 4: Bed > Chair or W/C: Min A (steadying Pt. > 75%);4: Chair or W/C > Bed: Min A (steadying Pt. > 75%)  FIM - Locomotion: Wheelchair Locomotion: Wheelchair: 0: Activity did not occur FIM - Locomotion: Ambulation Ambulation/Gait  Assistance: 3: Mod assist Locomotion: Ambulation: 3: Travels 150 ft or more with moderate assistance (Pt: 50 - 74%)  Comprehension Comprehension Mode: Auditory Comprehension: 2-Understands basic 25 - 49% of the time/requires cueing 51 - 75% of the time  Expression Expression Mode: Verbal Expression: 2-Expresses basic 25 - 49% of the time/requires cueing 50 - 75% of the time. Uses single words/gestures.  Social Interaction Social Interaction: 2-Interacts appropriately 25 - 49% of time - Needs frequent redirection.  Problem Solving Problem Solving: 2-Solves basic 25 - 49% of the time - needs direction more than half the time to initiate, plan or complete simple activities  Memory Memory: 1-Recognizes or recalls less than 25% of the time/requires cueing greater than 75% of the time  Medical Problem List and Plan:  1. 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 DVT Prophylaxis/Anticoagulation: SCDs. Monitor for any signs of DVT  3. Leukocytosis: Latest WBC of 15,800. Followup per hematology services. Workup thus far unremarkable felt to be reactive in nature. 4. Atrial fibrillation/pacemaker. Coumadin discontinued secondary hemorrhagic infarct. Plan to begin aspirin therapy once INR less than 2.00. Cardiac rate is controlled  5. Dementia. Continue Aricept and Namenda. Speech therapy to provide evaluation. Will discuss baseline cognition with wife  6. History BPH. Check PVRs x3. Latest urine study showed no growth    LOS (Days) 2 A FACE TO FACE  EVALUATION WAS PERFORMED  Antoine Fiallos E 11/24/2011, 7:08 AM

## 2011-11-24 NOTE — Evaluation (Signed)
Speech Language Pathology Bedside Swallow Evaluation & Treatment  Patient Details  Name: [name] MRN: [id number] Date of Birth: Sep 04, 1928  SLP Diagnosis: Cognitive Impairments;Speech and Language deficits  Rehab Potential: Good ELOS:  see cognitive-linguistic evals  Today's Date: 11/24/2011 Time: 0830-0910 Time Calculation (min): 40 min  Problem List:  Patient Active Problem List  Diagnoses  . POLYP, COLON  . HYPERLIPIDEMIA  . Dementia  . HYPERTENSION  . Atrial fibrillation  . BRADYCARDIA  . HEMORRHOIDS  . DIVERTICULOSIS, COLON  . LEG PAIN, LEFT  . OTHER ABNORMAL BLOOD CHEMISTRY  . BENIGN PROSTATIC HYPERTROPHY, HX OF  . PACEMAKER, PERMANENT  . ADENOCARCINOMA, PROSTATE, GLEASON GRADE 4  . Hyperglycemia  . Numbness and tingling in left hand  . TIA (transient ischemic attack)  . CVA (cerebrovascular accident due to intracerebral hemorrhage)   Past Medical History:  Past Medical History  Diagnosis Date  . Malignant neoplasm of prostate   . Unspecified psychosis   . Cardiac pacemaker [employer]   . Atrial fibrillation   . Pain in limb   . Encounter for long-term (current) use of other medications   . Unspecified essential hypertension   . BPH (benign prostatic hypertrophy)   . Other and unspecified hyperlipidemia   . Other acquired absence of organ   . Unspecified hemorrhoids without mention of complication   . Diverticulosis of colon (without mention of hemorrhage)   . History of vein stripping   . Thrombocytosis 07/09/2011  . Normocytic anemia 07/09/2011  . TIA (transient ischemic attack) 07/09/2011  . Benign neoplasm of colon    Past Surgical History:  Past Surgical History  Procedure Date  . Pacemaker placement 1999, replaced 2008    hx tachy-brady  . Vein stripping laser   . Tonsillectomy and adenoidectomy     Assessment / Plan / Recommendation Clinical Impression  Patient presents with [facility] oral and phrayngeal phase of swallow; therefore  upgraded to regualr textures. Of note, patient requires set up assist and  cues to attend to left of environemt during meal as well as cues to keep going throughout session and as a result cogntion impacts his ability to self feed.     SLP Assessment  Patient will need skilled Speech Lanaguage Pathology Services during CIR admission    Recommendations  Follow up Recommendations: Home Health SLP Equipment Recommended: None recommended by SLP    SLP Frequency 1-2 X/day, 30-60 minutes;5 out of 7 days for cognition       Pain Pain Assessment Pain Assessment: No/denies pain Pain Score: 0-No pain Prior Functioning  independent with self feeding  Short Term Goals: Week 1:  See long term goals  See FIM for current functional status Refer to Care Plan for Long Term Goals  Recommendations for other services: None  Discharge Criteria: Patient will be discharged from SLP if patient refuses treatment 3 consecutive times without medical reason, if treatment goals not met, if there is a change in medical status, if patient makes no progress towards goals or if patient is discharged from hospital.  The above assessment, treatment plan, treatment alternatives and goals were discussed and mutually agreed upon: by patient and by family  Speech Language Pathology Daily Session Note  Patient Details  Name: [name] MRN: [id number] Date of Birth: 11/27/1928  Today's Date: 11/24/2011  Short Term Goals: See Long Term Goals  Skilled Therapeutic Interventions: Session focused on SLP providing max assist semantic cues to scan to left of breakfast tray and  locate items for self feeding as well as max assist semantic cues to sustain attention to self feeding task.     FIM:  Comprehension Comprehension Mode: Auditory Comprehension: 2-Understands basic 25 - 49% of the time/requires cueing 51 - 75% of the time Expression Expression Mode: Verbal Expression: 2-Expresses basic 25 - 49% of the  time/requires cueing 50 - 75% of the time. Uses single words/gestures. Social Interaction Social Interaction: 2-Interacts appropriately 25 - 49% of time - Needs frequent redirection. Problem Solving Problem Solving: 2-Solves basic 25 - 49% of the time - needs direction more than half the time to initiate, plan or complete simple activities Memory Memory: 1-Recognizes or recalls less than 25% of the time/requires cueing greater than 75% of the time FIM - Eating Eating Activity: 5: Supervision/cues;5: Set-up assist for open containers;5: Set-up assist for cut food  Pain Pain Assessment Pain Assessment: No/denies pain Pain Score: 0-No pain  Therapy/Group: Individual Therapy  Charlane Ferretti., CCC-SLP [phone]  Eldred Sooy 11/24/2011, 4:10 PM

## 2011-11-24 NOTE — Progress Notes (Signed)
Occupational Therapy Session Note  Patient Details  Name: [name] MRN: [id number] Date of Birth: Jun 07, 1929  Today's Date: 11/24/2011 Time: 0865-7846 Time Calculation (min): 60 min  Short Term Goals: Week 1:  OT Short Term Goal 1 (Week 1): Pt will complete bathing with intermittent cues with supervision/setup assist OT Short Term Goal 2 (Week 1): Pt will complete UB dressing with intermittent cues with supervision/setup assist OT Short Term Goal 3 (Week 1): Pt will complete grooming with supervision OT Short Term Goal 4 (Week 1): Pt will complete toilet transfer with supervision  Skilled Therapeutic Interventions:  Patient's wife present upon arrival and asking appropriate questions regarding his visual deficit and if moving to [facility] is still a good idea.  Wife stayed to observe session and assisted with patient's shoes at the end of our session.  Self care retraining to include shower and dressing.  Focus session on attention to left, initiating tasks, completing tasks, apraxia, shower transfer.  Patient attempting to don his shirt as though the shirt was a pair of pants.    Therapy Documentation Precautions:  Precautions Precautions: Fall Precaution Comments: h/o dementia, L inattention; D3 diet with thin liquids, intermittent supervision Restrictions Weight Bearing Restrictions: No  See FIM for current functional status  Therapy/Group: Individual Therapy and caregiver education  Pleas Patricia 11/24/2011, 2:39 PM

## 2011-11-24 NOTE — Progress Notes (Signed)
Physical Therapy Session Note  Patient Details  Name: [name] MRN: [id number] Date of Birth: 08-Apr-1929  Today's Date: 11/24/2011 Time: 1100-1204 Time Calculation (min): 64 min  Short Term Goals: Week 1:  PT Short Term Goal 1 (Week 1): = LTG secondary to LOS  Therapy Documentation Precautions:  Precautions Precautions: Fall Precaution Comments: h/o dementia, L inattention; D3 diet with thin liquids, intermittent supervision Restrictions Weight Bearing Restrictions: No Pain: Pain Assessment Pain Assessment: No/denies pain Pain Score: 0-No pain Locomotion : Ambulation Ambulation/Gait Assistance: 4: Min assist Ambulation Distance (Feet): 150 Feet Assistive device: None High Level Ambulation High Level Ambulation: Direction changes;Sudden stops;Head turns;Other high level ambulation Direction Changes: Weaving L and R around cones with verbal and visual cues to attend to obstacle on L Head Turns: Vertical and horizontal with increased cues needed to attend to L environment and min A secondary to LOB during head turns High Level Ambulation - Other Comments: Gait training with household and community challenges of stepping over low obstacle, up and down curb, across compliant surface, changes in gait speed  Other Treatments: Treatments Therapeutic Activity: Use of golf recreational activity that patient enjoys for balance, attention to activity, L visual scanning and sequencing with supervision and verbal cues for sequencing and preparation for shot; attempted to have patient keep score but patient unable to alternate attention between two tasks.  Performed standing L visual scanning and and matching activity in moderately distracting environment with moderate-maximum verbal and visual cues to attend to task and to scan fully to L to find match.  See FIM for current functional status  Therapy/Group: Individual Therapy  Edman Circle [facility] 11/24/2011, 12:59 PM

## 2011-11-24 NOTE — Progress Notes (Signed)
Social Work Assessment and Plan Social Work Assessment and Plan  Patient Details  Name: [name] MRN: [id number] Date of Birth: 1928-09-09  Today's Date: 11/24/2011  Problem List:  Patient Active Problem List  Diagnoses  . POLYP, COLON  . HYPERLIPIDEMIA  . Dementia  . HYPERTENSION  . Atrial fibrillation  . BRADYCARDIA  . HEMORRHOIDS  . DIVERTICULOSIS, COLON  . LEG PAIN, LEFT  . OTHER ABNORMAL BLOOD CHEMISTRY  . BENIGN PROSTATIC HYPERTROPHY, HX OF  . PACEMAKER, PERMANENT  . ADENOCARCINOMA, PROSTATE, GLEASON GRADE 4  . Hyperglycemia  . Numbness and tingling in left hand  . TIA (transient ischemic attack)  . CVA (cerebrovascular accident due to intracerebral hemorrhage)   Past Medical History:  Past Medical History  Diagnosis Date  . Malignant neoplasm of prostate   . Unspecified psychosis   . Cardiac pacemaker [employer]   . Atrial fibrillation   . Pain in limb   . Encounter for long-term (current) use of other medications   . Unspecified essential hypertension   . BPH (benign prostatic hypertrophy)   . Other and unspecified hyperlipidemia   . Other acquired absence of organ   . Unspecified hemorrhoids without mention of complication   . Diverticulosis of colon (without mention of hemorrhage)   . History of vein stripping   . Thrombocytosis 07/09/2011  . Normocytic anemia 07/09/2011  . TIA (transient ischemic attack) 07/09/2011  . Benign neoplasm of colon    Past Surgical History:  Past Surgical History  Procedure Date  . Pacemaker placement 1999, replaced 2008    hx tachy-brady  . Vein stripping laser   . Tonsillectomy and adenoidectomy    Social History:  reports that he has quit smoking. His smoking use included Cigarettes. He has a 4 pack-year smoking history. He does not have any smokeless tobacco history on file. He reports that he drinks about 1.5 ounces of alcohol per week. He reports that he does not use illicit drugs.  Family / Support  Systems Marital Status: Married Patient Roles: Spouse;Parent Spouse/Significant Other: Jean-wife [phone]  [phone] Children: Will-son  [phone]   Algis Liming [phone] Other Supports: Friends and church members Anticipated Caregiver: Wife Ability/Limitations of Caregiver: Can provide supervision/min level Caregiver Availability: 24/7 Family Dynamics: Close family, will do for one another.  Pt was pretty self sufficient prior to admission.  Processing seems to really be affected  Social History Preferred language: English Religion: Non-Denominational Cultural Background: No issues Education: Automotive engineer Educated Read: Yes Write: Yes Employment Status: Retired Fish farm manager Issues: No issues Guardian/Conservator: None   Abuse/Neglect Physical Abuse: Denies Verbal Abuse: Denies Sexual Abuse: Denies Exploitation of patient/patient's resources: Denies Self-Neglect: Denies  Emotional Status Pt's affect, behavior adn adjustment status: Pt wants to do well.  Having to be directed to eat lunch.  Wife reports he did everything for himself, had trouble with remembering names but otherwise the rest.  He tries to do what is asked of him. Needs much directing and cues. Recent Psychosocial Issues: other medical issues Pyschiatric History: No history deferred depression screen due to pt's processing issues, appears his dementia is much worse now after this stroke. Will monitor his coping while here and assess wehn appropriate. Substance Abuse History: No issues  Patient / Family Perceptions, Expectations & Goals Pt/Family understanding of illness & functional limitations: Wife and D-I-L have a basic understanding of pt's condition and deficits.  Pt reports it;s going well here.  Unsure if he realizes how he is doing.  Wife here and attened therapies with pt, so saw how he was doing in PT. Premorbid pt/family roles/activities: Husband, Father, Grandfather, Retiree,  Home owner, [employer], etc Anticipated changes in roles/activities/participation: Plans to resume Pt/family expectations/goals: Wife reports: " I know I will need to learn a lot while he is here."  She reports: " He was much better before this."  Pt reports: " I am trying."    [employer]: Other (Comment) (Completing paperwork for [facility]-i living apt.) Premorbid Home Care/DME Agencies: None Transportation available at discharge: Family Resource referrals recommended: Support group (specify) (CVA/Caregiver Support Group)  Discharge Planning Living Arrangements: Spouse/significant other Support Systems: Spouse/significant other;Children;Church/faith community Type of Residence: Private residence Insurance Resources: Media planner (specify) ([employer]HC-Medicare Complete) Financial Resources: Restaurant manager, fast food Screen Referred: No Living Expenses: Lives with family Money Management: Spouse Do you have any problems obtaining your medications?: No Home Management: Wife Patient/Family Preliminary Plans: Return home with wife who can provide care and then the plan is to move inot [facility]- I living.  Then they would have the option to go to alternatvie level of care once residents there. Social Work Anticipated Follow Up Needs: HH/OP;Support Group DC Planning Additional Notes/Comments: Pt seems to require mcuh cueing and his dementia has become worse with this event.  Clinical Impression Pleasant family, pt needs much cueing to try to eat lunch, while wife seems over whelmed with all she may need to learn while here.  Going forth with [facility] plan and then would be able to utilize their services once residents There.  Provide much support to wife and assist with the plans.    Lucy Chris 11/24/2011, 3:13 PM

## 2011-11-25 MED ORDER — ATENOLOL 50 MG PO TABS
50.0000 mg | ORAL_TABLET | Freq: Every day | ORAL | Status: DC
  Administered 2011-11-26 – 2011-12-01 (×6): 50 mg via ORAL
  Filled 2011-11-25 (×7): qty 1

## 2011-11-25 MED ORDER — ATENOLOL 50 MG PO TABS
50.0000 mg | ORAL_TABLET | Freq: Every day | ORAL | Status: DC
  Filled 2011-11-25: qty 1

## 2011-11-25 NOTE — Progress Notes (Addendum)
Physical Therapy Note  Patient Details  Name: [name] MRN: [id number] Date of Birth: 12/04/1928 Today's Date: 11/25/2011  0900-1000 (60 minutes) individual Pain: no complaint of pain Focus of treatment : Therapeutic activities focused on left inattention , self care, direction finding (left rlight discrimination)  Treatment: Pt brushing teeth at sink ; pt perseverates brushing activity and requires max cues to place cap on toothpaste ; pt requested to use bathroom , required max vcs to locate bathroom in hospital room; pt could not follow step to use zipper and urinate in standing; pt urinated in sitting but required assist to change under shorts (could not follow verbal or tactile cues); pt attempted to tie shoes but could not complete without assist; gait to gym close supervision with right and left vcs to locate gym; pt to place rings on cones on bedside table (right,middle, left); pt required max verbal and visual cues (L,M,R) to place ring on appropriate cone especially to left.; gait to room to locate 4029 ( pt unable to recognize numbers)   Harol Shabazz,JIM 11/25/2011, 10:48 AM

## 2011-11-25 NOTE — Progress Notes (Signed)
Speech Language Pathology Daily Session Note  Patient Details  Name: [name] MRN: [id number] Date of Birth: 02/03/1929  Today's Date: 11/25/2011 Time: 1308-6578 Time Calculation (min): 50 min  Short Term Goals: Week 1:  See Long-Term Goals  Skilled Therapeutic Interventions: Treatment focus was on educating patient's sons and wife regarding effective strategies such as semantic and phonemic cues to facilitate word finding difficulty as well as consider cogntive deficts attention and inititaion when requesting him to complete BADLs.  Patient required mod-max cues to answer biographical questions with SLP and family cuing.  Patient also required mod-max assist multi-modal cues to initiate functional tasks throughout session.  Family expressed concern with provideding 24/7 supervision upon discharge and was requesting information about their options.  SLP recommended that they further discuss their questions with nurse case manager and social worker on Monday.    FIM:  Comprehension Comprehension Mode: Auditory Comprehension: 3-Understands basic 50 - 74% of the time/requires cueing 25 - 50%  of the time Expression Expression Mode: Verbal Expression: 2-Expresses basic 25 - 49% of the time/requires cueing 50 - 75% of the time. Uses single words/gestures. Social Interaction Social Interaction: 2-Interacts appropriately 25 - 49% of time - Needs frequent redirection. Problem Solving Problem Solving: 2-Solves basic 25 - 49% of the time - needs direction more than half the time to initiate, plan or complete simple activities Memory Memory: 1-Recognizes or recalls less than 25% of the time/requires cueing greater than 75% of the time  Pain Pain Assessment Pain Assessment: No/denies pain Pain Score: 0-No pain  Therapy/Group: Individual Therapy  Charlane Ferretti., CCC-SLP [phone]  Ithiel Liebler 11/25/2011, 4:15 PM

## 2011-11-25 NOTE — Progress Notes (Signed)
Patient ID: [name], male   DOB: 04-25-1929, 78 y.o.   MRN: [id number] Patient ID: [name], male   DOB: Mar 17, 1929, 78 y.o.   MRN: [id number] Subjective/Complaints: 78 y.o. right-handed male with history of atrial fibrillation/pacemaker on chronic Coumadin therapy as well as documented dementia and TIA 06/2011 admitted 11/18/2011 with left-sided weakness and numbness and left-sided visual difficulty as well as altered mental status. Patient independent prior to admission per wife and driving. Cranial CT scan 11/18/2011 showed no acute intracranial abnormalities. His INR on admission was 3.02.  Echocardiogram with ejection fraction of 45% without embolus. Recent carotid Doppler study showed no ICA stenosis. A followup cranial CT scan 11/20/2011 showed new 7 x 10 mm hemorrhagic focus right parietal region with mild surrounding edema not present previously. Suspect acute nonhemorrhagic right occipital infarct not visualized previously. MRI was not done due to pacemaker. Neurology had been consulted 11/20/2011 Coumadin therapy discontinued secondary to hemorrhagic transformation and plan to begin aspirin therapy 81 mg daily once INR less than 2.00Hematology consulted due to persistent leukocytosis felt related to urinate tract infection with workup unremarkable felt to be reactive in nature with workup ongoing to rule out myeloproliferative disorder  No problems reported - pt denies pain,   ROS No specific complaints  Objective: Vital Signs: Blood pressure 99/68, pulse 74, temperature 98 F (36.7 C), temperature source Oral, resp. rate 20, height 6\' 5"  (1.956 m), weight 184 lb 1.4 oz (83.5 kg), SpO2 95.00%. Assessment/Plan: 1. Functional deficits secondary to R parieto occipital hemorrhage due to coagulopathy  which require 3+ hours per day of interdisciplinary therapy in a comprehensive inpatient rehab setting. Physiatrist is providing close team supervision and 24 hour management of active  medical problems listed below. Physiatrist and rehab team continue to assess barriers to discharge/monitor patient progress toward functional and medical goals. FIM: FIM - Bathing Bathing Steps Patient Completed: Chest;Right Arm;Left Arm;Abdomen;Buttocks;Right upper leg;Left upper leg;Front perineal area;Right lower leg (including foot);Left lower leg (including foot) Bathing: 4: Steadying assist  FIM - Upper Body Dressing/Undressing Upper body dressing/undressing steps patient completed: Pull shirt over trunk;Thread/unthread right sleeve of pullover shirt/dresss Upper body dressing/undressing: 3: Mod-Patient completed 50-74% of tasks FIM - Lower Body Dressing/Undressing Lower body dressing/undressing steps patient completed: Pull underwear up/down;Thread/unthread right pants leg;Pull pants up/down;Thread/unthread right underwear leg;Thread/unthread left underwear leg;Don/Doff right sock;Don/Doff left sock;Don/Doff right shoe;Don/Doff left shoe Lower body dressing/undressing: 3: Mod-Patient completed 50-74% of tasks  FIM - Toileting Toileting steps completed by patient: Adjust clothing prior to toileting;Performs perineal hygiene Toileting Assistive Devices: Grab bar or rail for support Toileting: 3: Mod-Patient completed 2 of 3 steps  FIM - Diplomatic Services operational officer Devices: Grab bars Toilet Transfers: 3-To toilet/BSC: Mod A (lift or lower assist);3-From toilet/BSC: Mod A (lift or lower assist)  FIM - Bed/Chair Transfer Bed/Chair Transfer: 4: Bed > Chair or W/C: Min A (steadying Pt. > 75%);4: Chair or W/C > Bed: Min A (steadying Pt. > 75%)  FIM - Locomotion: Wheelchair Locomotion: Wheelchair: 0: Activity did not occur FIM - Locomotion: Ambulation Ambulation/Gait Assistance: 4: Min assist Locomotion: Ambulation: 4: Travels 150 ft or more with minimal assistance (Pt.>75%)  Comprehension Comprehension Mode: Auditory Comprehension: 3-Understands basic 50 - 74% of the  time/requires cueing 25 - 50%  of the time  Expression Expression Mode: Verbal Expression: 2-Expresses basic 25 - 49% of the time/requires cueing 50 - 75% of the time. Uses single words/gestures.  Social Interaction Social Interaction: 3-Interacts appropriately 50 - 74% of  the time - May be physically or verbally inappropriate.  Problem Solving Problem Solving: 2-Solves basic 25 - 49% of the time - needs direction more than half the time to initiate, plan or complete simple activities  Memory Memory: 1-Recognizes or recalls less than 25% of the time/requires cueing greater than 75% of the time  Medical Problem List and Plan:  1. 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 DVT Prophylaxis/Anticoagulation: SCDs. Monitor for any signs of DVT  3. Leukocytosis: Latest WBC of 15,800. Followup per hematology services. Workup thus far unremarkable felt to be reactive in nature. 4. Atrial fibrillation/pacemaker. Coumadin discontinued secondary hemorrhagic infarct. Now on ASA Lab Results  Component Value Date   INR 1.41 11/25/2011   INR 1.55* 11/24/2011   INR 1.81* 11/23/2011   PROTIME 25.9* 11/19/2010   PROTIME 20.9 11/17/2008    5. Dementia. Continue Aricept and Namenda. Speech therapy to provide evaluation. Will discuss baseline cognition with wife  6. History BPH. Check PVRs x3. Latest urine study showed no growth    LOS (Days) 3 A FACE TO FACE EVALUATION WAS PERFORMED  [name] 11/25/2011, 8:14 AM

## 2011-11-25 NOTE — Progress Notes (Signed)
Occupational Therapy Session Note  Patient Details  Name: [name] MRN: [id number] Date of Birth: 12-27-1928  Today's Date: 11/25/2011 Time: 0805-0900 Time Calculation (min): 55 min  Short Term Goals: Week 1:  OT Short Term Goal 1 (Week 1): Pt will complete bathing with intermittent cues with supervision/setup assist OT Short Term Goal 2 (Week 1): Pt will complete UB dressing with intermittent cues with supervision/setup assist OT Short Term Goal 3 (Week 1): Pt will complete grooming with supervision OT Short Term Goal 4 (Week 1): Pt will complete toilet transfer with supervision  Skilled Therapeutic Interventions/Progress Updates:    Pt seen for ADL retraining with focus on self-feeding and dressing.  Pt requires max cues for initiation, sequencing, and Lt attention.  Upon entering pt sitting upright in bed with untouched tray in front of him.  Pt required setup assist and cues to initiate self-feeding and mod questioning cues to remain engaged in feeding in otherwise non-distracting environment.  Encouraged pt to scan to Lt side of tray to obtain juice.  Pt attempted to don shirt through outside of sleeve first, with setup/orientation of shirt and max cues pt able to don both arms and then stated that he was done.  Context and questioning cues to initiate pulling shirt over head and trunk.  Engaged in brushing teeth in standing at sink with cues to obtain toothbrush and toothpaste.  Therapy Documentation Precautions:  Precautions Precautions: Fall Precaution Comments: h/o dementia, L inattention; D3 diet with thin liquids, intermittent supervision Restrictions Weight Bearing Restrictions: No General:   Vital Signs: Therapy Vitals Temp: 98 F (36.7 C) Temp src: Oral Pulse Rate: 74  Resp: 20  BP: 99/68 mmHg Patient Position, if appropriate: Lying Oxygen Therapy SpO2: 95 % O2 Device: None (Room air) Pain: Pain Assessment Pain Assessment: No/denies pain  See FIM for  current functional status  Therapy/Group: Individual Therapy  Leonette Monarch 11/25/2011, 9:17 AM

## 2011-11-26 LAB — P190 BCR-ABL 1: P190 BCR ABL1: NOT DETECTED

## 2011-11-26 LAB — BCR/ABL GENE REARRANGEMENT QNT, PCR
BCR ABL1 / ABL1: 0 not reported
BCR/ABL Source: 0
Prior result - BCRQ: 0

## 2011-11-26 LAB — P210 BCR-ABL 1: P210 BCR ABL1: NOT DETECTED

## 2011-11-26 NOTE — Progress Notes (Signed)
Confused, alert and oriented to self only. Quick release belt in chair for decreased safety awareness. Bed alarm in use. Left inattention. Decreased attention and focus requiring cues at times to complete tasks. Tawanna Solo

## 2011-11-26 NOTE — Progress Notes (Signed)
Patient ID: [name], male   DOB: 1928/08/18, 78 y.o.   MRN: [id number]   Subjective/Complaints: 78 y.o. right-handed male with history of atrial fibrillation/pacemaker on chronic Coumadin therapy as well as documented dementia and TIA 06/2011 admitted 11/18/2011 with left-sided weakness and numbness and left-sided visual difficulty as well as altered mental status. Patient independent prior to admission per wife and driving. Cranial CT scan 11/18/2011 showed no acute intracranial abnormalities. His INR on admission was 3.02.  Echocardiogram with ejection fraction of 45% without embolus. Recent carotid Doppler study showed no ICA stenosis. A followup cranial CT scan 11/20/2011 showed new 7 x 10 mm hemorrhagic focus right parietal region with mild surrounding edema not present previously. Suspect acute nonhemorrhagic right occipital infarct not visualized previously. MRI was not done due to pacemaker. Neurology had been consulted 11/20/2011 Coumadin therapy discontinued secondary to hemorrhagic transformation and plan to begin aspirin therapy 81 mg daily once INR less than 2.00Hematology consulted due to persistent leukocytosis felt related to urinate tract infection with workup unremarkable felt to be reactive in nature with workup ongoing to rule out myeloproliferative disorder  No problems reported - pt denies pain, he is eating breakfast  ROS No specific complaints  Objective: Vital Signs: Blood pressure 120/73, pulse 71, temperature 98.1 F (36.7 C), temperature source Oral, resp. rate 19, height 6\' 5"  (1.956 m), weight 184 lb 1.4 oz (83.5 kg), SpO2 94.00%.   male in no acute distress. HEENT exam atraumatic, normocephalic, neck supple without jugular venous distention. Chest clear to auscultation cardiac exam S1-S2 are regular. Abdominal exam overweight with bowel sounds, soft and nontender. Extremities no edema. Neurologic exam is alert with a normal gait.  Assessment/Plan: 1. Functional  deficits secondary to R par[place]o occipital hemorrhage due to coagulopathy  which require 3+ hours per day of interdisciplinary therapy in a comprehensive inpatient rehab setting. Physiatrist is providing close team supervision and 24 hour management of active medical problems listed below. Physiatrist and rehab team continue to assess barriers to discharge/monitor patient progress toward functional and medical goals. FIM: FIM - Bathing Bathing Steps Patient Completed: Chest;Right Arm;Left Arm;Abdomen;Buttocks;Right upper leg;Left upper leg;Front perineal area;Right lower leg (including foot);Left lower leg (including foot) Bathing: 4: Steadying assist  FIM - Upper Body Dressing/Undressing Upper body dressing/undressing steps patient completed: Thread/unthread right sleeve of pullover shirt/dresss;Thread/unthread left sleeve of pullover shirt/dress Upper body dressing/undressing: 3: Mod-Patient completed 50-74% of tasks FIM - Lower Body Dressing/Undressing Lower body dressing/undressing steps patient completed: Thread/unthread right underwear leg;Thread/unthread left underwear leg;Pull underwear up/down;Thread/unthread right pants leg;Thread/unthread left pants leg;Pull pants up/down;Don/Doff right shoe;Don/Doff left shoe Lower body dressing/undressing: 3: Mod-Patient completed 50-74% of tasks  FIM - Toileting Toileting steps completed by patient: Performs perineal hygiene Toileting Assistive Devices: Grab bar or rail for support Toileting: 6: More than reasonable amount of time  FIM - Diplomatic Services operational officer Devices: Grab bars Toilet Transfers: 3-To toilet/BSC: Mod A (lift or lower assist);3-From toilet/BSC: Mod A (lift or lower assist)  FIM - Bed/Chair Transfer Bed/Chair Transfer: 4: Supine > Sit: Min A (steadying Pt. > 75%/lift 1 leg);4: Bed > Chair or W/C: Min A (steadying Pt. > 75%);4: Chair or W/C > Bed: Min A (steadying Pt. > 75%)  FIM - Locomotion:  Wheelchair Locomotion: Wheelchair: 0: Activity did not occur FIM - Locomotion: Ambulation Ambulation/Gait Assistance: 4: Min assist Locomotion: Ambulation: 4: Travels 150 ft or more with minimal assistance (Pt.>75%)  Comprehension Comprehension Mode: Auditory Comprehension: 3-Understands basic 50 - 74% of the  time/requires cueing 25 - 50%  of the time  Expression Expression Mode: Verbal Expression: 2-Expresses basic 25 - 49% of the time/requires cueing 50 - 75% of the time. Uses single words/gestures.  Social Interaction Social Interaction: 2-Interacts appropriately 25 - 49% of time - Needs frequent redirection.  Problem Solving Problem Solving: 2-Solves basic 25 - 49% of the time - needs direction more than half the time to initiate, plan or complete simple activities  Memory Memory: 1-Recognizes or recalls less than 25% of the time/requires cueing greater than 75% of the time  Medical Problem List and Plan:  1. 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 DVT Prophylaxis/Anticoagulation: SCDs. Monitor for any signs of DVT  3. Leukocytosis: Latest WBC of 15,800. Followup per hematology services. Workup thus far unremarkable felt to be reactive in nature. 4. Atrial fibrillation/pacemaker. Coumadin discontinued secondary hemorrhagic infarct. Now on ASA 5. Dementia. Continue Aricept and Namenda. Speech therapy to provide evaluation. Will discuss baseline cognition with wife  6. History BPH.    LOS (Days) 4 A FACE TO FACE EVALUATION WAS PERFORMED  [name] 11/26/2011, 9:25 AM

## 2011-11-26 NOTE — Progress Notes (Signed)
Occupational Therapy Session Note  Patient Details  Name: [name] MRN: [id number] Date of Birth: 03-05-1929  Today's Date: 11/26/2011 Time: 0900-0958 Time Calculation (min): 58 min  Short Term Goals: Week 1:  OT Short Term Goal 1 (Week 1): Pt will complete bathing with intermittent cues with supervision/setup assist OT Short Term Goal 2 (Week 1): Pt will complete UB dressing with intermittent cues with supervision/setup assist OT Short Term Goal 3 (Week 1): Pt will complete grooming with supervision OT Short Term Goal 4 (Week 1): Pt will complete toilet transfer with supervision  Skilled Therapeutic Interventions/Progress Updates: ADL-retraining at walk-in shower with emphasis on safety, family ed (spouse and son [name] present), attention (to left), initiation and termination of activity, problem-solving and safe and effective use of DME.  Patient able to stand to urinate at toilet and then walk into shower with supervision using grab bars, as needed.  Pateint completed grooming, standing at sink for hair and oral care.  Throughout tasks, patient demo's good dynamic standing balance and endurance as evidenced by ability to stand to wash LE and feet and complete all bathing and grooming standing.   Patient demo's mild perserveration with tasks, particularly brushing his teeth, and is not able to problem-solve when confronted with obstacles (ex: tub bench).  OT advised patient's spouse on need for supervision of patient during ADL.     Therapy Documentation Precautions:  Precautions Precautions: Fall Precaution Comments: h/o dementia, L inattention; D3 diet with thin liquids, intermittent supervision Restrictions Weight Bearing Restrictions: No  Pain: No pain   ADL: ADL Grooming: Minimal assistance Where Assessed-Grooming: Sitting at sink Upper Body Bathing: Maximal cueing;Minimal assistance Where Assessed-Upper Body Bathing: Sitting at sink Lower Body Bathing: Moderate  assistance;Maximal cueing Where Assessed-Lower Body Bathing: Standing at sink Upper Body Dressing: Maximal assistance;Maximal cueing Where Assessed-Upper Body Dressing: Sitting at sink Lower Body Dressing: Moderate assistance;Moderate cueing Where Assessed-Lower Body Dressing: Sitting at sink;Standing at sink Toileting: Moderate assistance Where Assessed-Toileting: Teacher, adult education: Minimal Dentist Method: Ambulating ADL Comments: Pt requires max cues for initiation and sequencing of bathing, setup with washcloth and soap, and min cues for Lt side attention  See FIM for current functional status  Therapy/Group: Individual Therapy  Georgeanne Nim 11/26/2011, 3:02 PM

## 2011-11-27 LAB — PROTIME-INR
INR: 1.26 (ref 0.00–1.49)
Prothrombin Time: 16.1 seconds — ABNORMAL HIGH (ref 11.6–15.2)

## 2011-11-27 MED ORDER — BOOST PLUS PO LIQD
237.0000 mL | Freq: Three times a day (TID) | ORAL | Status: DC
  Administered 2011-11-27 – 2011-12-01 (×10): 237 mL via ORAL
  Filled 2011-11-27 (×16): qty 237

## 2011-11-27 MED ORDER — METHYLPHENIDATE HCL 5 MG PO TABS
5.0000 mg | ORAL_TABLET | Freq: Two times a day (BID) | ORAL | Status: DC
  Administered 2011-11-27 – 2011-12-01 (×8): 5 mg via ORAL
  Filled 2011-11-27 (×8): qty 1

## 2011-11-27 NOTE — Progress Notes (Signed)
Speech Language Pathology Daily Session Note  Patient Details  Name: [name] MRN: [id number] Date of Birth: Jul 01, 1928  Today's Date: 11/27/2011 Time: 6213-0865 Time Calculation (min): 42 min  Short Term Goals: Week 1:  See long term goals  Skilled Therapeutic Interventions: Treatmetn focus on initiating and problem solving with self feedig task.  Patient required max-total assist sematnic and tactile cues to initiate and problem solve set up of tray as well as total assit cues to self feed due to distraction of rain outside.  SLP had to verballiy provide step by step cues to self feed: pick up fork, take a bite of pancake , etc.  Paitent was mod i with initation and attention to sips of coffee and demonstrated no overt s/s of aspiration with regalr textures and thin liquids via cup.    FIM:  Comprehension Comprehension Mode: Auditory Comprehension: 3-Understands basic 50 - 74% of the time/requires cueing 25 - 50%  of the time Expression Expression Mode: Verbal Expression: 2-Expresses basic 25 - 49% of the time/requires cueing 50 - 75% of the time. Uses single words/gestures. Social Interaction Social Interaction: 2-Interacts appropriately 25 - 49% of time - Needs frequent redirection. Problem Solving Problem Solving: 1-Solves basic less than 25% of the time - needs direction nearly all the time or does not effectively solve problems and may need a restraint for safety Memory Memory: 1-Recognizes or recalls less than 25% of the time/requires cueing greater than 75% of the time FIM - Eating Eating Activity: 5: Set-up assist for open containers;5: Set-up assist for cut food;5: Needs verbal cues/supervision  Pain Pain Assessment Pain Assessment: No/denies pain Pain Score: 0-No pain  Therapy/Group: Individual Therapy  Charlane Ferretti., CCC-SLP [phone]  [name] 11/27/2011, 11:05 AM

## 2011-11-27 NOTE — Progress Notes (Signed)
Occupational Therapy Session Note  Patient Details  Name: [name] MRN: [id number] Date of Birth: 07-28-1928  Today's Date: 11/27/2011 Time: 1332-1400 Time Calculation (min): 28 min  Short Term Goals: Week 1:  OT Short Term Goal 1 (Week 1): Pt will complete bathing with intermittent cues with supervision/setup assist OT Short Term Goal 2 (Week 1): Pt will complete UB dressing with intermittent cues with supervision/setup assist OT Short Term Goal 3 (Week 1): Pt will complete grooming with supervision OT Short Term Goal 4 (Week 1): Pt will complete toilet transfer with supervision  Skilled Therapeutic Interventions/Progress Updates:    1:1 OT with focus on functional mobility, initiation, sequencing, and Lt attention in gross and fine motor activities.  Engaged in putting activity with focus on initiation of familiar task and verbalizing scoring and sequencing of sport.  Pt with decreased ability to locate items on left.  [facility] activity with pegboard to encourage left attention and scanning to left to obtain all pegs.  Pt demonstrated increased scanning to Lt during table top activity.  Therapy Documentation Precautions:  Precautions Precautions: Fall Precaution Comments: h/o dementia, L inattention; D3 diet with thin liquids, intermittent supervision Restrictions Weight Bearing Restrictions: No Pain:  Pt with no c/o pain this session.  See FIM for current functional status  Therapy/Group: Individual Therapy  Leonette Monarch 11/27/2011, 3:24 PM

## 2011-11-27 NOTE — Progress Notes (Signed)
Restless during night. Attempted to get out of bed without assistance. Bedalarm alerted staff. Brought to nurses station at 0200. Back to bed at 0330. [name]

## 2011-11-27 NOTE — Progress Notes (Addendum)
Occupational Therapy Session Note  Patient Details  Name: [name] MRN: [id number] Date of Birth: 1929/05/20  Today's Date: 11/27/2011 Time: 0730-0830 Time Calculation (min): 60 min  Short Term Goals: Week 1:  OT Short Term Goal 1 (Week 1): Pt will complete bathing with intermittent cues with supervision/setup assist OT Short Term Goal 2 (Week 1): Pt will complete UB dressing with intermittent cues with supervision/setup assist OT Short Term Goal 3 (Week 1): Pt will complete grooming with supervision OT Short Term Goal 4 (Week 1): Pt will complete toilet transfer with supervision  Skilled Therapeutic Interventions/Progress Updates:  ADL performed this morning. Patient in bed upon arrival. Pt agreed to take shower this AM. No reports of pain. Shower t/f: Min A for steadying assist only; walk-in shower with shower seat. Assist to adjust water temp. UBB/LBB: SBA with mod vc's to complete and terminate. Patient walked to bed to dress with Min A for safety, no DME used. Patient needed assist with UBD: putting left arm in sleeve versus head hole. Pt required assistance to button top shirt button. LBD: Patient required assistance to with donning ted hose, socks, tying shoes, threading right leg in boxer leg hole and right leg in right pant leg. Patient required redirection to task 50% of the time. Functional t/f: bed to w/c: SBA  Therapy Documentation Precautions:  Precautions Precautions: Fall Precaution Comments: h/o dementia, L inattention; D3 diet with thin liquids, intermittent supervision Restrictions Weight Bearing Restrictions: No Pain: No reports of pain.    See FIM for current functional status  Therapy/Group: Individual Therapy  Dennison Mcdaid, Charisse March 11/27/2011, 8:43 AM  Addendum: Removed ADL portion of note. Information not accurate. See FIM for current functional status.

## 2011-11-27 NOTE — Progress Notes (Deleted)
Eating small frequent snacks. Likes v-8 juice. Taking meds whole in applesauce.  PRN trazodone 50mg  given at hs and effective. Tawanna Solo

## 2011-11-27 NOTE — Progress Notes (Signed)
Physical Therapy Session Note  Patient Details  Name: KANAN SOBEK MRN: [id number] Date of Birth: 04-04-1929  Today's Date: 11/27/2011 Time: 4098-1191 Time Calculation (min): 60 min  Short Term Goals: Week 1:  PT Short Term Goal 1 (Week 1): = LTG secondary to LOS  Skilled Therapeutic Interventions/Progress Updates:   Patient finishing breakfast; standing balance and problem solving during daily functional activity of brushing teeth at sink with tooth brush and paste on L side of sink; patient required max-total visual and questioning cues to initiate, to find tooth brush and paste on L side of sink and sequence putting paste on brush and brushing teeth completely.    Assisted patient to mat on floor; had patient practice floor > furniture transfer with close supervision and mod questioning cues to assist patient with initiating and sequencing transferring from prone > quadruped > tall and half kneeling to stand from floor with use of LUE and LLE.  Gait training during dual task of attention to carrying "grocery bag" in L UE while negotiating 2 steps to enter house with R rail and problem solving switching "bag" to other hand to allow other UE to hold rail; max-total questioning cues first trip up/down stairs, min cues 2 trip up/down stairs; patient switched "bag" automatically second time. Continued dynamic standing balance, dual task gait training with attention to task and L side with ball bouncing during forward, retro and L lateral stepping with supervision for safety.  Therapy Documentation Precautions:  Precautions Precautions: Fall Precaution Comments: h/o dementia, L inattention; D3 diet with thin liquids, intermittent supervision Restrictions Weight Bearing Restrictions: No Pain: Pain Assessment Pain Assessment: No/denies pain Pain Score: 0-No pain  See FIM for current functional status  Therapy/Group: Individual Therapy  Edman Circle Faucette 11/27/2011, 12:10 PM

## 2011-11-27 NOTE — Progress Notes (Signed)
Patient ID: [name], male   DOB: Nov 08, 1928, 78 y.o.   MRN: [id number] Subjective/Complaints: 78 y.o. right-handed male with history of atrial fibrillation/pacemaker on chronic Coumadin therapy as well as documented dementia and TIA 06/2011 admitted 11/18/2011 with left-sided weakness and numbness and left-sided visual difficulty as well as altered mental status. Patient independent prior to admission per wife and driving. Cranial CT scan 11/18/2011 showed no acute intracranial abnormalities. His INR on admission was 3.02.  Echocardiogram with ejection fraction of 45% without embolus. Recent carotid Doppler study showed no ICA stenosis. A followup cranial CT scan 11/20/2011 showed new 7 x 10 mm hemorrhagic focus right parietal region with mild surrounding edema not present previously. Suspect acute nonhemorrhagic right occipital infarct not visualized previously. MRI was not done due to pacemaker. Neurology had been consulted 11/20/2011 Coumadin therapy discontinued secondary to hemorrhagic transformation and plan to begin aspirin therapy 81 mg daily once INR less than 2.00Hematology consulted due to persistent leukocytosis felt related to urinate tract infection with workup unremarkable felt to be reactive in nature with workup ongoing to rule out myeloproliferative disorder  No problems reported but pt with limited insight related to dementia and CVA  Review of Systems  Psychiatric/Behavioral: Positive for memory loss.  All other systems reviewed and are negative.     Objective: Vital Signs: Blood pressure 107/62, pulse 62, temperature 98 F (36.7 C), temperature source Oral, resp. rate 19, height 6\' 5"  (1.956 m), weight 83.5 kg (184 lb 1.4 oz), SpO2 96.00%. No results found. Results for orders placed during the hospital encounter of 11/22/11 (from the past 72 hour(s))  PROTIME-INR     Status: Abnormal   Collection Time   11/25/11  6:45 AM      Component Value Range Comment   Prothrombin  Time 17.5 (*) 11.6 - 15.2 (seconds)    INR 1.41  0.00 - 1.49    PROTIME-INR     Status: Abnormal   Collection Time   11/26/11  6:30 AM      Component Value Range Comment   Prothrombin Time 16.3 (*) 11.6 - 15.2 (seconds)    INR 1.29  0.00 - 1.49    PROTIME-INR     Status: Abnormal   Collection Time   11/27/11  7:10 AM      Component Value Range Comment   Prothrombin Time 16.1 (*) 11.6 - 15.2 (seconds)    INR 1.26  0.00 - 1.49       HEENT: normal Cardio: RRR Resp: CTA B/L GI: BS positive Extremity:  No Edema Skin:   Intact Neuro: Confused and Abnormal Motor 4/5 in BUE and BLE Musc/Skel:  Normal L neglect on confrontation testing  Assessment/Plan: 1. Functional deficits secondary to R parieto occipital hemorrhage due to coagulopathy  which require 3+ hours per day of interdisciplinary therapy in a comprehensive inpatient rehab setting. Physiatrist is providing close team supervision and 24 hour management of active medical problems listed below. Physiatrist and rehab team continue to assess barriers to discharge/monitor patient progress toward functional and medical goals. FIM: FIM - Bathing Bathing Steps Patient Completed: Chest;Right Arm;Left Arm;Abdomen;Front perineal area;Buttocks;Right upper leg;Left upper leg;Right lower leg (including foot);Left lower leg (including foot) Bathing: 5: Supervision: Safety issues/verbal cues  FIM - Upper Body Dressing/Undressing Upper body dressing/undressing steps patient completed: Thread/unthread right sleeve of pullover shirt/dresss;Thread/unthread left sleeve of pullover shirt/dress;Put head through opening of pull over shirt/dress;Pull shirt over trunk;Button/unbutton shirt Upper body dressing/undressing: 4: Min-Patient completed 75 plus % of  tasks (pt required assistance to place left arm in sleeve ) FIM - Lower Body Dressing/Undressing Lower body dressing/undressing steps patient completed: Thread/unthread right underwear  leg;Thread/unthread left underwear leg;Pull underwear up/down;Thread/unthread right pants leg;Thread/unthread left pants leg;Pull pants up/down;Fasten/unfasten pants;Don/Doff right sock;Don/Doff left sock;Don/Doff right shoe;Don/Doff left shoe;Fasten/unfasten right shoe;Fasten/unfasten left shoe Lower body dressing/undressing: 3: Mod-Patient completed 50-74% of tasks (Assist with tying shoes, fastening pants, right leg in pant)  FIM - Toileting Toileting steps completed by patient: Adjust clothing prior to toileting;Performs perineal hygiene;Adjust clothing after toileting Toileting Assistive Devices: Grab bar or rail for support Toileting: 5: Supervision: Safety issues/verbal cues  FIM - Diplomatic Services operational officer Devices: Grab bars Toilet Transfers: 5-To toilet/BSC: Supervision (verbal cues/safety issues);5-From toilet/BSC: Supervision (verbal cues/safety issues)  FIM - Bed/Chair Transfer Bed/Chair Transfer: 5: Set-up assist to: Apply orthosis/W/C set-up  FIM - Locomotion: Wheelchair Locomotion: Wheelchair: 0: Activity did not occur FIM - Locomotion: Ambulation Ambulation/Gait Assistance: 4: Min assist Locomotion: Ambulation: 4: Travels 150 ft or more with minimal assistance (Pt.>75%)  Comprehension Comprehension Mode: Auditory Comprehension: 3-Understands basic 50 - 74% of the time/requires cueing 25 - 50%  of the time  Expression Expression Mode: Verbal Expression: 2-Expresses basic 25 - 49% of the time/requires cueing 50 - 75% of the time. Uses single words/gestures.  Social Interaction Social Interaction: 2-Interacts appropriately 25 - 49% of time - Needs frequent redirection.  Problem Solving Problem Solving: 2-Solves basic 25 - 49% of the time - needs direction more than half the time to initiate, plan or complete simple activities  Memory Memory: 1-Recognizes or recalls less than 25% of the time/requires cueing greater than 75% of the time  Medical  Problem List and Plan:  1. 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 DVT Prophylaxis/Anticoagulation: SCDs. Monitor for any signs of DVT  3. Leukocytosis: Latest WBC of 15,800. Followup per hematology services. Workup thus far unremarkable felt to be reactive in nature. 4. Atrial fibrillation/pacemaker. Coumadin discontinued secondary hemorrhagic infarct.INR less than 2.00 off warfarin on ASA. Cardiac rate is controlled  5. Dementia. Continue Aricept and Namenda. Speech therapy to provide evaluation.Not oriented to place or time, had premorbid problems with time orientatation and attn to L but was reportedly still driving 6. History BPH. Check PVRs x3. Latest urine study showed no growth 7. Dispo: family concerned regarding 24/7 supervision,MSW to address possible SNF post D/C   LOS (Days) 5 A FACE TO FACE EVALUATION WAS PERFORMED  Rivaan Kendall E 11/27/2011, 8:50 AM

## 2011-11-28 NOTE — Progress Notes (Signed)
Recreational Therapy Session Note  Patient Details  Name: [name] MRN: [id number] Date of Birth: Jan 16, 1929 Today's Date: 11/28/2011 Time:  1300-1500 Pain: no c/o Skilled Therapeutic Interventions/Progress Updates: Pt referred for community reintegration/outing by team, thus "therapeutic day pass" received.  Pt participated in outing to [place] at ambulatory level with supervision for mobility and max cues for L inattention, LUE use, initiation, cognition, comprehension and expression.  Discussed pt's participation with wife upon return.  See outing goal sheet in paper chart for full details. Therapy/Group: [employer]  Aleshia Cartelli 11/28/2011, 4:29 PM

## 2011-11-28 NOTE — Progress Notes (Signed)
Physical Therapy Session Note  Patient Details  Name: [name] MRN: [id number] Date of Birth: [birth year]  Today's Date: 11/28/2011 Time: 4098-1191 Time Calculation (min): 44 min  Short Term Goals: Week 1:  PT Short Term Goal 1 (Week 1): = LTG secondary to LOS  Skilled Therapeutic Interventions/Progress Updates:  Patient presenting with decreased attention, decreased initiation, increased time to process and respond to cues and increased difficulty with expressive communication today; wife states patient was started on medicine yesterday to improve attention.   Therapy Documentation Precautions:  Precautions Precautions: Fall Precaution Comments: h/o dementia, L inattention; D3 diet with thin liquids, intermittent supervision Restrictions Weight Bearing Restrictions: No Pain: Pain Assessment Pain Assessment: No/denies pain Pain Score: 0-No pain Locomotion : Ambulation Ambulation/Gait Assistance: 5: Supervision on unit with intermittent verbal cues to attend to L environment and to maintain consistent gait speed; still experiences LOB with head turns. Gait training on treadmill x 7 min at 1.0 mph for >500 feet beginning with mod A to assist with full L step length, foot placement and anterior translation of COG by end of time patient taking full step and stride length with supervision; intermittent questioning cues to have patient attend to L environment while performing gait on treadmill; patient tends to ambulate with narrow BOS and slight LLE scissoring.  Continued gait training x 6 reps ambulation with WBOS with use of 2 x 4 as visual cue to increase LLE ABD with intermittent cues to attend to LLE position in distracting environment. Community gait training with stepping up and down one step curb without UE support but with min A for safety, sequence and balance; patient required verbal and visual cues for safe stepping sequence and for controlled lowering when descending.  See  FIM for current functional status  Therapy/Group: Individual Therapy  Edman Circle [facility] 11/28/2011, 10:39 AM

## 2011-11-28 NOTE — Progress Notes (Signed)
Speech Language Pathology Daily Session Note  Patient Details  Name: [name] MRN: [id number] Date of Birth: 13-Oct-1928  Today's Date: 11/28/2011 Time: 4098-1191 Time Calculation (min): 40 min  Short Term Goals: Week 1:  See long term goals  Skilled Therapeutic Interventions: Treatment focus on initiating and problem solving with self feedig task. Patient required max assist sematnic and tactile cues to initiate and problem solve set up of tray as well as max faded to mod assist cues to self feed, with patient aking 3 bites in a row wihout cues.  Level of cuing was directly related to environmental distractions.     FIM:  Comprehension Comprehension Mode: Auditory Comprehension: 3-Understands basic 50 - 74% of the time/requires cueing 25 - 50%  of the time Expression Expression Mode: Verbal Expression: 2-Expresses basic 25 - 49% of the time/requires cueing 50 - 75% of the time. Uses single words/gestures. Social Interaction Social Interaction: 2-Interacts appropriately 25 - 49% of time - Needs frequent redirection. Problem Solving Problem Solving: 2-Solves basic 25 - 49% of the time - needs direction more than half the time to initiate, plan or complete simple activities Memory Memory: 1-Recognizes or recalls less than 25% of the time/requires cueing greater than 75% of the time FIM - Eating Eating Activity: 5: Set-up assist for open containers;5: Set-up assist for cut food;5: Needs verbal cues/supervision  Pain Pain Assessment Pain Assessment: No/denies pain Pain Score: 0-No pain  Therapy/Group: Individual Therapy  Charlane Ferretti., CCC-SLP [phone]  Edessa Jakubowicz 11/28/2011, 1:47 PM

## 2011-11-28 NOTE — Progress Notes (Signed)
Patient ID: [name], male   DOB: 1929-01-29, 78 y.o.   MRN: [id number] Subjective/Complaints: 78 y.o. right-handed male with history of atrial fibrillation/pacemaker on chronic Coumadin therapy as well as documented dementia and TIA 06/2011 admitted 11/18/2011 with left-sided weakness and numbness and left-sided visual difficulty as well as altered mental status. Patient independent prior to admission per wife and driving. Cranial CT scan 11/18/2011 showed no acute intracranial abnormalities. His INR on admission was 3.02.  Echocardiogram with ejection fraction of 45% without embolus. Recent carotid Doppler study showed no ICA stenosis. A followup cranial CT scan 11/20/2011 showed new 7 x 10 mm hemorrhagic focus right parietal region with mild surrounding edema not present previously. Suspect acute nonhemorrhagic right occipital infarct not visualized previously. MRI was not done due to pacemaker. Neurology had been consulted 11/20/2011 Coumadin therapy discontinued secondary to hemorrhagic transformation and plan to begin aspirin therapy 81 mg daily once INR less than 2.00Hematology consulted due to persistent leukocytosis felt related to urinate tract infection with workup unremarkable felt to be reactive in nature with workup ongoing to rule out myeloproliferative disorder  No problems reported but pt with limited insight related to dementia and CVA  Review of Systems  Psychiatric/Behavioral: Positive for memory loss.  All other systems reviewed and are negative.     Objective: Vital Signs: Blood pressure 129/79, pulse 77, temperature 98.4 F (36.9 C), temperature source Oral, resp. rate 17, height 6\' 5"  (1.956 m), weight 83.5 kg (184 lb 1.4 oz), SpO2 95.00%. No results found. Results for orders placed during the hospital encounter of 11/22/11 (from the past 72 hour(s))  PROTIME-INR     Status: Abnormal   Collection Time   11/26/11  6:30 AM      Component Value Range Comment   Prothrombin  Time 16.3 (*) 11.6 - 15.2 (seconds)    INR 1.29  0.00 - 1.49    PROTIME-INR     Status: Abnormal   Collection Time   11/27/11  7:10 AM      Component Value Range Comment   Prothrombin Time 16.1 (*) 11.6 - 15.2 (seconds)    INR 1.26  0.00 - 1.49       HEENT: normal Cardio: RRR Resp: CTA B/L GI: BS positive Extremity:  No Edema Skin:   Intact Neuro: Confused and Abnormal Motor 4/5 in BUE and BLE Musc/Skel:  Normal L neglect on confrontation testing Decreased sensation on L side Assessment/Plan: 1. Functional deficits secondary to R parieto occipital hemorrhage due to coagulopathy  which require 3+ hours per day of interdisciplinary therapy in a comprehensive inpatient rehab setting. Physiatrist is providing close team supervision and 24 hour management of active medical problems listed below. Physiatrist and rehab team continue to assess barriers to discharge/monitor patient progress toward functional and medical goals. FIM: FIM - Bathing Bathing Steps Patient Completed: Chest;Right Arm;Left Arm;Abdomen;Front perineal area;Buttocks;Right upper leg;Left upper leg;Right lower leg (including foot);Left lower leg (including foot) Bathing: 5: Supervision: Safety issues/verbal cues  FIM - Upper Body Dressing/Undressing Upper body dressing/undressing steps patient completed: Thread/unthread right sleeve of pullover shirt/dresss;Thread/unthread left sleeve of pullover shirt/dress;Put head through opening of pull over shirt/dress;Pull shirt over trunk;Button/unbutton shirt Upper body dressing/undressing: 4: Min-Patient completed 75 plus % of tasks (pt required assistance to place left arm in sleeve ) FIM - Lower Body Dressing/Undressing Lower body dressing/undressing steps patient completed: Thread/unthread right underwear leg;Thread/unthread left underwear leg;Pull underwear up/down;Thread/unthread right pants leg;Thread/unthread left pants leg;Pull pants up/down;Fasten/unfasten pants;Don/Doff  right sock;Don/Doff left  sock;Don/Doff right shoe;Don/Doff left shoe;Fasten/unfasten right shoe;Fasten/unfasten left shoe Lower body dressing/undressing: 3: Mod-Patient completed 50-74% of tasks (Assist with tying shoes, fastening pants, right leg in pant)  FIM - Toileting Toileting steps completed by patient: Adjust clothing prior to toileting;Performs perineal hygiene;Adjust clothing after toileting Toileting Assistive Devices: Grab bar or rail for support Toileting: 4: Steadying assist  FIM - Diplomatic Services operational officer Devices: Grab bars Toilet Transfers: 5-To toilet/BSC: Supervision (verbal cues/safety issues);5-From toilet/BSC: Supervision (verbal cues/safety issues)  FIM - Bed/Chair Transfer Bed/Chair Transfer: 5: Supine > Sit: Supervision (verbal cues/safety issues);5: Sit > Supine: Supervision (verbal cues/safety issues);5: Bed > Chair or W/C: Supervision (verbal cues/safety issues);5: Chair or W/C > Bed: Supervision (verbal cues/safety issues)  FIM - Locomotion: Wheelchair Locomotion: Wheelchair: 0: Activity did not occur FIM - Locomotion: Ambulation Ambulation/Gait Assistance: 5: Supervision Locomotion: Ambulation: 5: Travels 150 ft or more with supervision/safety issues  Comprehension Comprehension Mode: Auditory Comprehension: 3-Understands basic 50 - 74% of the time/requires cueing 25 - 50%  of the time  Expression Expression Mode: Verbal Expression: 2-Expresses basic 25 - 49% of the time/requires cueing 50 - 75% of the time. Uses single words/gestures.  Social Interaction Social Interaction: 3-Interacts appropriately 50 - 74% of the time - May be physically or verbally inappropriate.  Problem Solving Problem Solving: 2-Solves basic 25 - 49% of the time - needs direction more than half the time to initiate, plan or complete simple activities  Memory Memory: 2-Recognizes or recalls 25 - 49% of the time/requires cueing 51 - 75% of the time  Medical  Problem List and Plan:  1. 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 DVT Prophylaxis/Anticoagulation: SCDs. Monitor for any signs of DVT  3. Leukocytosis: Latest WBC of 15,800. Followup per hematology services. Workup thus far unremarkable felt to be reactive in nature. 4. Atrial fibrillation/pacemaker. Coumadin discontinued secondary hemorrhagic infarct.INR less than 2.00 off warfarin on ASA. Cardiac rate is controlled  5. Dementia. Continue Aricept and Namenda. Speech therapy to provide evaluation.Not oriented to place or time, had premorbid problems with time orientatation and attn to L but was reportedly still driving 6. History BPH. Check PVRs x3. Latest urine study showed no growth 7. Dispo: family concerned regarding 24/7 supervision,MSW to address possible SNF post D/C   LOS (Days) 6 A FACE TO FACE EVALUATION WAS PERFORMED  Pj Zehner E 11/28/2011, 8:19 AM

## 2011-11-28 NOTE — Progress Notes (Signed)
Occupational Therapy Session Note  Patient Details  Name: [name] MRN: [id number] Date of Birth: 1929-05-05  Today's Date: 11/28/2011 Time: 0730-0830 Time Calculation (min): 60 min  Short Term Goals: Week 1:  OT Short Term Goal 1 (Week 1): Pt will complete bathing with intermittent cues with supervision/setup assist OT Short Term Goal 2 (Week 1): Pt will complete UB dressing with intermittent cues with supervision/setup assist OT Short Term Goal 3 (Week 1): Pt will complete grooming with supervision OT Short Term Goal 4 (Week 1): Pt will complete toilet transfer with supervision  Skilled Therapeutic Interventions/Progress Updates:  ADL performed in shower this AM. Wife present during ADL. Walk-in shower used with shower seat and grab bar. Patient performed bathing with SBA requiring max vc's for sequencing and safety. UBD: Set-up required. Patient initially thread left arm into sleeve and through head. Therapist then placed shirt on patient's lap correctly and patient was able to donn shirt without cues. LBD: Min A (patient required max vc's for sequencing and assist with buttoning pants button and tying shoes). Sarah, OT spoke to wife about patient using elastic shoelaces or shoe button to assist in tying shoes. Wife verbalized understanding. Patient combed hair and needed cues to attend to left side of head.  Therapy Documentation Precautions:  Precautions Precautions: Fall Precaution Comments: h/o dementia, L inattention; D3 diet with thin liquids, intermittent supervision Restrictions Weight Bearing Restrictions: No Pain: No report of pain. See FIM for current functional status  Therapy/Group: Individual Therapy  Kaleb Linquist, Charisse March 11/28/2011, 8:44 AM

## 2011-11-28 NOTE — Progress Notes (Signed)
Occupational Therapy Session Note  Patient Details  Name: [name] MRN: [id number] Date of Birth: 12-Nov-1928  Today's Date: 11/28/2011 Time: 1300-1445 Time Calculation (min): 105 min    Skilled Therapeutic Interventions/Progress Updates:  Community Reintegration performed this date. Please Community outing goal sheet in shadow chart for goals and details of outing.  Outing with emphasis on LUE awareness and use, van transfer, safety awareness, environmental obstacles, social interaction, visual scanning, direction following, memory, problem solving, and attention to task.    Therapy Documentation Precautions:  Precautions Precautions: Fall Precaution Comments: h/o dementia, L inattention; D3 diet with thin liquids, intermittent supervision Restrictions Weight Bearing Restrictions: No Pain: No reports of pain. See FIM for current functional status  Therapy/Group: Individual Therapy  Andrew Soria, Charisse March 11/28/2011, 3:20 PM

## 2011-11-29 NOTE — Progress Notes (Signed)
Physical Therapy Weekly Progress Note  Patient Details  Name: [name] MRN: [id number] Date of Birth: 23-Mar-1929  Today's Date: 11/29/2011 Time: 4098-1191 and [id number] Time Calculation (min): 36 min and 44 min  Patient has met 4 of 8 long term goals.  Short term goals not set due to estimated length of stay.  Patient is currently supervision overall for bed mobility, basic transfers, gait in controlled environment and requires intermittent min A for stair negotiation and higher level gait training in distracting environment or with higher level challenges. Patient's level of assistance (physically and cognitively) increase as patient fatigues.    Patient continues to demonstrate the following deficits: decreased endurance, impaired functional strength LUE and LE, impaired attention to task and attention to L, impaired initiation, memory, apraxia, impaired dynamic standing balance and therefore will continue to benefit from skilled PT intervention to enhance overall performance with activity tolerance, balance, ability to compensate for deficits, functional use of  left upper extremity and left lower extremity, attention and awareness.  See Patient's Care Plan for progression toward long term goals.  Patient progressing toward long term goals.  Plan of care revisions: secondary to patient's continue cognitive and safety impairments patient's wife has decided to seek SNF placement in memory care at the same community where she will be moving into an independent living apartment..  Therapy Documentation Precautions:  Precautions Precautions: Fall Precaution Comments: h/o dementia, L inattention; D3 diet with thin liquids, intermittent supervision Restrictions Weight Bearing Restrictions: No Pain: Pain Assessment Pain Assessment: No/denies pain Pain Score: 0-No pain Faces Pain Scale: No hurt Locomotion : Ambulation Ambulation/Gait Assistance: 5: Supervision High Level Ambulation High  Level Ambulation: Side stepping;Backwards walking;Direction changes Side Stepping: to L with mod tactile cues for full weight shift and attention to L environment Backwards Walking: with mod tactile cues for full weight shift, sequencing and full step and stride length Direction Changes: Weaving L and R around 2 cones in figure 8 with mod A for attention to L and for sequencing turning to L   Balance:  Dynamic balance, attention L, visual scanning to L and sequencing training while standing on blue foam while performing a squat and reach to L with LUE to pick up horseshoes with mod A to shift weight to L and tossing horseshoe with RUE to stob in L visual field; had patient reach to floor to retrieve all horseshoes with cues to scan to L and use LUE to retrieve horseshoe Exercises:  General strengthening, endurance, coordination on Nustep at level 6 x 7 min with bilat UE and LE Other Treatments: Treatments Therapeutic Activity: Car transfer training in simulated "van"; patient accurately identified that he will enter car on passenger side but continued to require max-total verbal, questioning and visual cues for initiation and sequencing of entering/exiting car; no physical assistance needed to place LE into and out of car; wife present to observe cues needed for safe transfer.    See FIM for current functional status  Therapy/Group: Individual Therapy  Edman Circle [facility] 11/29/2011, 11:29 AM

## 2011-11-29 NOTE — Progress Notes (Signed)
Occupational Therapy Weekly Progress Note and Therapy Note  Patient Details  Name: [name] MRN: [id number] Date of Birth: 03-23-1929  Today's Date: 11/29/2011 Time: 0730-0830 Time Calculation (min): 60 min  Patient has met 5 of 5 short term goals.  Patient is currently progressing towards LTGs of overall Supervision.  Patient continues to demonstrate the following deficits: Left neglect, decreased BUE coordination, decreased thought processing and sequencing, decreased safety awareness, decreased LBD and grooming and therefore will continue to benefit from skilled OT intervention to enhance overall performance with BADL.  Patient progressing toward long term goals..  Continue plan of care.  OT Short Term Goals Week 1:  OT Short Term Goal 1 (Week 1): Pt will complete bathing with intermittent cues with supervision/setup assist. OT Short Term Goal 1 - Progress (Week 1): Met OT Short Term Goal 2 (Week 1): Pt will complete UB dressing with intermittent cues with supervision/setup assist  OT Short Term Goal 2 - Progress (Week 1): Met OT Short Term Goal 3 (Week 1): Pt will complete grooming with supervision  OT Short Term Goal 3 - Progress (Week 1): Partly met OT Short Term Goal 4 (Week 1): Pt will complete toilet transfer with supervision  OT Short Term Goal 4 - Progress (Week 1): Progressing toward goal Week 2:  OT Short Term Goal 1 (Week 2): Pt will complete grooming with supervision  OT Short Term Goal 2 (Week 2): Pt will complete toilet transfer with supervision  OT Short Term Goal 3 (Week 2): Patient will complete LBD with SBA.  Skilled Therapeutic Interventions/Progress Updates:  ADL re-training performed this AM. Patient agreeable to take shower. Walk-in shower transfer: SBA with grab bar and ETB. Patient performed UBB/LBB/UBD with SBA. LBD: Min A. Patient required assistance to fasten pants. Patient was given elastic shoelaces to increase independence with LBD. Patient educated  on use. Patient verbalized understanding. Patient shaved this AM with electric razor. Patient did require assistance at end of task for thoroughness. No complaints of pain.  Therapy Documentation Precautions:  Precautions Precautions: Fall Precaution Comments: h/o dementia, L inattention; D3 diet with thin liquids, intermittent supervision Restrictions Weight Bearing Restrictions: No Pain: Pain Assessment Pain Assessment: No/denies pain Pain Score: 0-No pain  See FIM for current functional status  Therapy/Group: Individual Therapy  Yarelli Decelles, Charisse March 11/29/2011, 8:48 AM

## 2011-11-29 NOTE — Progress Notes (Signed)
Social Work Patient ID: [name], male   DOB: Apr 23, 1929, 78 y.o.   MRN: [id number] Met with pt and wife to inform of team conference goals-close supervision and discharge this week.  Working on [employer] but actually not a resident there yet, in the process, completing paperwork for independent living. Have left a message with admission cordinator-Janey to discuss if this is an option.  Wife meeting with RN today. Son working on the Nature conservation officer.  Pt requires 24 hour care and continued rehab.  Would hope to start pt off in the SNF unit and then transition down to Memory Care.  Informed wife if this is not feasible in a realistic amount of time Maybe would need to pursue other SNF's.  Await return call from Admission Coordinator and continue to discuss with wife the plans.

## 2011-11-29 NOTE — Progress Notes (Signed)
Patient ID: [name], male   DOB: 11-25-1928, 78 y.o.   MRN: [id number] Subjective/Complaints: 78 y.o. right-handed male with history of atrial fibrillation/pacemaker on chronic Coumadin therapy as well as documented dementia and TIA 06/2011 admitted 11/18/2011 with left-sided weakness and numbness and left-sided visual difficulty as well as altered mental status. Patient independent prior to admission per wife and driving. Cranial CT scan 11/18/2011 showed no acute intracranial abnormalities. His INR on admission was 3.02.  Echocardiogram with ejection fraction of 45% without embolus. Recent carotid Doppler study showed no ICA stenosis. A followup cranial CT scan 11/20/2011 showed new 7 x 10 mm hemorrhagic focus right parietal region with mild surrounding edema not present previously. Suspect acute nonhemorrhagic right occipital infarct not visualized previously. MRI was not done due to pacemaker. Neurology had been consulted 11/20/2011 Coumadin therapy discontinued secondary to hemorrhagic transformation and plan to begin aspirin therapy 81 mg daily once INR less than 2.00Hematology consulted due to persistent leukocytosis felt related to urinate tract infection with workup unremarkable felt to be reactive in nature with workup ongoing to rule out myeloproliferative disorder  No problems reported but pt with limited insight related to dementia and CVA  Review of Systems  Psychiatric/Behavioral: Positive for memory loss.  All other systems reviewed and are negative.     Objective: Vital Signs: Blood pressure 123/59, pulse 78, temperature 98.1 F (36.7 C), temperature source Oral, resp. rate 18, height 6\' 5"  (1.956 m), weight 81.239 kg (179 lb 1.6 oz), SpO2 96.00%. No results found. Results for orders placed during the hospital encounter of 11/22/11 (from the past 72 hour(s))  PROTIME-INR     Status: Abnormal   Collection Time   11/27/11  7:10 AM      Component Value Range Comment   Prothrombin Time 16.1 (*) 11.6 - 15.2 seconds    INR 1.26  0.00 - 1.49      HEENT: normal Cardio: RRR Resp: CTA B/L GI: BS positive Extremity:  No Edema Skin:   Intact Neuro: Confused and Abnormal Motor 4/5 in BUE and BLE Musc/Skel:  Normal L neglect on confrontation testing Decreased sensation on L side  Assessment/Plan: 1. Functional deficits secondary to R parieto occipital hemorrhage due to coagulopathy  which require 3+ hours per day of interdisciplinary therapy in a comprehensive inpatient rehab setting. Physiatrist is providing close team supervision and 24 hour management of active medical problems listed below. Physiatrist and rehab team continue to assess barriers to discharge/monitor patient progress toward functional and medical goals. FIM: FIM - Bathing Bathing Steps Patient Completed: Chest;Right Arm;Left Arm;Abdomen;Front perineal area;Buttocks;Right upper leg;Left upper leg;Right lower leg (including foot);Left lower leg (including foot) Bathing: 5: Supervision: Safety issues/verbal cues  FIM - Upper Body Dressing/Undressing Upper body dressing/undressing steps patient completed: Thread/unthread right sleeve of pullover shirt/dresss;Thread/unthread left sleeve of pullover shirt/dress;Put head through opening of pull over shirt/dress;Pull shirt over trunk Upper body dressing/undressing: 5: Supervision: Safety issues/verbal cues FIM - Lower Body Dressing/Undressing Lower body dressing/undressing steps patient completed: Thread/unthread right underwear leg;Thread/unthread left underwear leg;Pull underwear up/down;Thread/unthread right pants leg;Thread/unthread left pants leg;Pull pants up/down;Fasten/unfasten pants;Don/Doff right shoe;Don/Doff left shoe Lower body dressing/undressing: 4: Min-Patient completed 75 plus % of tasks (assist with fastening pants)  FIM - Toileting Toileting steps completed by patient: Adjust clothing prior to toileting;Performs perineal  hygiene;Adjust clothing after toileting Toileting Assistive Devices: Grab bar or rail for support Toileting: 4: Steadying assist  FIM - Diplomatic Services operational officer Devices: Grab bars Toilet Transfers: 5-To toilet/BSC:  Supervision (verbal cues/safety issues);5-From toilet/BSC: Supervision (verbal cues/safety issues)  FIM - Press photographer Assistive Devices: Bed rails Bed/Chair Transfer: 5: Supine > Sit: Supervision (verbal cues/safety issues);5: Bed > Chair or W/C: Supervision (verbal cues/safety issues)  FIM - Locomotion: Wheelchair Locomotion: Wheelchair: 0: Activity did not occur FIM - Locomotion: Ambulation Ambulation/Gait Assistance: 5: Supervision Locomotion: Ambulation: 5: Travels 150 ft or more with supervision/safety issues  Comprehension Comprehension Mode: Auditory Comprehension: 3-Understands basic 50 - 74% of the time/requires cueing 25 - 50%  of the time  Expression Expression Mode: Verbal Expression: 2-Expresses basic 25 - 49% of the time/requires cueing 50 - 75% of the time. Uses single words/gestures.  Social Interaction Social Interaction: 2-Interacts appropriately 25 - 49% of time - Needs frequent redirection.  Problem Solving Problem Solving: 2-Solves basic 25 - 49% of the time - needs direction more than half the time to initiate, plan or complete simple activities  Memory Memory: 1-Recognizes or recalls less than 25% of the time/requires cueing greater than 75% of the time  Medical Problem List and Plan:  1. 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 DVT Prophylaxis/Anticoagulation: SCDs. Monitor for any signs of DVT  3. Leukocytosis: Latest WBC of 15,800. Followup per hematology services. Workup thus far unremarkable felt to be reactive in nature. 4. Atrial  fibrillation/pacemaker. Coumadin discontinued secondary hemorrhagic infarct.INR less than 2.00 off warfarin on ASA. Cardiac rate is controlled  5. Dementia. Continue Aricept and Namenda. Speech therapy to provide evaluation.Not oriented to place or time, had premorbid problems with time orientatation and attn to L but was reportedly still driving 6. History BPH. Check PVRs x3. Latest urine study showed no growth 7. Dispo: family concerned regarding 24/7 supervision,MSW to address possible SNF post D/C   LOS (Days) 7 A FACE TO FACE EVALUATION WAS PERFORMED  Dossie Swor E 11/29/2011, 9:56 AM

## 2011-11-29 NOTE — Plan of Care (Signed)
Problem: RH BLADDER ELIMINATION Goal: RH STG MANAGE BLADDER WITH ASSISTANCE STG Manage Bladder With min Assistance  Outcome: Not Progressing Timed Toileting suggested Q3 and PRN

## 2011-11-29 NOTE — Progress Notes (Signed)
Speech Language Pathology Daily Session Note  Patient Details  Name: [name] MRN: [id number] Date of Birth: 1928-06-22  Today's Date: 11/29/2011 Time: 4098-1191 Time Calculation (min): 45 min  Short Term Goals: Week 1:  See long term goals  Skilled Therapeutic Interventions: Treatment session focused on initiating and problem solving with self feedig task. SLP facilitated session with total assist cues to locate, problem solve use and initiate each bite or sip today with verbal, visual, tactile and demonstrative cues.  There were no exteral distractions present; however, patient appeared internally distracted during today's session often staring off into space after each baite and required cues to bring attention back to task each time.   FIM:  Comprehension Comprehension Mode: Auditory Comprehension: 2-Understands basic 25 - 49% of the time/requires cueing 51 - 75% of the time Expression Expression Mode: Verbal Expression: 2-Expresses basic 25 - 49% of the time/requires cueing 50 - 75% of the time. Uses single words/gestures. Social Interaction Social Interaction: 2-Interacts appropriately 25 - 49% of time - Needs frequent redirection. Problem Solving Problem Solving: 1-Solves basic less than 25% of the time - needs direction nearly all the time or does not effectively solve problems and may need a restraint for safety Memory Memory: 1-Recognizes or recalls less than 25% of the time/requires cueing greater than 75% of the time FIM - Eating Eating Activity: 4: Helper occasionally scoops food on utensil;4: Help with picking up utensils;5: Needs verbal cues/supervision  Pain Pain Assessment Pain Assessment: No/denies pain Pain Score: 0-No pain  Therapy/Group: Individual Therapy  Charlane Ferretti., CCC-SLP [phone]  Stormy Sabol 11/29/2011, 1:03 PM

## 2011-11-30 NOTE — Discharge Summary (Signed)
[name], Jose                ACCOUNT NO.:  [id number]  MEDICAL RECORD NO.:  [id number]  LOCATION:  4029                         FACILITY:  [facility]  PHYSICIAN:  Erick Colace, M.D.DATE OF BIRTH:  1928-10-30  DATE OF ADMISSION:  11/22/2011 DATE OF DISCHARGE:                              DISCHARGE SUMMARY   DISCHARGE DIAGNOSES: 1. 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 2. Sequential compression devices for deep vein thrombosis     prophylaxis. 3. Atrial fibrillation with pacemaker - chronic, Coumadin discontinued     secondary to hemorrhagic infarct. 4. Dementia. 5. Benign prostatic hypertrophy.  HISTORY OF PRESENT ILLNESS:  This is an 78 year old right-handed male with history of atrial fibrillation with pacemaker, on chronic Coumadin as well as documented dementia, TIA in January 2013, who was admitted on November 18, 2011 with left-sided weakness and numbness and left-sided visual difficulty as well as altered mental status.  The patient was independent prior to admission per wife and driving.  Cranial CT scan on November 18, 2011 showed no acute intracranial abnormalities.  INR on admission was 3.02.  Echocardiogram with ejection fraction of 45% without embolus.  Recent carotid Doppler showed no ICA stenosis. Followup cranial CT scan on November 20, 2011 showed a new 7 x 10 mm hemorrhagic focus right parietal region with mild surrounding edema, not present previously.  Suspect acute nonhemorrhagic right occipital infarct, not visualized previously and MRI not done due to pacemaker. Neurology had been consulted on November 20, 2011.  Coumadin therapy discontinued secondary to hemorrhagic transformation, placed on aspirin therapy once INR less than 2.0.  Hematology had been consulted due to persistent leukocytosis, felt related to urinary tract infection. Workup unremarkable, felt to be reactive in nature with  close monitoring.  He was maintained on a mechanical soft diet.  He was admitted for comprehensive rehab program.  PAST MEDICAL HISTORY:  See discharge diagnoses.  SOCIAL HISTORY:  Lives with spouse in a condominium, 2 steps to entry.  FUNCTIONAL HISTORY:  Prior to admission was independent, driving short distances.  FUNCTIONAL STATUS:  Upon admission to rehab services was minimal guard for scooting to the edge of the bed, moderate assist sit to stand, minimal assist to ambulate 170 feet with one-person handheld assistance.  PHYSICAL EXAMINATION:  VITAL SIGNS:  Blood pressure 108/70, pulse 65, respirations 17, temperature 97.2. GENERAL:  This was an alert male, in no acute distress. CARDIAC:  Rate controlled. ABDOMEN:  Soft, nontender.  Good bowel sounds. LUNGS:  Clear to auscultation. NEUROLOGIC:  The patient needed ongoing cues to name place, situation. He cannot recall his living situation.  He did follow simple commands. He exhibited limited insight and awareness with poor attention delayed processing.  REHABILITATION HOSPITAL COURSE:  The patient was admitted to inpatient rehab services with therapies initiated on a 3-hour daily basis consisting of physical therapy, occupational therapy, speech therapy, and rehabilitation nursing.  The following issues were addressed during the patient's rehabilitation stay.  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  Sequential compression devices were in place for deep vein thrombosis prophylaxis.  Cardiac rate remained well controlled with pacemaker history maintained on atenolol 50 mg daily.  He did have a documented history of dementia maintained on Aricept and Namenda.  He continued to receive therapies per Speech Therapy.  Ritalin was added to his regimen to  help focus on overall tasks with good results.  Noted persistent leukocytosis workup unremarkable.  Latest white blood cell count of 15,800.  Urinalysis study was negative.  Leukocytosis felt to be reactive in nature.  The patient received weekly collaborative interdisciplinary team conferences to discuss estimated length of stay, family teaching, and any barriers to his discharge.  He was continent of bowel and bladder, tolerating a regular consistency diet.  He was currently standby assist for grooming, upper body, lower body dressing, minimal assist for lower body, tying shoes, buttoning pants, shower transfers.  Standby assist with grabbing shower chair.  He was supervision minimal assist overall for his functional mobility.  He was able to communicate his needs.  DISCHARGE PLAN:  To be discharged to [facility] Unit.  Wife had stated that the couple had been on their list for years for planned admission to [facility].  Discharge took place on December 01, 2011.  DISCHARGE MEDICATIONS: 1. Aspirin 81 mg p.o. daily. 2. Tenormin 50 mg p.o. daily. 3. Aricept 10 mg p.o. at bedtime. 4. Namenda 10 mg p.o. b.i.d. 5. Ritalin 5 mg p.o. b.i.d. 6. Tylenol 650 mg p.o. every 4 hours as needed for pain or fever.  DIET:  Regular.  SPECIAL INSTRUCTIONS:  The patient would follow up with Dr. Claudette Laws at the [facility] as advised and Dr. Illene Regulus medical management.     Mariam Dollar, P.A.   ______________________________ Erick Colace, M.D.    DA/MEDQ  D:  11/30/2011  T:  11/30/2011  Job:  [id number]  cc:   Rosalyn Gess. Norins, MD

## 2011-11-30 NOTE — Progress Notes (Signed)
Patient ID: [name], male   DOB: 1929/03/16, 78 y.o.   MRN: [id number] Subjective/Complaints: 78 y.o. right-handed male with history of atrial fibrillation/pacemaker on chronic Coumadin therapy as well as documented dementia and TIA 06/2011 admitted 11/18/2011 with left-sided weakness and numbness and left-sided visual difficulty as well as altered mental status. Patient independent prior to admission per wife and driving. Cranial CT scan 11/18/2011 showed no acute intracranial abnormalities. His INR on admission was 3.02.  Echocardiogram with ejection fraction of 45% without embolus. Recent carotid Doppler study showed no ICA stenosis. A followup cranial CT scan 11/20/2011 showed new 7 x 10 mm hemorrhagic focus right parietal region with mild surrounding edema not present previously. Suspect acute nonhemorrhagic right occipital infarct not visualized previously. MRI was not done due to pacemaker. Neurology had been consulted 11/20/2011 Coumadin therapy discontinued secondary to hemorrhagic transformation and plan to begin aspirin therapy 81 mg daily once INR less than 2.00Hematology consulted due to persistent leukocytosis felt related to urinate tract infection with workup unremarkable felt to be reactive in nature with workup ongoing to rule out myeloproliferative disorder  No problems reported but pt with limited insight related to dementia and CVA  Review of Systems  Psychiatric/Behavioral: Positive for memory loss.  All other systems reviewed and are negative.     Objective: Vital Signs: Blood pressure 108/70, pulse 77, temperature 98.6 F (37 C), temperature source Oral, resp. rate 17, height 6\' 5"  (1.956 m), weight 82.9 kg (182 lb 12.2 oz), SpO2 96.00%. No results found. No results found for this or any previous visit (from the past 72 hour(s)).   HEENT: normal Cardio: RRR Resp: CTA B/L GI: BS positive Extremity:  No Edema Skin:   Intact Neuro: Confused and Abnormal Motor 4/5 in  BUE and BLE Musc/Skel:  Normal L neglect on confrontation testing Decreased sensation on L side  Assessment/Plan: 1. Functional deficits secondary to R parieto occipital hemorrhage due to coagulopathy  which require 3+ hours per day of interdisciplinary therapy in a comprehensive inpatient rehab setting. Physiatrist is providing close team supervision and 24 hour management of active medical problems listed below. Physiatrist and rehab team continue to assess barriers to discharge/monitor patient progress toward functional and medical goals. FIM: FIM - Bathing Bathing Steps Patient Completed: Chest;Right Arm;Left Arm;Abdomen;Front perineal area;Buttocks;Right upper leg;Left upper leg;Right lower leg (including foot);Left lower leg (including foot) Bathing: 5: Supervision: Safety issues/verbal cues  FIM - Upper Body Dressing/Undressing Upper body dressing/undressing steps patient completed: Thread/unthread right sleeve of pullover shirt/dresss;Thread/unthread left sleeve of pullover shirt/dress;Put head through opening of pull over shirt/dress;Pull shirt over trunk Upper body dressing/undressing: 5: Supervision: Safety issues/verbal cues FIM - Lower Body Dressing/Undressing Lower body dressing/undressing steps patient completed: Thread/unthread right underwear leg;Thread/unthread left underwear leg;Pull underwear up/down;Thread/unthread right pants leg;Thread/unthread left pants leg;Pull pants up/down;Fasten/unfasten pants;Don/Doff right shoe;Don/Doff left shoe Lower body dressing/undressing: 4: Min-Patient completed 75 plus % of tasks (assist with fastening pants)  FIM - Toileting Toileting steps completed by patient: Adjust clothing prior to toileting;Performs perineal hygiene;Adjust clothing after toileting Toileting Assistive Devices: Grab bar or rail for support Toileting: 4: Steadying assist  FIM - Diplomatic Services operational officer Devices: Grab bars Toilet Transfers: 5-To  toilet/BSC: Supervision (verbal cues/safety issues);5-From toilet/BSC: Supervision (verbal cues/safety issues)  FIM - Banker Devices: Bed rails Bed/Chair Transfer: 5: Bed > Chair or W/C: Supervision (verbal cues/safety issues);5: Chair or W/C > Bed: Supervision (verbal cues/safety issues)  FIM - Locomotion: Wheelchair Locomotion: Wheelchair: 0:  Activity did not occur FIM - Locomotion: Ambulation Ambulation/Gait Assistance: 5: Supervision Locomotion: Ambulation: 5: Travels 150 ft or more with supervision/safety issues  Comprehension Comprehension Mode: Auditory Comprehension: 2-Understands basic 25 - 49% of the time/requires cueing 51 - 75% of the time  Expression Expression Mode: Verbal Expression: 2-Expresses basic 25 - 49% of the time/requires cueing 50 - 75% of the time. Uses single words/gestures.  Social Interaction Social Interaction: 2-Interacts appropriately 25 - 49% of time - Needs frequent redirection.  Problem Solving Problem Solving: 1-Solves basic less than 25% of the time - needs direction nearly all the time or does not effectively solve problems and may need a restraint for safety  Memory Memory: 1-Recognizes or recalls less than 25% of the time/requires cueing greater than 75% of the time  Medical Problem List and Plan:  1. 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 DVT Prophylaxis/Anticoagulation: SCDs. Monitor for any signs of DVT  3. Leukocytosis: Latest WBC of 15,800. Followup per hematology services. Workup thus far unremarkable felt to be reactive in nature. 4. Atrial fibrillation/pacemaker. Coumadin discontinued secondary hemorrhagic infarct.INR less than 2.00 off warfarin on ASA. Cardiac rate is controlled  5. Dementia. Continue Aricept and Namenda. Speech therapy  to provide evaluation.Not oriented to place or time, had premorbid problems with time orientatation and attn to L but was reportedly still driving 6. History BPH. Check PVRs x3. Latest urine study showed no growth 7. Dispo: family concerned regarding 24/7 supervision,MSW to address possible SNF post D/C   LOS (Days) 8 A FACE TO FACE EVALUATION WAS PERFORMED  Telvin Reinders E 11/30/2011, 7:32 AM

## 2011-11-30 NOTE — Progress Notes (Signed)
Speech Language Pathology Daily Session Note  Patient Details  Name: [name] MRN: [id number] Date of Birth: 10/10/1928  Today's Date: 11/30/2011 Time: 0835-0920 Time Calculation (min): 45 min  Short Term Goals: Week 1:  See Long Term Goals  Skilled Therapeutic Interventions: Treatment session focused on initiating and problem solving with self feedig task. SLP facilitated session with total assist cues to locate, problem solve use and initiate each bite or sip today with verbal, visual, tactile and demonstrative cues. There were no exteral distractions present; however, patient appeared internally distracted during today's session often staring off into space after each bite and required total cues to bring attention back to task each time with cues to complete each step of self feeding.  SLP also facilitated session by educating wife on effective strategies to facilitate initiation and she returned demonstration with minimal assist semantic cues.   FIM:  Comprehension Comprehension Mode: Auditory Comprehension: 1-Understands basic less than 25% of the time/requires cueing 75% of the time Expression Expression Mode: Verbal Expression: 2-Expresses basic 25 - 49% of the time/requires cueing 50 - 75% of the time. Uses single words/gestures. Social Interaction Social Interaction: 2-Interacts appropriately 25 - 49% of time - Needs frequent redirection. Problem Solving Problem Solving: 1-Solves basic less than 25% of the time - needs direction nearly all the time or does not effectively solve problems and may need a restraint for safety Memory Memory: 1-Recognizes or recalls less than 25% of the time/requires cueing greater than 75% of the time FIM - Eating Eating Activity: 4: Helper occasionally scoops food on utensil;4: Helper occasionally brings food to mouth  Pain Pain Assessment Pain Assessment: No/denies pain Pain Score: 0-No pain Patients Stated Pain Goal: 3 Multiple Pain  Sites: No  Therapy/Group: Individual Therapy  Charlane Ferretti., CCC-SLP [phone]  Cainan Trull 11/30/2011, 11:28 AM

## 2011-11-30 NOTE — Patient Care Conference (Signed)
Inpatient RehabilitationTeam Conference Note Date: 11/29/2011   Time: 10:47 AM    Patient Name: [name]      Medical Record Number: [id number]  Date of Birth: 1929-01-12 Sex: Male         Room/Bed: 4029/4029-01 Payor Info: Payor: Advertising copywriter MEDICARE  Plan: [employer] MEDICARE COMPLETE  Product Type: *No Product type*     Admitting Diagnosis: R CVA  Admit Date/Time:  11/22/2011  1:37 PM Admission Comments: No comment available   Primary Diagnosis:  CVA (cerebrovascular accident due to intracerebral hemorrhage) Principal Problem: CVA (cerebrovascular accident due to intracerebral hemorrhage)  Patient Active Problem List   Diagnosis Date Noted  . CVA (cerebrovascular accident due to intracerebral hemorrhage) 11/22/2011  . TIA (transient ischemic attack) 11/18/2011  . Numbness and tingling in left hand 07/18/2011  . Hyperglycemia 07/09/2011  . ADENOCARCINOMA, PROSTATE, GLEASON GRADE 4 06/27/2010  . Dementia 02/24/2010  . PACEMAKER, PERMANENT 11/04/2008  . Atrial fibrillation 07/09/2008  . LEG PAIN, LEFT 01/15/2008  . OTHER ABNORMAL BLOOD CHEMISTRY 01/15/2008  . POLYP, COLON 10/21/2007  . HYPERLIPIDEMIA 10/21/2007  . HYPERTENSION 10/21/2007  . BRADYCARDIA 10/21/2007  . HEMORRHOIDS 10/21/2007  . DIVERTICULOSIS, COLON 10/21/2007  . BENIGN PROSTATIC HYPERTROPHY, HX OF 10/21/2007    Expected Discharge Date: Expected Discharge Date:  (await SNF bed)  Team Members Present: Physician: Dr. Claudette Laws Case Manager Present: Melanee Spry, RN Social Worker Present: Dossie Der, LCSW Nurse Present: Other (comment) Berta Minor) PT Present: Edman Circle, Judith Blonder, PTA;Edson Snowball, PT OT Present: Leonette Monarch, OT;Other (comment) Vernona Rieger Essenmacher) SLP Present: Other (comment) Elio Forget)     Current Status/Progress Goal Weekly Team Focus  Medical   left neglect, left hemisensory deficits, acute on chronic memory attention and concentration deficits  improved  awareness of left side, improve sit  continue with PT, OT, speech therapy, assess why it's ability to provide care   Bowel/Bladder   continent of bowel and bladder  remain continent of bowel and bladder      Swallow/Nutrition/ Hydration   regular textures and thin liquids         ADL's   Patient is currently SBA for grooming/UBB/LBB/UBD; Min A for LBD (tying shoes, buttoning pants, ted hose). Shower transfer: SBA with grab and shower chair. Patient has not been seen for toileting by OT.  LTG: SBA for bathing/dressing/grooming/transfers with min cueing. Patient will also increase attention to left side environment with cues less than 25% of the time.  Currenty working on sequencing, LBD(will educate patient and wife on elastic shoelaces), safety awareness, and increase left side attention.   Mobility   supervision-min A overall  supervision overall with increased attention to L  gait, balance, attention to L, initiation, safety during functional mobility   Communication   max assist  mod assist   educating family on cuing strategies   Safety/Cognition/ Behavioral Observations  max assit  mod assist with basic, familiar tasks  increase initiation    Pain   N/A  N/A      Skin   buttocks and scrotum red, applying cream.  no new skin breakdown  Patient able to turn and reposition self.  Encourage patient to turn and reposition in bed.      *See Interdisciplinary Assessment and Plan and progress notes for long and short-term goals  Barriers to Discharge: wife cannot provide 24 7 supervision    Possible Resolutions to Barriers:  may need skilled nursing placement    Discharge Planning/Teaching Needs:  Family speaking with [facility] regarding their options-ie SNF or Memory care      Team Discussion: Pleasant-requires constant cuing   Revisions to Treatment Plan: none    Continued Need for Acute Rehabilitation Level of Care: The patient requires daily medical management by a  physician with specialized training in physical medicine and rehabilitation for the following conditions: Daily direction of a multidisciplinary physical rehabilitation program to ensure safe treatment while eliciting the highest outcome that is of practical value to the patient.: Yes Daily medical management of patient stability for increased activity during participation in an intensive rehabilitation regime.: Yes Daily analysis of laboratory values and/or radiology reports with any subsequent need for medication adjustment of medical intervention for : Neurological problems  Brock Ra 11/30/2011, 12:17 PM

## 2011-11-30 NOTE — Progress Notes (Signed)
Physical Therapy Session Note  Patient Details  Name: [name] MRN: [id number] Date of Birth: 04-15-1929  Today's Date: 11/30/2011 Time: 1118-1203 Time Calculation (min): 45 min  Short Term Goals: Week 1:  PT Short Term Goal 1 (Week 1): = LTG secondary to LOS  Therapy Documentation Precautions:  Precautions Precautions: Fall Precaution Comments: h/o dementia, L inattention; D3 diet with thin liquids, intermittent supervision Restrictions Weight Bearing Restrictions: No Pain: Pain Assessment Pain Assessment: No/denies pain Pain Score: 0-No pain Balance:  Dynamic standing balance training with focus on lateral weight shifts, sustained attention to task, dual task and divided attention in moderately distracting environment during alternating LE ball stops and kicks for single limb stance, ball bouncing with lateral and retro stepping and ball tossing with trunk rotation to L and R during various cognitive activties and moderate-max verbal cues to attend to task.  Sitting rest breaks due to fatigue and patient requiring increased cues for basic cognition tasks.  See FIM for current functional status  Therapy/Group: Individual Therapy  Edman Circle [facility] 11/30/2011, 12:09 PM

## 2011-11-30 NOTE — Plan of Care (Signed)
Problem: RH BLADDER ELIMINATION Goal: RH STG MANAGE BLADDER WITH ASSISTANCE STG Manage Bladder With min Assistance  Outcome: Progressing Pt with timed toileting every  3 hrs and prn, depends for protection

## 2011-11-30 NOTE — Progress Notes (Signed)
Social Work Patient ID: [name], male   DOB: Jun 29, 1928, 78 y.o.   MRN: [id number] Spoke with [facility] who reports wife and son have signed the paperwork for their SNF. Checked with MD he reports pt is medically ready for transfer tomorrow.  Spoke with wife who is agreeable to plan. Will gather paperwork for transfer tomorrow via ambulance at 11:00.  Pt agreeable to transfer tomorrow.

## 2011-11-30 NOTE — Discharge Summary (Signed)
  Her summary job [phone]

## 2011-11-30 NOTE — Progress Notes (Signed)
Physical Therapy Discharge Summary  Patient Details  Name: [name] MRN: [id number] Date of Birth: 1928-10-18  Today's Date: 11/30/2011 Time: 0865-7846 Time Calculation (min): 45 min  Patient has met 8 of 9 long term goals due to improved balance, increased strength, improved attention and improved initiation and sequencing for basic daily tasks.  Patient to discharge at an ambulatory level Supervisionwithout an AD but with mod-max verbal, tactile and visual cues for initiation, safety, sequencing and attention to L environment. Patient's care partner feels she will be unable to provide 24/7 supervision and cueing needed to ensure patient's safety with mobility at discharge and has [facility] she will be moving into an independent living apartment.  Reasons goals not met: Patient did reach supervision standing balance goal but did not reach Berg balance score of 55/56 to decrease falls risk; patient still at moderate risk for falls as indicated by Berg balance score of 46/56.  Recommendation:  Patient will benefit from ongoing skilled PT services in skilled nursing facility setting to continue to advance safe functional mobility, address ongoing impairments in impaired initiation, attention, attention to L side, safety awareness, apraxia, impaired dynamic standing balance, gait, and minimize fall risk.  Equipment: No equipment provided  Reasons for discharge: discharge from hospital  Patient/family agrees with progress made and goals achieved: Yes  PT Discharge Pain Pain Assessment Pain Assessment: No/denies pain Sensation Sensation Light Touch: Appears Intact Stereognosis: Not tested Hot/Cold: Not tested Proprioception: Impaired Detail Proprioception Impaired Details: Impaired LUE;Impaired LLE Coordination Gross Motor Movements are Fluid and Coordinated: Yes Motor  Motor Motor: Motor apraxia  Mobility Bed Mobility Bed  Mobility: Sit to Supine Supine to Sit: 5: Supervision Sit to Supine: 5: Supervision Transfers Stand Pivot Transfers: 5: Supervision Stand Pivot Transfer Details (indicate cue type and reason): Cues for L hand placement and attention to L when pivoting; also reviewed floor > furniture transfer prone > quadruped > tall kneeling > half kneeling > stand and sit on mat with supervision but total verbal cues to sequence Patient also performed w/c > toilet and stood at toilet to perform urination with supervision only and only one questioning cue to initiate transferring to toilet.  No cues needed in bathroom for toileting sequence.  Stood at sink to wash hands with min verbal and visual cues to find soap and hot water on L; no cues needed for washing sequence.   Locomotion  Ambulation Ambulation/Gait Assistance: 5: Supervision Ambulation Distance (Feet): 200 Feet Ambulation/Gait Assistance Details: Supervision; still at decreased gait speed; intermittent verbal cues to attend to L environment and safe obstacle negotiation Gait Gait velocity: 2.98 ft/sec indicating safe community ambulator; patient is inconsistent with gait velocity and often requires verbal and visual cues to maintain continuous gait speed secondary to becoming distracted. Stairs / Management consultant Assistance: 5: Supervision Stairs Assistance Details (indicate cue type and reason): Supervision with verbal cue to attend to LUE position on rails and verbal cues for initiation. Stair Management Technique: Two rails;Alternating pattern;Forwards Number of Stairs: 12  Height of Stairs: 6  Wheelchair Mobility Wheelchair Mobility: No   Balance Standardized Balance Assessment Standardized Balance Assessment: Hospital doctor Berg Balance Test Sit to Stand: Able to stand without using hands and stabilize independently Standing Unsupported: Able to stand safely 2 minutes Sitting with Back Unsupported but Feet Supported on  Floor or Stool: Able to sit safely and securely 2 minutes Stand to Sit: Sits safely with minimal  use of hands Transfers: Able to transfer safely, minor use of hands Standing Unsupported with Eyes Closed: Able to stand 10 seconds safely Standing Ubsupported with Feet Together: Able to place feet together independently and stand for 1 minute with supervision From Standing, Reach Forward with Outstretched Arm: Can reach confidently >25 cm (10") From Standing Position, Pick up Object from Floor: Able to pick up shoe safely and easily From Standing Position, Turn to Look Behind Over each Shoulder: Looks behind from both sides and weight shifts well Turn 360 Degrees: Able to turn 360 degrees safely but slowly Standing Unsupported, Alternately Place Feet on Step/Stool: Able to complete 4 steps without aid or supervision Standing Unsupported, One Foot in Front: Able to take small step independently and hold 30 seconds Standing on One Leg: Tries to lift leg/unable to hold 3 seconds but remains standing independently Total Score: 46  Extremity Assessment  RLE Assessment RLE Assessment: Within Functional Limits LLE Assessment LLE Assessment: Within Functional Limits  See FIM for current functional status  Edman Circle [facility] 11/30/2011, 5:05 PM

## 2011-11-30 NOTE — Progress Notes (Signed)
Occupational Therapy Session Note  Patient Details  Name: [name] MRN: [id number] Date of Birth: [birth year]  Today's Date: 11/30/2011 Time: 0730-0830 Time Calculation (min): 60 min  Short Term Goals: Week 2:  OT Short Term Goal 1 (Week 2): Pt will complete grooming with supervision  OT Short Term Goal 2 (Week 2): Pt will complete toilet transfer with supervision  OT Short Term Goal 3 (Week 2): Patient will complete LB dressing with supervision.  Skilled Therapeutic Interventions/Progress Updates:  Patient sleeping upon therapy arrival. Patient agreeable to take shower this AM. Patient performed toileting while standing at toilet before entering walk-in shower. Patient performed UB and LB bathing with cues for sequencing. Patient dressed while sitting on EOB. Patient required max cues for sequencing during dressing. Patient was handed underwear and asked to put them on. Instead, patient used them as a towel to dry himself off. Patient had difficulty with managing his left LE during LB dressing. Patient placed both legs in right pant leg several times even with task set-up. Patient required only assistance with fastening pants during LB dressing. Patient stood at sink to brush teeth. During teeth brushing patient worked on scanning to his left to locate items for task. Patient required max cues to complete and to attend to left side of sink. Patient continues to present with apraxia as during ADL patient requires several prompts and cues before executing task. Patient also presents with expressive aphasia during ADL. Patient picked up his toothbrush and thought it was his comb. Wife present during ADL.  Therapy Documentation Precautions:  Precautions Precautions: Fall Precaution Comments: h/o dementia, L inattention; D3 diet with thin liquids, intermittent supervision Restrictions Weight Bearing Restrictions: No Pain: Pain Assessment Pain Assessment: No/denies pain Pain Score: 0-No  pain  See FIM for current functional status  Therapy/Group: Individual Therapy  Jasraj Lappe, Charisse March 11/30/2011, 8:38 AM

## 2011-11-30 NOTE — Plan of Care (Signed)
Problem: RH BOWEL ELIMINATION Goal: RH STG MANAGE BOWEL W/MEDICATION W/ASSISTANCE STG Manage Bowel with Medication with min Assistance.  Outcome: Progressing Pt not on scheduled medication for bowels, has prn medication LBM per report 11/29/11

## 2011-12-01 NOTE — Progress Notes (Signed)
Occupational Therapy Discharge Summary  Patient Details  Name: [name] MRN: [id number] Date of Birth: 04-May-1929  Today's Date: 12/01/2011 Time:  -     Patient has met 6 of 8 long term goals due to improved balance and requiring increased assistance with fastening pants and max cueing required to attend to left side..  Patient to discharge at overall Supervision for functional transfers/bathing/UB dressing/toileting; Min A for LB dressing. level.  Patient's care partner unavailable to provide the necessary 24 hour assistance at discharge.    Reasons goals not met: Pt requires assistance with fastening pants for LB dressing. Patient requires max cues to attend to left side.  Recommendation:  Patient will benefit from ongoing skilled OT services in skilled nursing facility setting to continue to advance functional skills in the area of BADL and left awareness.  Equipment: No equipment provided  Reasons for discharge: Patient met maximum potential for rehab.  Patient/family agrees with progress made and goals achieved: Yes  OT Discharge   ADL ADL Equipment Provided: Other (comment) (elastic shoelaces) Grooming: Supervision/safety;Maximal cueing Where Assessed-Grooming: Standing at sink Upper Body Bathing: Supervision/safety;Moderate cueing Where Assessed-Upper Body Bathing: Shower Lower Body Bathing: Supervision/safety;Moderate cueing Where Assessed-Lower Body Bathing: Shower Upper Body Dressing: Supervision/safety;Moderate cueing Where Assessed-Upper Body Dressing: Edge of bed Lower Body Dressing: Minimal assistance;Maximal cueing Where Assessed-Lower Body Dressing: Edge of bed Toileting: Supervision/safety;Moderate cueing Where Assessed-Toileting: Teacher, adult education: Close supervision;Maximal verbal cueing Toilet Transfer Method: Event organiser: Close supervision;Maximal cueing Film/video editor Method: Designer, industrial/product:  Visual merchandiser  Vision/Perception  Vision - History Baseline Vision: Wears glasses only for reading Patient Visual Report: Peripheral vision impairment  Cognition Overall Cognitive Status: Impaired Arousal/Alertness: Awake/alert Orientation Level: Oriented to person Attention: Sustained Focused Attention: Impaired Focused Attention Impairment: Verbal basic Sustained Attention: Impaired Sustained Attention Impairment: Verbal basic;Functional basic Memory: Impaired Memory Impairment: Storage deficit Decreased Long Term Memory: Verbal basic;Functional basic Awareness: Impaired Awareness Impairment: Intellectual impairment Problem Solving: Impaired Problem Solving Impairment: Verbal basic;Functional basic Executive Function: Sequencing;Organizing;Decision Making;Initiating;Self Monitoring;Self Correcting;Reasoning Reasoning: Impaired Reasoning Impairment: Verbal basic;Functional basic Sequencing: Impaired Sequencing Impairment: Verbal basic;Functional basic Organizing: Impaired Organizing Impairment: Verbal basic;Functional basic Decision Making: Impaired Decision Making Impairment: Verbal basic;Functional basic Initiating: Impaired Initiating Impairment: Verbal basic;Functional basic Self Monitoring: Impaired Self Monitoring Impairment: Verbal basic;Functional basic Self Correcting: Impaired Self Correcting Impairment: Verbal basic;Functional basic Safety/Judgment: Impaired Sensation Sensation Light Touch: Appears Intact Stereognosis: Not tested Hot/Cold: Appears Intact Proprioception: Impaired Detail Proprioception Impaired Details: Impaired LUE Coordination Gross Motor Movements are Fluid and Coordinated: Yes Fine Motor Movements are Fluid and Coordinated: No Motor  Motor Motor: Motor apraxia Extremity/Trunk Assessment RUE Assessment RUE Assessment: Within Functional Limits LUE Assessment LUE Assessment: Within Functional Limits  See FIM for  current functional status  Nioka Thorington, Charisse March 12/01/2011, 7:56 AM

## 2011-12-01 NOTE — Progress Notes (Signed)
Patient discharged at 59 with wife and son to [facility]. Report called to charge RN on [employer] at the facility. Hedy Camara

## 2011-12-01 NOTE — Progress Notes (Signed)
Patient ID: [name], male   DOB: 1928/12/14, 78 y.o.   MRN: [id number] Subjective/Complaints: 78 y.o. right-handed male with history of atrial fibrillation/pacemaker on chronic Coumadin therapy as well as documented dementia and TIA 06/2011 admitted 11/18/2011 with left-sided weakness and numbness and left-sided visual difficulty as well as altered mental status. Patient independent prior to admission per wife and driving. Cranial CT scan 11/18/2011 showed no acute intracranial abnormalities. His INR on admission was 3.02.  Echocardiogram with ejection fraction of 45% without embolus. Recent carotid Doppler study showed no ICA stenosis. A followup cranial CT scan 11/20/2011 showed new 7 x 10 mm hemorrhagic focus right parietal region with mild surrounding edema not present previously. Suspect acute nonhemorrhagic right occipital infarct not visualized previously. MRI was not done due to pacemaker. Neurology had been consulted 11/20/2011 Coumadin therapy discontinued secondary to hemorrhagic conversion now on   aspirin therapy 81 mg daily.  Hematology consulted due to persistent leukocytosis felt related to urinate tract infection with workup unremarkable felt to be reactive in nature with workup ongoing to rule out myeloproliferative disorder  No problems reported but pt with limited insight related to dementia and CVA  Review of Systems  Psychiatric/Behavioral: Positive for memory loss.  All other systems reviewed and are negative.     Objective: Vital Signs: Blood pressure 120/80, pulse 71, temperature 98.2 F (36.8 C), temperature source Oral, resp. rate 17, height 6\' 5"  (1.956 m), weight 81.9 kg (180 lb 8.9 oz), SpO2 93.00%. No results found. No results found for this or any previous visit (from the past 72 hour(s)).   HEENT: normal Cardio: RRR Resp: CTA B/L GI: BS positive Extremity:  No Edema Skin:   Intact Neuro: Confused and Abnormal Motor 4/5 in BUE and BLE Musc/Skel:   Normal L neglect on confrontation testing Decreased sensation on L side  Assessment/Plan: 1. Functional deficits secondary to R parieto occipital hemorrhage due to coagulopathy stable for D/C to [facility]: FIM - Bathing Bathing Steps Patient Completed: Chest;Right Arm;Left Arm;Abdomen;Front perineal area;Buttocks;Right upper leg;Left upper leg;Right lower leg (including foot);Left lower leg (including foot) Bathing: 5: Supervision: Safety issues/verbal cues  FIM - Upper Body Dressing/Undressing Upper body dressing/undressing steps patient completed: Thread/unthread right sleeve of pullover shirt/dresss;Thread/unthread left sleeve of pullover shirt/dress;Put head through opening of pull over shirt/dress;Pull shirt over trunk Upper body dressing/undressing: 5: Supervision: Safety issues/verbal cues FIM - Lower Body Dressing/Undressing Lower body dressing/undressing steps patient completed: Thread/unthread right underwear leg;Thread/unthread left underwear leg;Pull underwear up/down;Thread/unthread right pants leg;Thread/unthread left pants leg;Pull pants up/down;Fasten/unfasten pants;Don/Doff right shoe;Don/Doff left shoe Lower body dressing/undressing: 4: Min-Patient completed 75 plus % of tasks  FIM - Toileting Toileting steps completed by patient: Adjust clothing prior to toileting;Performs perineal hygiene;Adjust clothing after toileting Toileting Assistive Devices: Grab bar or rail for support Toileting: 5: Supervision: Safety issues/verbal cues  FIM - Diplomatic Services operational officer Devices: Grab bars Toilet Transfers: 5-To toilet/BSC: Supervision (verbal cues/safety issues);5-From toilet/BSC: Supervision (verbal cues/safety issues)  FIM - Banker Devices: Bed rails Bed/Chair Transfer: 5: Supine > Sit: Supervision (verbal cues/safety issues);5: Sit > Supine: Supervision (verbal cues/safety issues)  FIM - Locomotion:  Wheelchair Locomotion: Wheelchair: 0: Activity did not occur FIM - Locomotion: Ambulation Ambulation/Gait Assistance: 5: Supervision Locomotion: Ambulation: 5: Travels 150 ft or more with supervision/safety issues  Comprehension Comprehension Mode: Auditory Comprehension: 2-Understands basic 25 - 49% of the time/requires cueing 51 - 75% of the time  Expression Expression Mode: Verbal Expression: 2-Expresses basic 25 -  49% of the time/requires cueing 50 - 75% of the time. Uses single words/gestures.  Social Interaction Social Interaction: 2-Interacts appropriately 25 - 49% of time - Needs frequent redirection.  Problem Solving Problem Solving: 1-Solves basic less than 25% of the time - needs direction nearly all the time or does not effectively solve problems and may need a restraint for safety  Memory Memory: 1-Recognizes or recalls less than 25% of the time/requires cueing greater than 75% of the time  Medical Problem List and Plan:  1. 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 DVT Prophylaxis/Anticoagulation: SCDs. Monitor for any signs of DVT  3. Leukocytosis: Latest WBC of 15,800. Followup per hematology services. Workup thus far unremarkable felt to be reactive in nature. 4. Atrial fibrillation/pacemaker. Coumadin discontinued secondary hemorrhagic infarct.INR less than 2.00 off warfarin on ASA. Cardiac rate is controlled  5. Dementia. Continue Aricept and Namenda. Speech therapy to provide evaluation.Not oriented to place or time, had premorbid problems with time orientatation and attn to L but was reportedly still driving 6. History BPH. Check PVRs x3. Latest urine study showed no growth 7. Dispo: family concerned regarding 24/7 supervision,MSW to address possible SNF post D/C   LOS (Days) 9 A FACE TO FACE EVALUATION WAS  PERFORMED  Rosemae Mcquown E 12/01/2011, 7:20 AM

## 2011-12-01 NOTE — Progress Notes (Signed)
Speech Language Pathology Daily Session Note & Discharge Summary  Patient Details  Name: [name] MRN: [id number] Date of Birth: [birth year]  Today's Date: 12/01/2011 Time: 0832-0910 Time Calculation (min): 38 min  Short Term Goals: Week 1:  See Long Term Goals  Skilled Therapeutic Interventions: Treatment session focused on initiating and problem solving with slef feeding task.  SLP facilcaited session with max faded to mod assist cues SLP provided a verbal and tactile cues to initiaite and then would attend to self feedign for 3-4 bites and even alternate sips occassionally without assist.  Patient with improved verbal expression today requesting help "What do I do next?" "What do you want me to do with this?"   Overall, improved attention to task and verbal expression today.    FIM:  Comprehension Comprehension Mode: Auditory Comprehension: 2-Understands basic 25 - 49% of the time/requires cueing 51 - 75% of the time Expression Expression Mode: Verbal Expression: 3-Expresses basic 50 - 74% of the time/requires cueing 25 - 50% of the time. Needs to repeat parts of sentences. Social Interaction Social Interaction: 3-Interacts appropriately 50 - 74% of the time - May be physically or verbally inappropriate. Problem Solving Problem Solving: 1-Solves basic less than 25% of the time - needs direction nearly all the time or does not effectively solve problems and may need a restraint for safety Memory Memory: 1-Recognizes or recalls less than 25% of the time/requires cueing greater than 75% of the time FIM - Eating Eating Activity: 4: Helper occasionally scoops food on utensil;4: Help with picking up utensils  Pain Pain Assessment Pain Assessment: No/denies pain Pain Score: 0-No pain  Therapy/Group: Individual Therapy   Speech Language Pathology Discharge Summary  Patient Details  Name: [name] MRN: [id number] Date of Birth: 1929/03/30  Today's Date:  12/01/2011  Patient has met 1 of 3 long term goals.  Patient to discharge at overall Max level.  Reasons goals not met: Patient's attention and initiation impact his ability to solve basic problems and request help as needed patient has been progressing in these areas; however has not met his goals   Clinical Impression/Discharge Summary: Patient continues to demonstrate severe cognitive deficits in the area of attention, which impact all level of the cognitive hiearchy and impact his abilty to safely complete BADLs.  As a result, he would continue to benefit from skilled SLP services at the next level of care.  Care Partner:  Caregiver Able to Provide Assistance: No  Type of Caregiver Assistance: Cognitive  Recommendation:  Skilled Nursing facility  Rationale for SLP Follow Up: Maximize cognitive function and independence;Reduce caregiver burden   Equipment: none   Reasons for discharge: Discharged from hospital   Patient/Family Agrees with Progress Made and Goals Achieved: Yes   See FIM for current functional status  Charlane Ferretti., CCC-SLP [phone]  Joann Kulpa 12/01/2011, 9:22 AM

## 2011-12-01 NOTE — Progress Notes (Signed)
Social Work Discharge Note Discharge Note  The overall goal for the admission was met for:   Discharge location: Yes-[facility]  Length of Stay: Yes-9 DAYS  Discharge activity level: Yes-SUPERVISION/MIN LEVEL  Home/community participation: Yes  Services provided included: MD, RD, PT, OT, SLP, RN, CM, Pharmacy and SW  Financial Services: Private Insurance: [facility]  Follow-up services arranged: Other: SHORT TERM NHP  Comments (or additional information):GOING TO SNF THEN TRANSITION DOWN TOWARD ALF/MEMORY CARE  Patient/Family verbalized understanding of follow-up arrangements: Yes  Individual responsible for coordination of the follow-up plan: JEAN-WIFE  Confirmed correct DME delivered: Lucy Chris 12/01/2011    Kamilya Wakeman, Lemar Livings

## 2011-12-07 ENCOUNTER — Encounter (HOSPITAL_COMMUNITY): Payer: Self-pay

## 2011-12-07 ENCOUNTER — Emergency Department (HOSPITAL_COMMUNITY): Payer: Medicare Other

## 2011-12-07 ENCOUNTER — Inpatient Hospital Stay (HOSPITAL_COMMUNITY)
Admission: EM | Admit: 2011-12-07 | Discharge: 2011-12-18 | DRG: 064 | Disposition: E | Payer: Medicare Other | Attending: Internal Medicine | Admitting: Internal Medicine

## 2011-12-07 DIAGNOSIS — R197 Diarrhea, unspecified: Secondary | ICD-10-CM

## 2011-12-07 DIAGNOSIS — Z950 Presence of cardiac pacemaker: Secondary | ICD-10-CM

## 2011-12-07 DIAGNOSIS — D7289 Other specified disorders of white blood cells: Secondary | ICD-10-CM

## 2011-12-07 DIAGNOSIS — R509 Fever, unspecified: Secondary | ICD-10-CM | POA: Diagnosis not present

## 2011-12-07 DIAGNOSIS — D72829 Elevated white blood cell count, unspecified: Secondary | ICD-10-CM | POA: Diagnosis present

## 2011-12-07 DIAGNOSIS — N400 Enlarged prostate without lower urinary tract symptoms: Secondary | ICD-10-CM | POA: Diagnosis present

## 2011-12-07 DIAGNOSIS — I10 Essential (primary) hypertension: Secondary | ICD-10-CM | POA: Diagnosis present

## 2011-12-07 DIAGNOSIS — I6340 Cerebral infarction due to embolism of unspecified cerebral artery: Principal | ICD-10-CM | POA: Diagnosis present

## 2011-12-07 DIAGNOSIS — Z7982 Long term (current) use of aspirin: Secondary | ICD-10-CM

## 2011-12-07 DIAGNOSIS — E785 Hyperlipidemia, unspecified: Secondary | ICD-10-CM | POA: Diagnosis present

## 2011-12-07 DIAGNOSIS — F0390 Unspecified dementia without behavioral disturbance: Secondary | ICD-10-CM

## 2011-12-07 DIAGNOSIS — I509 Heart failure, unspecified: Secondary | ICD-10-CM

## 2011-12-07 DIAGNOSIS — Z79899 Other long term (current) drug therapy: Secondary | ICD-10-CM

## 2011-12-07 DIAGNOSIS — R4182 Altered mental status, unspecified: Secondary | ICD-10-CM

## 2011-12-07 DIAGNOSIS — Z8673 Personal history of transient ischemic attack (TIA), and cerebral infarction without residual deficits: Secondary | ICD-10-CM

## 2011-12-07 DIAGNOSIS — I4891 Unspecified atrial fibrillation: Secondary | ICD-10-CM

## 2011-12-07 DIAGNOSIS — G9340 Encephalopathy, unspecified: Secondary | ICD-10-CM | POA: Diagnosis present

## 2011-12-07 DIAGNOSIS — I214 Non-ST elevation (NSTEMI) myocardial infarction: Secondary | ICD-10-CM | POA: Diagnosis not present

## 2011-12-07 DIAGNOSIS — Z87891 Personal history of nicotine dependence: Secondary | ICD-10-CM

## 2011-12-07 DIAGNOSIS — Z8546 Personal history of malignant neoplasm of prostate: Secondary | ICD-10-CM

## 2011-12-07 DIAGNOSIS — R4020 Unspecified coma: Secondary | ICD-10-CM | POA: Diagnosis not present

## 2011-12-07 DIAGNOSIS — Z515 Encounter for palliative care: Secondary | ICD-10-CM

## 2011-12-07 DIAGNOSIS — D649 Anemia, unspecified: Secondary | ICD-10-CM | POA: Diagnosis present

## 2011-12-07 LAB — BASIC METABOLIC PANEL
CO2: 25 mEq/L (ref 19–32)
Calcium: 9.2 mg/dL (ref 8.4–10.5)
GFR calc non Af Amer: 52 mL/min — ABNORMAL LOW (ref >90)
Sodium: 139 mEq/L (ref 135–145)

## 2011-12-07 LAB — GLUCOSE, CAPILLARY: Glucose-Capillary: 118 mg/dL — ABNORMAL HIGH (ref 70–99)

## 2011-12-07 LAB — CBC
MCV: 95.7 fL (ref 78.0–100.0)
Platelets: 786 10*3/uL — ABNORMAL HIGH (ref 150–400)
RDW: 24.5 % — ABNORMAL HIGH (ref 11.5–15.5)
WBC: 29.5 10*3/uL — ABNORMAL HIGH (ref 4.0–10.5)

## 2011-12-07 LAB — URINALYSIS, ROUTINE W REFLEX MICROSCOPIC
Glucose, UA: NEGATIVE mg/dL
Leukocytes, UA: NEGATIVE
Nitrite: NEGATIVE
Protein, ur: NEGATIVE mg/dL
Urobilinogen, UA: 1 mg/dL (ref 0.0–1.0)

## 2011-12-07 LAB — POCT I-STAT TROPONIN I

## 2011-12-07 LAB — DIFFERENTIAL
Basophils Absolute: 0.1 10*3/uL (ref 0.0–0.1)
Eosinophils Relative: 1 % (ref 0–5)
Lymphocytes Relative: 1 % — ABNORMAL LOW (ref 12–46)

## 2011-12-07 LAB — PROTIME-INR: INR: 1.2 (ref 0.00–1.49)

## 2011-12-07 NOTE — ED Notes (Signed)
CBG of 118

## 2011-12-07 NOTE — ED Provider Notes (Addendum)
Seen by me immediately upon arrival .Patient became suddenly less responsive approximately 4 PM today while with his family. On exam patient is alert moves all extremities follow simple commands a phasic. Status post graduate stroke May 2013. On exam alert follows simple commands moves all extremities a phasic  Doug Sou, MD 12/08/2011 2146 Patient required soft restraints as he became mildly combative and was trying to climb out of bed.  Doug Sou, MD 12/08/11 [phone]

## 2011-12-07 NOTE — ED Notes (Signed)
Per EMS. Having dinner with family at approx 645p.Blank stare, then he got stiff, his head went down. Then he got weak.  And Weakness on left side from previous stroke June 1st. CBG 134.

## 2011-12-07 NOTE — ED Notes (Signed)
Pt. Placed on NRB. SpO2 100%.

## 2011-12-07 NOTE — Code Documentation (Addendum)
Patient at dinner with family when at 1845 developed increased left side weakness and decreased level of consciousness. Code stroke called at [place], patient arrived via EMS at [place], [place] exam at 3 and code stroke cancelled by EDP at 1947, Stroke team arrived at 38, [place] by family at 45. Residual weakness on left from previous stroke in which patient was discharged on 6/14. tPA was not delivered during previous admission due to INR levels, patient had hemorraghic transformation of infarct during previous stay. NIH 11

## 2011-12-07 NOTE — ED Notes (Signed)
Troponin results given to Dr. Ethelda Chick by B. Bing Plume, EMT

## 2011-12-07 NOTE — ED Notes (Signed)
Called to Cancel Code Stroke 

## 2011-12-07 NOTE — ED Provider Notes (Signed)
History     CSN: [id number]  Arrival date & time 12/08/2011  1940   First MD Initiated Contact with Patient 12/09/2011 2031      Chief Complaint  Patient presents with  . Weakness    Patient is a 78 y.o. male presenting with altered mental status. The history is provided by a relative.  Altered Mental Status This is a new problem. The current episode started today (6:45pm at dinner). The problem occurs constantly. The problem has been gradually worsening. He has tried nothing for the symptoms.    Past Medical History  Diagnosis Date  . Malignant neoplasm of prostate   . Unspecified psychosis   . Cardiac pacemaker [employer]   . Atrial fibrillation   . Pain in limb   . Encounter for long-term (current) use of other medications   . Unspecified essential hypertension   . BPH (benign prostatic hypertrophy)   . Other and unspecified hyperlipidemia   . Other acquired absence of organ   . Unspecified hemorrhoids without mention of complication   . Diverticulosis of colon (without mention of hemorrhage)   . History of vein stripping   . Thrombocytosis 07/09/2011  . Normocytic anemia 07/09/2011  . TIA (transient ischemic attack) 07/09/2011  . Benign neoplasm of colon   . Pacemaker     Past Surgical History  Procedure Date  . Pacemaker placement 1999, replaced 2008    hx tachy-brady  . Vein stripping laser   . Tonsillectomy and adenoidectomy     Family History  Problem Relation Age of Onset  . Diabetes Neg Hx   . Hypertension Neg Hx   . Coronary artery disease Mother   . Coronary artery disease Sister     History  Substance Use Topics  . Smoking status: Former Smoker -- 0.5 packs/day for 8 years    Types: Cigarettes  . Smokeless tobacco: Not on file  . Alcohol Use: 1.5 oz/week    3 drink(s) per week     beer only 3 cans a week      Review of Systems  Unable to perform ROS: Mental status change  Psychiatric/Behavioral: Positive for altered mental status.     Allergies  Review of patient's allergies indicates no known allergies.  Home Medications   Current Outpatient Rx  Name Route Sig Dispense Refill  . ACETAMINOPHEN ER 650 MG PO TBCR Oral Take 650 mg by mouth every 8 (eight) hours as needed. For pain or fever    . ASPIRIN 81 MG PO CHEW Oral Chew 81 mg by mouth daily.    . ATENOLOL 50 MG PO TABS Oral Take 50 mg by mouth daily.    . DONEPEZIL HCL 10 MG PO TABS Oral Take 10 mg by mouth at bedtime.    Marland Kitchen MEMANTINE HCL 10 MG PO TABS Oral Take 10 mg by mouth 2 (two) times daily.    . METHYLPHENIDATE HCL 5 MG PO TABS Oral Take 5 mg by mouth 2 (two) times daily.      BP 144/95  Pulse 80  Temp 97.5 F (36.4 C) (Axillary)  SpO2 95%  Physical Exam  Nursing note and vitals reviewed. Constitutional: He appears well-developed and well-nourished.  HENT:  Head: Normocephalic and atraumatic.  Right Ear: External ear normal.  Left Ear: External ear normal.  Nose: Nose normal.  Mouth/Throat: Oropharynx is clear and moist. No oropharyngeal exudate.  Eyes: Conjunctivae are normal.       Pupils constricted and pinpoint  Neck: Normal range of motion.  Cardiovascular: Normal rate, regular rhythm, normal heart sounds and intact distal pulses.   Pulmonary/Chest: Effort normal and breath sounds normal. No respiratory distress. He has no wheezes. He has no rales. He exhibits no tenderness.  Abdominal: Soft. Bowel sounds are normal. He exhibits no distension and no mass. There is no tenderness. There is no rebound and no guarding.  Musculoskeletal: Normal range of motion. He exhibits no edema and no tenderness.  Neurological: He exhibits abnormal muscle tone (increased tone in bilateral upper and lower extremities). GCS eye subscore is 4. GCS verbal subscore is 2. GCS motor subscore is 4.       GCS 10, obtunded     Skin: Skin is warm and dry. No rash noted. No erythema. No pallor.  Psychiatric: He has a normal mood and affect. His behavior is  normal. Judgment and thought content normal.    ED Course  Procedures (including critical care time)  Labs Reviewed  CBC - Abnormal; Notable for the following:    WBC 29.5 (*)     RBC 3.73 (*)     Hemoglobin 11.9 (*)     HCT 35.7 (*)     RDW 24.5 (*)     Platelets 786 (*)     All other components within normal limits  DIFFERENTIAL - Abnormal; Notable for the following:    Neutrophils Relative 87 (*)     Neutro Abs 26.7 (*)     Lymphocytes Relative 1 (*)     Lymphs Abs 0.4 (*)     Monocytes Absolute 3.2 (*)     All other components within normal limits  BASIC METABOLIC PANEL - Abnormal; Notable for the following:    Glucose, Bld 130 (*)     GFR calc non Af Amer 52 (*)     GFR calc Af Amer 61 (*)     All other components within normal limits  URINALYSIS, ROUTINE W REFLEX MICROSCOPIC - Abnormal; Notable for the following:    Color, Urine AMBER (*)  BIOCHEMICALS MAY BE AFFECTED BY COLOR   APPearance CLOUDY (*)     Bilirubin Urine SMALL (*)     All other components within normal limits  PROTIME-INR - Abnormal; Notable for the following:    Prothrombin Time 15.5 (*)     All other components within normal limits  GLUCOSE, CAPILLARY - Abnormal; Notable for the following:    Glucose-Capillary 118 (*)     All other components within normal limits  POCT I-STAT TROPONIN I - Abnormal; Notable for the following:    Troponin i, poc 0.15 (*)     All other components within normal limits  PRO B NATRIURETIC PEPTIDE - Abnormal; Notable for the following:    Pro B Natriuretic peptide (BNP) 2101.0 (*)     All other components within normal limits  URINE CULTURE  AMMONIA   Ct Head Wo Contrast  12-20-11  *RADIOLOGY REPORT*  Clinical Data: Change in mental status.  CT HEAD WITHOUT CONTRAST  Technique:  Contiguous axial images were obtained from the base of the skull through the vertex without contrast.  Comparison: Multiple recent CTs the head, most recently 11/22/2011. The para delete   Findings: That the previously seen right parietal hemorrhage has cleared.  Atrophy and diffuse white matter hypoattenuation is stable.  No acute cortical infarct, hemorrhage, or mass lesion is present.  The ventricles are proportionate to the degree of atrophy.  No significant extra-axial fluid collection  is present. There is some fluid in the left sphenoid sinus.  The paranasal sinuses and mastoid air cells are clear.  The osseous skull is intact.  IMPRESSION:  1.  Stable atrophy and white matter disease. 2.  Interval resolution of right parietal hemorrhage. 3.  No acute intracranial abnormality. 4.  Minimal sinus disease.  Original Report Authenticated By: Jamesetta Orleans. MATTERN, M.D.   Dg Chest Portable 1 View  11/18/2011  *RADIOLOGY REPORT*  Clinical Data: Weakness.  Altered mental status.  PORTABLE CHEST - 1 VIEW  Comparison: Portable chest 11/18/2011.  Findings: The heart is mildly enlarged.  A single pacing lead is in place.  New pulmonary vascular congestion is present.  There is interval increase and a mild interstitial pattern, suggesting mild edema.  No focal airspace disease is evident.  IMPRESSION:  1.  Cardiomegaly with slight increase and a diffuse interstitial pattern suggesting edema and congestive heart failure. 2.  Single lead cardiac pacer.  Original Report Authenticated By: Jamesetta Orleans. MATTERN, M.D.     1. Altered mental status   2. Acute exacerbation of congestive heart failure     Date: 12/08/2011  Rate: 98 bpm  Rhythm: atrial fibrillation  QRS Axis: normal  Intervals: No P waves  ST/T Wave abnormalities: ST depressions anteriorly  Conduction Disutrbances:nonspecific intraventricular conduction delay  Narrative Interpretation: Enhancement of chronic ST depressions in anterior leads   Old EKG Reviewed: changes noted   MDM  78 yo M presents after a sudden onset of decreased responsiveness, aphasia, and worsened contractures at 6:45 pm. Pt had hemorraghic CVA 3 weeks ago  (had been on Coumadin at that time, which was discontinued and 81mg  daily ASA was initiated). Pt obtunded but protecting airway initially, with gradual improvement in responsiveness during ED course. Head CT negative for evidence or re-bleed or new hemorrhage and no evidence of infarction. Labs and U/A not concerning for electrolyte abnormalities, hypercapnia, acute renal failure, or UTI. CXR not concerning for PNA but concerning for CHF exacerbation. EKG unchanged. Troponin slightly positive, but in setting of recent intracranial bleed, heparin and aspirin are held. Furthermore, mild troponin elevation likely secondary to CHF exacerbation. Hospitalist service consulted and will admit.        Clemetine Marker, MD 12/08/11 [phone]

## 2011-12-08 ENCOUNTER — Inpatient Hospital Stay (HOSPITAL_COMMUNITY): Payer: Medicare Other

## 2011-12-08 ENCOUNTER — Encounter (HOSPITAL_COMMUNITY): Payer: Self-pay | Admitting: *Deleted

## 2011-12-08 DIAGNOSIS — R4182 Altered mental status, unspecified: Secondary | ICD-10-CM

## 2011-12-08 DIAGNOSIS — R55 Syncope and collapse: Secondary | ICD-10-CM

## 2011-12-08 DIAGNOSIS — R7989 Other specified abnormal findings of blood chemistry: Secondary | ICD-10-CM

## 2011-12-08 LAB — URINALYSIS, ROUTINE W REFLEX MICROSCOPIC
Glucose, UA: NEGATIVE mg/dL
Ketones, ur: NEGATIVE mg/dL
Nitrite: NEGATIVE
Specific Gravity, Urine: 1.022 (ref 1.005–1.030)
pH: 5.5 (ref 5.0–8.0)

## 2011-12-08 LAB — CBC
HCT: 34.1 % — ABNORMAL LOW (ref 39.0–52.0)
MCHC: 34.3 g/dL (ref 30.0–36.0)
MCV: 95 fL (ref 78.0–100.0)
RDW: 23.3 % — ABNORMAL HIGH (ref 11.5–15.5)

## 2011-12-08 LAB — TSH: TSH: 2.072 u[IU]/mL (ref 0.350–4.500)

## 2011-12-08 LAB — CARDIAC PANEL(CRET KIN+CKTOT+MB+TROPI)
CK, MB: 39.9 ng/mL (ref 0.3–4.0)
Relative Index: 2.9 — ABNORMAL HIGH (ref 0.0–2.5)
Troponin I: 14.71 ng/mL (ref <0.30)
Troponin I: 17.28 ng/mL (ref <0.30)

## 2011-12-08 LAB — COMPREHENSIVE METABOLIC PANEL
ALT: 27 U/L (ref 0–53)
Albumin: 3.8 g/dL (ref 3.5–5.2)
Alkaline Phosphatase: 75 U/L (ref 39–117)
Chloride: 101 mEq/L (ref 96–112)
Glucose, Bld: 99 mg/dL (ref 70–99)
Potassium: 3.9 mEq/L (ref 3.5–5.1)
Sodium: 140 mEq/L (ref 135–145)
Total Protein: 6.7 g/dL (ref 6.0–8.3)

## 2011-12-08 LAB — URINE CULTURE

## 2011-12-08 LAB — URINE MICROSCOPIC-ADD ON

## 2011-12-08 MED ORDER — ACETAMINOPHEN 650 MG RE SUPP
650.0000 mg | RECTAL | Status: DC | PRN
  Administered 2011-12-08 – 2011-12-14 (×13): 650 mg via RECTAL
  Filled 2011-12-08 (×15): qty 1

## 2011-12-08 MED ORDER — ONDANSETRON HCL 4 MG PO TABS: 4.0000 mg | ORAL_TABLET | Freq: Four times a day (QID) | ORAL | Status: DC | PRN

## 2011-12-08 MED ORDER — FUROSEMIDE 10 MG/ML IJ SOLN
20.0000 mg | Freq: Once | INTRAMUSCULAR | Status: AC
  Administered 2011-12-08: 20 mg via INTRAVENOUS
  Filled 2011-12-08: qty 2

## 2011-12-08 MED ORDER — CEFTRIAXONE SODIUM 1 G IJ SOLR
1.0000 g | INTRAMUSCULAR | Status: DC
  Administered 2011-12-09 – 2011-12-10 (×2): 1 g via INTRAVENOUS
  Filled 2011-12-08 (×2): qty 10

## 2011-12-08 MED ORDER — CEFTRIAXONE SODIUM 2 G IJ SOLR
2.0000 g | Freq: Once | INTRAMUSCULAR | Status: AC
  Administered 2011-12-08: 2 g via INTRAVENOUS
  Filled 2011-12-08: qty 2

## 2011-12-08 MED ORDER — ONDANSETRON HCL 4 MG/2ML IJ SOLN: 4.0000 mg | Freq: Four times a day (QID) | INTRAMUSCULAR | Status: DC | PRN

## 2011-12-08 MED ORDER — ASPIRIN 300 MG RE SUPP
300.0000 mg | Freq: Every day | RECTAL | Status: DC
  Administered 2011-12-08 – 2011-12-14 (×6): 300 mg via RECTAL
  Filled 2011-12-08 (×9): qty 1

## 2011-12-08 MED ORDER — BIOTENE DRY MOUTH MT LIQD
15.0000 mL | Freq: Two times a day (BID) | OROMUCOSAL | Status: DC
  Administered 2011-12-09 – 2011-12-15 (×12): 15 mL via OROMUCOSAL

## 2011-12-08 MED ORDER — LORAZEPAM 2 MG/ML IJ SOLN
1.0000 mg | Freq: Once | INTRAMUSCULAR | Status: AC
  Administered 2011-12-08: 1 mg via INTRAVENOUS
  Filled 2011-12-08: qty 1

## 2011-12-08 MED ORDER — DEXTROSE-NACL 5-0.45 % IV SOLN
INTRAVENOUS | Status: DC
  Administered 2011-12-08 (×2): via INTRAVENOUS

## 2011-12-08 MED ORDER — SODIUM CHLORIDE 0.9 % IV SOLN
750.0000 mg | Freq: Two times a day (BID) | INTRAVENOUS | Status: DC
  Administered 2011-12-08 – 2011-12-10 (×4): 750 mg via INTRAVENOUS
  Filled 2011-12-08 (×5): qty 750

## 2011-12-08 MED ORDER — SODIUM CHLORIDE 0.9 % IJ SOLN
3.0000 mL | Freq: Two times a day (BID) | INTRAMUSCULAR | Status: DC
  Administered 2011-12-08 – 2011-12-15 (×11): 3 mL via INTRAVENOUS

## 2011-12-08 NOTE — Progress Notes (Signed)
Utilization review completed.  

## 2011-12-08 NOTE — Progress Notes (Signed)
Pt. Arrived to unit via stretcher.  Pt. Settled in bed, wrist restraints on per order.  Pt. Laying in bed, not answering questions and will follow commands at times.  Dr. Mikeal Hawthorne paged to make aware of pts. Arrival to unit per orders.  Will continue to monitor patient.

## 2011-12-08 NOTE — Consult Note (Signed)
CARDIOLOGY CONSULT NOTE   Patient ID: [name] MRN: [id number] DOB/AGE: 10/21/1928 78 y.o.  Admit date: 11/29/2011  Primary Physician   Illene Regulus, MD Primary Cardiologist   SK Reason for Consultation   Elevated cardiac enzymes  BJY:NWGNFA [name] is a 78 y.o. male with a history of Atrial fib but no CAD.  He was admitted 6/1-6/5 with a CVA (hemorrhagic transformation) and then was in rehab till 6/14. Coumadin was discontinued. At discharge, he was able to do ADLs with minimal assistance. He was admitted last pm with AMS, almost unresponsive.  His cardiac enzymes were elevated and cardiology was asked to evaluate him.  His son was with him when it happened. The patient was complaining about the food but had no other complaints. Specifically, he was not complaining of any chest pain or shortness of breath. He was eating and had sudden onset of ?seizure activity?and decreased LOC. His LOC has changed very little since admission. He was responsive to some commands in the emergency room, but does not respond at all to commands now. He does react to stimulus but the responses are not directed. He is moving all extremities.   Past Medical History  Diagnosis Date  . Unspecified psychosis   . Cardiac pacemaker [employer] scientific 2008    St. Jude Insignia I pulse generator model 1194  . Atrial fibrillation   . Pain in limb   . Encounter for long-term (current) use of other medications   . Unspecified essential hypertension   . BPH (benign prostatic hypertrophy)   . Other and unspecified hyperlipidemia   . Other acquired absence of organ   . Unspecified hemorrhoids without mention of complication   . Diverticulosis of colon (without mention of hemorrhage)   . History of vein stripping   . Thrombocytosis 07/09/2011  . Normocytic anemia 07/09/2011  . TIA (transient ischemic attack) 07/09/2011  . Benign neoplasm of colon   . Pacemaker   . Malignant neoplasm of prostate    Past  Surgical History  Procedure Date  . Pacemaker placement 1999, replaced 2008    hx tachy-brady  . Vein stripping laser   . Tonsillectomy and adenoidectomy   . Tonsillectomy     No Known Allergies  I have reviewed the patient's current medications    . antiseptic oral rinse  15 mL Mouth Rinse BID  . LORazepam  1 mg Intravenous Once  . sodium chloride  3 mL Intravenous Q12H      . dextrose 5 % and 0.45% NaCl 50 mL/hr at 12/08/11 0229   ondansetron (ZOFRAN) IV, ondansetron  Medication Sig  acetaminophen (TYLENOL) 650 MG CR tablet Take 650 mg by mouth every 8 (eight) hours as needed.    aspirin 81 MG chewable tablet Chew 81 mg by mouth daily.  atenolol (TENORMIN) 50 MG tablet Take 50 mg by mouth daily.  donepezil (ARICEPT) 10 MG tablet Take 10 mg by mouth at bedtime.  memantine (NAMENDA) 10 MG tablet Take 10 mg by mouth 2 (two) times daily.  methylphenidate (RITALIN) 5 MG tablet Take 5 mg by mouth 2 (two) times daily.     History   Social History  . Marital Status: Married    Spouse Name: N/A    Number of Children: N/A  . Years of Education: 16   Occupational History  . Retired   . President of an [employer]    Social History Main Topics  . Smoking status: Former Smoker -- 0.5  packs/day for 8 years    Types: Cigarettes  . Smokeless tobacco: Not on file  . Alcohol Use: 2.1 oz/week    3 Drinks containing 0.5 oz of alcohol, 1 Cans of beer per week     beer only 3 cans a week  . Drug Use: No  . Sexually Active: No   Other Topics Concern  . Not on file   Social History Narrative   Married.  Lives in [place] with his wife. Has been in a facility since the CVA. Ambulating independently.      Family History  Problem Relation Age of Onset  . Diabetes Neg Hx   . Hypertension Neg Hx   . Coronary artery disease Mother   . Coronary artery disease Sister      ROS: Unobtainable due to the patient's altered mental status.   Physical Exam: Rectal temp  102.4 Blood pressure 135/83, pulse 97, temperature 100.4 F (38 C), temperature source Axillary, resp. rate 20, height 6\' 5"  (1.956 m), weight 176 lb 5.9 oz (80 kg), SpO2 98.00%.  General: Well developed, elderly male in no acute distress Head: Eyes PERRLA, No xanthomas.   Normocephalic and atraumatic, oropharynx without edema or exudate. Dentition poor Lungs: bilateral rales in the bases Heart: Heart slightly irregular rate and rhythm with S1, S2; 2/6 murmur. pulses are 1-2+ lower extrem, 2+ both upper extremities Neck: No carotid bruits. No lymphadenopathy. JVD at 6 cm. Abdomen: Bowel sounds present, abdomen soft and non-tender without masses or hernias noted. Msk:  No spine or cva tenderness. No weakness, no joint deformities or effusions. Extremities: No clubbing or cyanosis. No edema.  Neuro: Responds to stimulus by movement/withdrawal but is not verbal. Possible left facial droop  Skin: No rashes or lesions noted.  Labs:   Lab Results  Component Value Date   WBC 31.8* 12/08/2011   HGB 11.7* 12/08/2011   HCT 34.1* 12/08/2011   MCV 95.0 12/08/2011   PLT 781* 12/08/2011    Basename 12/08/11 0555  INR 1.25    Lab 12/08/11 0555  NA 140  K 3.9  CL 101  CO2 22  BUN 22  CREATININE 1.07  CALCIUM 9.2  PROT 6.7  BILITOT 1.8*  ALKPHOS 75  ALT 27  AST 78*  GLUCOSE 99    Basename 12/08/11 1200  CKTOTAL 1385*  CKMB 39.9*  CKMB Index  2.9*  TROPONINI 14.71*    Basename 12/12/2011 2141  TROPIPOC 0.15*   Pro B Natriuretic peptide (BNP)  Date/Time Value Range Status  11/24/2011  9:23 PM 2101.0* 0 - 450 pg/mL Final   Lab Results  Component Value Date   CHOL 116 11/19/2011   HDL 47 11/19/2011   LDLCALC 57 11/19/2011   TRIG 58 11/19/2011   Echo: 11/19/2011 Study Conclusions - Left ventricle: The cavity size was normal. Systolic function was mildly to moderately reduced. The estimated ejection fraction was in the range of 40% to 45%. Doppler parameters are consistent with abnormal  left ventricular relaxation (grade 1 diastolic dysfunction). - Ventricular septum: Septal motion showed abnormal function and dyssynergy. - Mitral valve: Mildly calcified annulus. Mild to moderate regurgitation. - Left atrium: The atrium was severely dilated. - Right ventricle: Systolic pressure was increased. - Right atrium: The atrium was moderately dilated. - Tricuspid valve: Severe regurgitation. - Pulmonary arteries: PA peak pressure: 43mm Hg (S). - Pericardium, extracardiac: A trivial pericardial effusion was identified posterior to the heart. Impressions: - The right ventricular systolic pressure was increased consistent with  moderate pulmonary hypertension.  ECG: 2011-12-18 19:53:35  ATRIAL FIBRILLATION ~ ? Atrial activity RUN OF VENTRICULAR PREMATURE COMPLEXES ~ sequence of 3 or more V complexes NONSPECIFIC INTRAVENTRICULAR CONDUCTION DELAY ~ QRSd >114mS, not LBBB/RBBB ST DEPRESSION, CONSIDER ISCHEMIA, ANT LEADS ~ ST <-0.67mV, V2-V4 Vent. rate 98 BPM PR interval * ms QRS duration 118 ms QT/QTc 396/479 ms P-R-T axes * 80 26  Similar to ECG: 09-Jul-2011 09:32:51  Electronically paced rhythm ABERRANT COMPLEX ~ small R-R variation, aberrant QRS REPOL ABNRM SUGGESTS ISCHEMIA, DIFFUSE LEADS ~ ST-T neg, ant/lat/inf Vent. rate 60 BPM PR interval * ms QRS duration 92 ms QT/QTc 420/426 ms P-R-T axes * 72 -84  Radiology:  Ct Head Wo Contrast Dec 18, 2011  *RADIOLOGY REPORT*  Clinical Data: Change in mental status.  CT HEAD WITHOUT CONTRAST  Technique:  Contiguous axial images were obtained from the base of the skull through the vertex without contrast.  Comparison: Multiple recent CTs the head, most recently 11/22/2011. The para delete  Findings: That the previously seen right parietal hemorrhage has cleared.  Atrophy and diffuse white matter hypoattenuation is stable.  No acute cortical infarct, hemorrhage, or mass lesion is present.  The ventricles are proportionate to the degree  of atrophy.  No significant extra-axial fluid collection is present. There is some fluid in the left sphenoid sinus.  The paranasal sinuses and mastoid air cells are clear.  The osseous skull is intact.  IMPRESSION:  1.  Stable atrophy and white matter disease. 2.  Interval resolution of right parietal hemorrhage. 3.  No acute intracranial abnormality. 4.  Minimal sinus disease.  Original Report Authenticated By: Jamesetta Orleans. MATTERN, M.D.   Dg Chest Portable 1 View 2011/12/18  *RADIOLOGY REPORT*  Clinical Data: Weakness.  Altered mental status.  PORTABLE CHEST - 1 VIEW  Comparison: Portable chest 11/18/2011.  Findings: The heart is mildly enlarged.  A single pacing lead is in place.  New pulmonary vascular congestion is present.  There is interval increase and a mild interstitial pattern, suggesting mild edema.  No focal airspace disease is evident.  IMPRESSION:  1.  Cardiomegaly with slight increase and a diffuse interstitial pattern suggesting edema and congestive heart failure. 2.  Single lead cardiac pacer.  Original Report Authenticated By: Jamesetta Orleans. MATTERN, M.D.    ASSESSMENT AND PLAN:   The patient was seen today by Dr Riley Kill, the patient evaluated and the data reviewed.  Elevated cardiac enzymes - Hx afib but no CAD. He had no complaints of chest pain prior to his acute neurologic incident. Continue to cycle enzymes. He had a recent echo during his admission for his CVA, M.D. advise on repeating this. His ECG has some changes of ischemia, but in the setting of his altered neurologic status, options are limited. Because of his recent hemorrhagic CVA, he will not be anticoagulated. IV beta blocker can be used if his blood pressure and heart rate will allow. Currently no cardiac catheterization is planned. M.D. advise on further workup. Joana Reamer will be deferred until/unless the patient is taking by mouth's.   Dr. Debby Bud has been contacted and the family was told he is coming by his evening to  see Mr. Dacosta. Mr. Heinecke is currently a full code. I have suggested to the family they discuss with Dr. Debby Bud how aggressive they wish to be in his care as they seemed to be considering a change in his CODE STATUS, per the nursing staff.  Otherwise, per primary M.D. Will order Tylenol suppository for the  fever and asked the RN to notify his primary care physician of the fever in case blood cultures are indicated.  Principal Problem:  *Encephalopathy Active Problems:  Dementia  Atrial fibrillation  Leucocytosis   Signed: Theodore Demark 12/08/2011, 2:10 PM Co-Sign MD  Patient seen and examined and history reviewed with the patient's family.  The event that lead to re admission to the hospital is outlined in the note of Ms. Barrett.  He had sudden onset of shaking.  There are no new changes on CT except for resolution of the hemorrhagic changes associated with his initial hemorrhagic conversion event.  He has been off of anticoagulation.  Did not complain of any chest pain.  Cardiac enzymes are noted to be positive today with Trop of 14.7.  ECG shows atrial fib with background pacing, and the initial tracing shows more impressive precordial ST depression that now is clearly less prominent than on the original tracing.  On exam he is obtunded with an irregular rhythm.  It is hard to get a good lung exam with ronchii noted.  There is some peripheral vasoconstriction noted.  I have reviewed the current situation with his family in some detail.    Impression  1.  Pos cardiac enzymes with borderline ECG changes suggest non STEMI 2.  Changed MS ? Secondary to embolic shower or some other metabolic/toxic event 3.  Persistent atrial fib historically on chronic warfarin anticoagulation.    Rec  Spoke with neuro  --they are using ASA with which I agree.  The rate is controlled and he has backup pacing for now.  His CXR suggests a component of fluid overload but 02 sats are normal.  Will give a  minor dose of furosemide and monitor.  He is not a candidate at this time for evaluation with catheterization  (Which was previously unimpressive in 2004).  Serial enzymes are ordered.  Family is deciding on agressiveness of further treatment.

## 2011-12-08 NOTE — ED Notes (Signed)
Family contact: Sasuke Yaffe [phone] home. [phone]  Cell.

## 2011-12-08 NOTE — Progress Notes (Signed)
I saw elevated troponin results from lab ordered this morning.  I called [facility] and asked them to see patient regarding elevated troponin.  They will see patient today.  Maryln Manuel, MD pager [phone]

## 2011-12-08 NOTE — Progress Notes (Signed)
Patient continues to be febrile and has rhonchi in all lung fields; rapid response called to come and evaluate the need for stepdown or ICU needs. Patient's family is at bedside awaiting for physician to come and speak with them, hospitalist notified and made aware and states that he will be on the unit to evaluate and speak with family.__D. Manson Passey RN

## 2011-12-08 NOTE — Progress Notes (Signed)
INITIAL ADULT NUTRITION ASSESSMENT Date: 12/08/2011   Time: 4:02 PM  Reason for Assessment: Nutrition Risk Report  ASSESSMENT: Male 78 y.o.  Dx: Encephalopathy  Hx:  Past Medical History  Diagnosis Date  . Unspecified psychosis   . Cardiac pacemaker [employer] scientific 2008    St. Jude Insignia I pulse generator model 1194  . Atrial fibrillation   . Pain in limb   . Encounter for long-term (current) use of other medications   . Unspecified essential hypertension   . BPH (benign prostatic hypertrophy)   . Other and unspecified hyperlipidemia   . Other acquired absence of organ   . Unspecified hemorrhoids without mention of complication   . Diverticulosis of colon (without mention of hemorrhage)   . History of vein stripping   . Thrombocytosis 07/09/2011  . Normocytic anemia 07/09/2011  . TIA (transient ischemic attack) 07/09/2011  . Benign neoplasm of colon   . Pacemaker   . Malignant neoplasm of prostate     Related Meds:     . antiseptic oral rinse  15 mL Mouth Rinse BID  . cefTRIAXone (ROCEPHIN)  IV  1 g Intravenous Q24H  . cefTRIAXone (ROCEPHIN)  IV  2 g Intravenous Once  . LORazepam  1 mg Intravenous Once  . sodium chloride  3 mL Intravenous Q12H    Ht: 6\' 5"  (195.6 cm)  Wt: 176 lb 5.9 oz (80 kg)  Ideal Wt: 94.5 kg % Ideal Wt: 85%  Usual Wt: 190 lb -- per office visit 11/17/11 % Usual Wt: 93%  Body mass index is 20.91 kg/(m^2).  Food/Nutrition Related Hx: unintentional weight loss > 10 lbs within the last month, problems chewing or swallowing foods and/or liquids, appears severely malnourished per admission nutrition screen  Labs:  CMP     Component Value Date/Time   NA 140 12/08/2011 0555   NA 142 11/19/2010 1500   K 3.9 12/08/2011 0555   CL 101 12/08/2011 0555   CO2 22 12/08/2011 0555   GLUCOSE 99 12/08/2011 0555   BUN 22 12/08/2011 0555   BUN 26* 11/19/2010 1500   CREATININE 1.07 12/08/2011 0555   CREATININE 0.8 11/19/2010 1500   CALCIUM 9.2 12/08/2011 0555     PROT 6.7 12/08/2011 0555   ALBUMIN 3.8 12/08/2011 0555   AST 78* 12/08/2011 0555   ALT 27 12/08/2011 0555   ALKPHOS 75 12/08/2011 0555   BILITOT 1.8* 12/08/2011 0555   GFRNONAA 63* 12/08/2011 0555   GFRAA 73* 12/08/2011 0555     Intake/Output Summary (Last 24 hours) at 12/08/11 1603 Last data filed at 12/08/11 1256  Gross per 24 hour  Intake    213 ml  Output      0 ml  Net    213 ml    CBG (last 3)   Basename 12/06/2011 2040  GLUCAP 118*    Diet Order: NPO  Supplements/Tube Feeding: N/A  IVF:    dextrose 5 % and 0.45% NaCl Last Rate: 50 mL/hr at 12/08/11 0229    Estimated Nutritional Needs:   Kcal: 1700-1900 Protein: 80-90 gm Fluid: 1.7-1.9 L  Patient with multiple medical problems including recent hemorrhagic stroke about 2 weeks ago who was brought secondary to altered mental status; CT scan she did not show any new strokes; RD attempt to contact patient's wife and son via telephone, however, unsuccessful; low braden score places patient at risk for skin breakdown; per office visit records, patient has lost 14 lb in 3 weeks (7%); patient with visible  muscle loss to temples; question whether patient will need temporary means of EN -- will need to advance slowly as patient at risk for refeeding syndrome -- please see recommendations below  Patient meets criteria for severe malnutrition in the context of chronic illness given visible temples muscle loss, 7% weight loss in < 1 month, suspected suboptimal PO intake PTA  NUTRITION DIAGNOSIS: -Inadequate oral intake (NI-2.1).  Status: Ongoing  RELATED TO: inability to eat  AS EVIDENCE BY: NPO status  MONITORING/EVALUATION(Goals): Goal: Oral intake vs EN to meet >90% of estimated nutrition needs Monitor: PO diet advancement, EN initiation, weight, labs, I/O's  EDUCATION NEEDS: -No education needs identified at this time  INTERVENTION:  If EN warranted, recommend Panda feeding tube placement; initiate Jevity 1.2 formula  at 20 ml/hr and increase by 10 ml every 8 hours to goal rate of 60 ml/hr to provide 1728 kcals, 80 gm protein, 1162 ml of free water  Monitor Mg, Phos, K+ -- replete as needed  RD to follow for nutrition care plan  Dietitian #: [phone]  DOCUMENTATION CODES Per approved criteria  -Severe malnutrition in the context of chronic illness    Alger Memos 12/08/2011, 4:02 PM

## 2011-12-08 NOTE — Progress Notes (Signed)
ANTIBIOTIC CONSULT NOTE - INITIAL  Pharmacy Consult for Vancomycin Indication: AMS, r/o hospital acquired infection  No Known Allergies  Patient Measurements: Height: 6\' 5"  (195.6 cm) Weight: 176 lb 5.9 oz (80 kg) IBW/kg (Calculated) : 89.1   Vital Signs: Temp: 102.9 F (39.4 C) (06/21 1806) Temp src: Rectal (06/21 1806) BP: 114/62 mmHg (06/21 1806) Pulse Rate: 96  (06/21 1806) Intake/Output from previous day: 06/20 0701 - 06/21 0700 In: 213 [I.V.:213] Out: -  Intake/Output from this shift:    Labs:  Basename 12/08/11 0555 02-Jan-2012 2123  WBC 31.8* 29.5*  HGB 11.7* 11.9*  PLT 781* 786*  [facility] -- --  CREATININE 1.07 1.25   Estimated Creatinine Clearance: 61.3 ml/min (by C-G formula based on Cr of 1.07).    Microbiology: Recent Results (from the past 720 hour(s))  URINE CULTURE     Status: Normal   Collection Time   11/18/11  9:31 PM      Component Value Range Status Comment   Specimen Description URINE, RANDOM   Final    Special Requests NONE   Final    Culture  Setup Time [id number]   Final    Colony Count NO GROWTH   Final    Culture NO GROWTH   Final    Report Status 11/20/2011 FINAL   Final   MRSA PCR SCREENING     Status: Normal   Collection Time   11/20/11 12:15 PM      Component Value Range Status Comment   MRSA by PCR NEGATIVE  NEGATIVE Final   URINE CULTURE     Status: Normal   Collection Time   01/02/2012  9:55 PM      Component Value Range Status Comment   Specimen Description URINE, CATHETERIZED   Final    Special Requests NONE   Final    Culture  Setup Time [id number]   Final    Colony Count NO GROWTH   Final    Culture NO GROWTH   Final    Report Status 12/08/2011 FINAL   Final     Medical History: Past Medical History  Diagnosis Date  . Unspecified psychosis   . Cardiac pacemaker [employer] scientific 2008    St. Jude Insignia I pulse generator model 1194  . Atrial fibrillation   . Pain in limb   . Encounter for long-term (current) use  of other medications   . Unspecified essential hypertension   . BPH (benign prostatic hypertrophy)   . Other and unspecified hyperlipidemia   . Other acquired absence of organ   . Unspecified hemorrhoids without mention of complication   . Diverticulosis of colon (without mention of hemorrhage)   . History of vein stripping   . Thrombocytosis 07/09/2011  . Normocytic anemia 07/09/2011  . TIA (transient ischemic attack) 07/09/2011  . Benign neoplasm of colon   . Pacemaker   . Malignant neoplasm of prostate     Medications:  Scheduled:    . antiseptic oral rinse  15 mL Mouth Rinse BID  . aspirin  300 mg Rectal Daily  . cefTRIAXone (ROCEPHIN)  IV  1 g Intravenous Q24H  . cefTRIAXone (ROCEPHIN)  IV  2 g Intravenous Once  . furosemide  20 mg Intravenous Once  . LORazepam  1 mg Intravenous Once  . sodium chloride  3 mL Intravenous Q12H   Assessment: 78 yo M brought in by family with AMS.  Pt was recently discharged from [facility] and inpt rehab after hemorrhagic stroke.  Pt is currently unresponsive.  Goal of Therapy:  Vancomycin trough level 15-20 mcg/ml  Plan:  Begin Vancomycin 750 mg IV Q12h. Will follow cx data, renal function, and clinical progress. Will check Vancomycin trough at steady state.  [employer], Pharm.D., BCPS Clinical Pharmacist Pager [phone] 12/08/2011 8:40 PM

## 2011-12-08 NOTE — Procedures (Signed)
REFERRING PHYSICIAN:  Dr. Laural Benes.  HISTORY:  An 78 year old male with altered mental status.  MEDICATIONS:  Tenormin, Namenda, Aricept, Ritalin.  CONDITIONS OF RECORDING:  This is a 16-channel EEG carried out with the patient in the unresponsive state.  DESCRIPTION:  The background activity is slow and dominated by low-to- moderate voltage polymorphic delta activity.  This activity is persistent during the tracing.  There are some occasional superimposed beta activity that does resemble sleep spindles.  The patient is stimulated during the tracing.  Stimulation occurs both verbally and with painful stimulation.  Despite this, there is no change in the background activity.  Hyperventilation was not performed.  Intermittent photic stimulation failed to elicit any change in the tracing.  IMPRESSION:  This is an abnormal EEG secondary to general background slowing.  Although there are some possible poorly formed sleep transients, the absence of any ability to achieve any activation despite stimulation procedures does make a diffuse disturbance more likely.  This diffuse disturbance is etiologically nonspecific and may include a metabolic encephalopathy among other possibilities.  No epileptiform activity is noted.          ______________________________ Thana Farr, MD    [id number] D:  12/08/2011 17:35:17  T:  12/08/2011 18:40:09  Job #:  [id number]

## 2011-12-08 NOTE — Progress Notes (Signed)
Patient has critical troponin and CK MB labs reported; MD and PA from [facility] notified and orders given. EKG done by nursing tech results called to MD. Patient has 102.1 temperature MD notified and orders given to administered suppository; will continue to monitor patient__________________________________________________D. Manson Passey RN

## 2011-12-08 NOTE — Progress Notes (Signed)
Patient is not medically appropriate to be seen by CSW today. CSW will continue to follow and will attempt to contact family.  Sabino Niemann, MSW, [employer] [phone]

## 2011-12-08 NOTE — Progress Notes (Signed)
Family is with patient, restraints has been removed____________________________________________D. Manson Passey RN

## 2011-12-08 NOTE — Consult Note (Signed)
[facility] CONSULT NOTE     Reason for Consult: AMS    HPI:    [name] is an 78 y.o. male with history of atrial fibrillation/pacemaker on chronic Coumadin therapy as well as documented dementia and TIA 06/2011 admitted 11/18/2011 with left-sided weakness.  CT scan 11/20/2011 showed new 7 x 10 mm hemorrhagic focus right parietal region. Patients coumadin was stopped on that admission due to ICH. At that time patient was noted to have a elevated WBC with no known etiology. Patietn was brought to ED on 12-13-2011 as code stroke due to increased left sided weakness and becoming unresponsive.  Initial temperature on ED arrival was 97.5 however now he has a temperature of 100.4. BP has remained stable. WBC has been 29.5 and 31.8. CXR shows new pulmonary vascular congestion is present. Neurology asked to consult for encephalopathy.   Patient does have history of prostate malignancy.    Past Medical History  Diagnosis Date  . Unspecified psychosis   . Cardiac pacemaker [employer]   . Atrial fibrillation   . Pain in limb   . Encounter for long-term (current) use of other medications   . Unspecified essential hypertension   . BPH (benign prostatic hypertrophy)   . Other and unspecified hyperlipidemia   . Other acquired absence of organ   . Unspecified hemorrhoids without mention of complication   . Diverticulosis of colon (without mention of hemorrhage)   . History of vein stripping   . Thrombocytosis 07/09/2011  . Normocytic anemia 07/09/2011  . TIA (transient ischemic attack) 07/09/2011  . Benign neoplasm of colon   . Pacemaker   . Malignant neoplasm of prostate     Past Surgical History  Procedure Date  . Pacemaker placement 1999, replaced 2008    hx tachy-brady  . Vein stripping laser   . Tonsillectomy and adenoidectomy   . Tonsillectomy     Family History  Problem Relation Age of Onset  . Diabetes Neg Hx   . Hypertension Neg Hx     . Coronary artery disease Mother   . Coronary artery disease Sister     Social History:  reports that he has quit smoking. His smoking use included Cigarettes. He has a 4 pack-year smoking history. He does not have any smokeless tobacco history on file. He reports that he drinks about 2.1 ounces of alcohol per week. He reports that he does not use illicit drugs.  No Known Allergies  Medications:    Prior to Admission:  Prescriptions prior to admission  Medication Sig Dispense Refill  . acetaminophen (TYLENOL) 650 MG CR tablet Take 650 mg by mouth every 8 (eight) hours as needed. For pain or fever      . aspirin 81 MG chewable tablet Chew 81 mg by mouth daily.      Marland Kitchen atenolol (TENORMIN) 50 MG tablet Take 50 mg by mouth daily.      Marland Kitchen donepezil (ARICEPT) 10 MG tablet Take 10 mg by mouth at bedtime.      . memantine (NAMENDA) 10 MG tablet Take 10 mg by mouth 2 (two) times daily.      . methylphenidate (RITALIN) 5 MG tablet Take 5 mg by mouth 2 (two) times daily.       Scheduled:   . antiseptic oral rinse  15 mL Mouth Rinse BID  . LORazepam  1 mg Intravenous Once  . sodium chloride  3 mL Intravenous Q12H    Review of Systems - General ROS: negative for - chills, fatigue, fever or hot flashes Hematological and Lymphatic ROS: negative for - bruising, fatigue, jaundice or pallor Endocrine ROS: negative for - hair pattern changes, hot flashes, mood swings or skin changes Respiratory ROS: negative for - cough, hemoptysis, orthopnea or wheezing Cardiovascular ROS: negative for - dyspnea on exertion, orthopnea, palpitations or shortness of breath Gastrointestinal ROS: negative for - abdominal pain, appetite loss, blood in stools, diarrhea or hematemesis Musculoskeletal ROS: negative for - joint pain, joint stiffness, joint swelling or muscle pain Neurological ROS: positive for - confusion, weakness Dermatological ROS: negative for dry skin, pruritus and rash   Blood pressure 135/83, pulse  97, temperature 100.4 F (38 C), temperature source Axillary, resp. rate 20, height 6\' 5"  (1.956 m), weight 80 kg (176 lb 5.9 oz), SpO2 98.00%.   Neurologic Examination:   Mental Status: Patient is moving spontaneously in bed, eyes closed shows no response to verbal commands.  He will quickly withdrawal from stimuli.  Cranial Nerves: II-No blink to threat III/IV/VI-Extraocular movements intact with doll's.  Pupils reactive bilaterally. V/VII-Smile asymmetric with left NL fold decrease VIII-grossly intact winces to pain  Motor: Withdraws all 4 extremities antigravity to pain briskly, moving bilateral legs spontaneously and purposefully, left arm and leg has increased tone compares to right.  Sensory: Withdraws to pain all 4 extremities.  Deep Tendon Reflexes: 2+ and symmetric with exception of ankle jerk 0/4. Plantars upgoing bilaterally    Lab Results  Component Value Date/Time   CHOL 116 11/19/2011  5:25 AM    Results for orders placed during the hospital encounter of 12/06/2011 (from the past 48 hour(s))  GLUCOSE, CAPILLARY     Status: Abnormal   Collection Time   12/14/2011  8:40 PM      Component Value Range Comment   Glucose-Capillary 118 (*) 70 - 99 mg/dL   CBC     Status: Abnormal   Collection Time   12/06/2011  9:23 PM      Component Value Range Comment   WBC 29.5 (*) 4.0 - 10.5 K/uL    RBC 3.73 (*) 4.22 - 5.81 MIL/uL    Hemoglobin 11.9 (*) 13.0 - 17.0 g/dL    HCT 54.0 (*) 98.1 - 52.0 %    MCV 95.7  78.0 - 100.0 fL    MCH 31.9  26.0 - 34.0 pg    MCHC 33.3  30.0 - 36.0 g/dL    RDW 19.1 (*) 47.8 - 15.5 %    Platelets 786 (*) 150 - 400 K/uL   DIFFERENTIAL     Status: Abnormal   Collection Time   12/04/2011  9:23 PM      Component Value Range Comment   Neutrophils Relative 87 (*) 43 - 77 %    Neutro Abs 26.7 (*) 1.7 - 7.7 K/uL    Lymphocytes Relative 1 (*) 12 - 46 %    Lymphs Abs 0.4 (*) 0.7 - 4.0 K/uL    Monocytes Relative 10  3 - 12 %    Monocytes Absolute 3.2 (*) 0.1 -  1.0 K/uL    Eosinophils Relative 1  0 - 5 %    Eosinophils Absolute 0.2  0.0 - 0.7 K/uL    Basophils Relative 0  0 - 1 %    Basophils Absolute 0.1  0.0 - 0.1 K/uL   BASIC METABOLIC PANEL  Status: Abnormal   Collection Time   12/13/2011  9:23 PM      Component Value Range Comment   Sodium 139  135 - 145 mEq/L    Potassium 3.8  3.5 - 5.1 mEq/L    Chloride 102  96 - 112 mEq/L    CO2 25  19 - 32 mEq/L    Glucose, Bld 130 (*) 70 - 99 mg/dL    BUN 22  6 - 23 mg/dL    Creatinine, Ser 5.40  0.50 - 1.35 mg/dL    Calcium 9.2  8.4 - 98.1 mg/dL    GFR calc non Af Amer 52 (*) >90 mL/min    GFR calc Af Amer 61 (*) >90 mL/min   PROTIME-INR     Status: Abnormal   Collection Time   11/25/2011  9:23 PM      Component Value Range Comment   Prothrombin Time 15.5 (*) 11.6 - 15.2 seconds    INR 1.20  0.00 - 1.49   PRO B NATRIURETIC PEPTIDE     Status: Abnormal   Collection Time   12/06/2011  9:23 PM      Component Value Range Comment   Pro B Natriuretic peptide (BNP) 2101.0 (*) 0 - 450 pg/mL   POCT I-STAT TROPONIN I     Status: Abnormal   Collection Time   11/28/2011  9:41 PM      Component Value Range Comment   Troponin i, poc 0.15 (*) 0.00 - 0.08 ng/mL    Comment NOTIFIED PHYSICIAN      Comment 3            URINALYSIS, ROUTINE W REFLEX MICROSCOPIC     Status: Abnormal   Collection Time   11/19/2011  9:55 PM      Component Value Range Comment   Color, Urine AMBER (*) YELLOW BIOCHEMICALS MAY BE AFFECTED BY COLOR   APPearance CLOUDY (*) CLEAR    Specific Gravity, Urine 1.025  1.005 - 1.030    pH 5.0  5.0 - 8.0    Glucose, UA NEGATIVE  NEGATIVE mg/dL    Hgb urine dipstick NEGATIVE  NEGATIVE    Bilirubin Urine SMALL (*) NEGATIVE    Ketones, ur NEGATIVE  NEGATIVE mg/dL    Protein, ur NEGATIVE  NEGATIVE mg/dL    Urobilinogen, UA 1.0  0.0 - 1.0 mg/dL    Nitrite NEGATIVE  NEGATIVE    Leukocytes, UA NEGATIVE  NEGATIVE MICROSCOPIC NOT DONE ON URINES WITH NEGATIVE PROTEIN, BLOOD, LEUKOCYTES, NITRITE, OR  GLUCOSE <1000 mg/dL.  AMMONIA     Status: Normal   Collection Time   12/14/2011 11:42 PM      Component Value Range Comment   Ammonia 38  11 - 60 umol/L   COMPREHENSIVE METABOLIC PANEL     Status: Abnormal   Collection Time   12/08/11  5:55 AM      Component Value Range Comment   Sodium 140  135 - 145 mEq/L    Potassium 3.9  3.5 - 5.1 mEq/L    Chloride 101  96 - 112 mEq/L    CO2 22  19 - 32 mEq/L    Glucose, Bld 99  70 - 99 mg/dL    BUN 22  6 - 23 mg/dL    Creatinine, Ser 1.91  0.50 - 1.35 mg/dL    Calcium 9.2  8.4 - 47.8 mg/dL    Total Protein 6.7  6.0 - 8.3 g/dL    Albumin 3.8  3.5 - 5.2 g/dL    AST 78 (*) 0 - 37 U/L    ALT 27  0 - 53 U/L    Alkaline Phosphatase 75  39 - 117 U/L    Total Bilirubin 1.8 (*) 0.3 - 1.2 mg/dL    GFR calc non Af Amer 63 (*) >90 mL/min    GFR calc Af Amer 73 (*) >90 mL/min   CBC     Status: Abnormal   Collection Time   12/08/11  5:55 AM      Component Value Range Comment   WBC 31.8 (*) 4.0 - 10.5 K/uL    RBC 3.59 (*) 4.22 - 5.81 MIL/uL    Hemoglobin 11.7 (*) 13.0 - 17.0 g/dL    HCT 09.8 (*) 11.9 - 52.0 %    MCV 95.0  78.0 - 100.0 fL    MCH 32.6  26.0 - 34.0 pg    MCHC 34.3  30.0 - 36.0 g/dL    RDW 14.7 (*) 82.9 - 15.5 %    Platelets 781 (*) 150 - 400 K/uL   PROTIME-INR     Status: Abnormal   Collection Time   12/08/11  5:55 AM      Component Value Range Comment   Prothrombin Time 16.0 (*) 11.6 - 15.2 seconds    INR 1.25  0.00 - 1.49     Ct Head Wo Contrast  December 30, 2011  *RADIOLOGY REPORT*  Clinical Data: Change in mental status.  CT HEAD WITHOUT CONTRAST  Technique:  Contiguous axial images were obtained from the base of the skull through the vertex without contrast.  Comparison: Multiple recent CTs the head, most recently 11/22/2011. The para delete  Findings: That the previously seen right parietal hemorrhage has cleared.  Atrophy and diffuse white matter hypoattenuation is stable.  No acute cortical infarct, hemorrhage, or mass lesion is  present.  The ventricles are proportionate to the degree of atrophy.  No significant extra-axial fluid collection is present. There is some fluid in the left sphenoid sinus.  The paranasal sinuses and mastoid air cells are clear.  The osseous skull is intact.  IMPRESSION:  1.  Stable atrophy and white matter disease. 2.  Interval resolution of right parietal hemorrhage. 3.  No acute intracranial abnormality. 4.  Minimal sinus disease.  Original Report Authenticated By: Jamesetta Orleans. MATTERN, M.D.   Dg Chest Portable 1 View  12/30/11  *RADIOLOGY REPORT*  Clinical Data: Weakness.  Altered mental status.  PORTABLE CHEST - 1 VIEW  Comparison: Portable chest 11/18/2011.  Findings: The heart is mildly enlarged.  A single pacing lead is in place.  New pulmonary vascular congestion is present.  There is interval increase and a mild interstitial pattern, suggesting mild edema.  No focal airspace disease is evident.  IMPRESSION:  1.  Cardiomegaly with slight increase and a diffuse interstitial pattern suggesting edema and congestive heart failure. 2.  Single lead cardiac pacer.  Original Report Authenticated By: Jamesetta Orleans. MATTERN, M.D.     Assessment/Plan:   78 YO male with history of prostate cancer, Afib currently not on coumadin secondary to right posterior parietal/occipital hemorrhagic infarct (11/20/11), leukocytosis of unknown etiology presenting with AMS which started 12-30-2011.  CT head obtained in ED shows interval resolution of right parietal hemorrhage and no acute intracranial abnormalities. Troponin is elevated at 14.71 which is concerning for myocardial injury.  Etiology unclear at this time. Differential for syncope and change in mental status includes metabolic encephalopathy, seizure and multiple distribution  small embolic infarcts.  Recommend: 1) EEG 2) Ammonia, RPR, TSH, magnesium 3) start ASA PR 300 mg daily.  4) 2-D echo to look for possible embolic source 5) Frequent neuro checks 6)  Repeat head CT in 2-3 days.     Felicie Morn PA-C [facility] [phone]  12/08/2011, 12:02 PM  Patient seen and examined. I agree with the above.  Thana Farr, MD [facility] [phone]  12/08/2011  4:56 PM

## 2011-12-08 NOTE — ED Provider Notes (Signed)
I have personally seen and examined the patient.  I have discussed the plan of care with the resident.  I have reviewed the documentation on PMH/FH/Soc. History.  I have reviewed the documentation of the resident and agree.  Doug Sou, MD 12/08/11 [phone]

## 2011-12-08 NOTE — Progress Notes (Signed)
Pt was seen and examined and family updated at bedside.  I called and spoke with pt's attending physician Dr. Debby Bud to notify him of patient's admission.  He recommended proceeding with getting Neurology consult, EEG, MRI study.  Cycle cardiac enzymes.  Pt is full code.  I called and spoke with Adaline Sill, PA-C with neurology service, will try to get the EEG stat and they will see patient.  See additional orders. Maryln Manuel, MD pager [phone].

## 2011-12-08 NOTE — H&P (Signed)
[name] is an 78 y.o. male.   Chief Complaint: Altered mental status HPI: An 78 year old gentleman with multiple medical problems including recent hemorrhagic stroke about 2 weeks ago who was brought in by family secondary to altered mental status. Patient was mostly unresponsive on arrival in the ED. No recent trauma. No new medications. Patient's CT scan she did not show any new strokes. He is unable to communicate or give history on this point although he is arousable with pain which he was unable to do on arrival. His initial lab results were also negative. He is being admitted for observation and monitoring.  Past Medical History  Diagnosis Date  . Malignant neoplasm of prostate   . Unspecified psychosis   . Cardiac pacemaker [employer]   . Atrial fibrillation   . Pain in limb   . Encounter for long-term (current) use of other medications   . Unspecified essential hypertension   . BPH (benign prostatic hypertrophy)   . Other and unspecified hyperlipidemia   . Other acquired absence of organ   . Unspecified hemorrhoids without mention of complication   . Diverticulosis of colon (without mention of hemorrhage)   . History of vein stripping   . Thrombocytosis 07/09/2011  . Normocytic anemia 07/09/2011  . TIA (transient ischemic attack) 07/09/2011  . Benign neoplasm of colon   . Pacemaker     Past Surgical History  Procedure Date  . Pacemaker placement 1999, replaced 2008    hx tachy-brady  . Vein stripping laser   . Tonsillectomy and adenoidectomy     Family History  Problem Relation Age of Onset  . Diabetes Neg Hx   . Hypertension Neg Hx   . Coronary artery disease Mother   . Coronary artery disease Sister    Social History:  reports that he has quit smoking. His smoking use included Cigarettes. He has a 4 pack-year smoking history. He does not have any smokeless tobacco history on file. He reports that he drinks about 1.5 ounces of alcohol per week. He reports  that he does not use illicit drugs.  Allergies: No Known Allergies  Medications Prior to Admission  Medication Sig Dispense Refill  . acetaminophen (TYLENOL) 650 MG CR tablet Take 650 mg by mouth every 8 (eight) hours as needed. For pain or fever      . aspirin 81 MG chewable tablet Chew 81 mg by mouth daily.      Marland Kitchen atenolol (TENORMIN) 50 MG tablet Take 50 mg by mouth daily.      Marland Kitchen donepezil (ARICEPT) 10 MG tablet Take 10 mg by mouth at bedtime.      . memantine (NAMENDA) 10 MG tablet Take 10 mg by mouth 2 (two) times daily.      . methylphenidate (RITALIN) 5 MG tablet Take 5 mg by mouth 2 (two) times daily.        Results for orders placed during the hospital encounter of 01/01/12 (from the past 48 hour(s))  GLUCOSE, CAPILLARY     Status: Abnormal   Collection Time   2012-01-01  8:40 PM      Component Value Range Comment   Glucose-Capillary 118 (*) 70 - 99 mg/dL   CBC     Status: Abnormal   Collection Time   01-01-2012  9:23 PM      Component Value Range Comment   WBC 29.5 (*) 4.0 - 10.5 K/uL    RBC 3.73 (*) 4.22 - 5.81 MIL/uL  Hemoglobin 11.9 (*) 13.0 - 17.0 g/dL    HCT 41.3 (*) 24.4 - 52.0 %    MCV 95.7  78.0 - 100.0 fL    MCH 31.9  26.0 - 34.0 pg    MCHC 33.3  30.0 - 36.0 g/dL    RDW 01.0 (*) 27.2 - 15.5 %    Platelets 786 (*) 150 - 400 K/uL   DIFFERENTIAL     Status: Abnormal   Collection Time   12-13-2011  9:23 PM      Component Value Range Comment   Neutrophils Relative 87 (*) 43 - 77 %    Neutro Abs 26.7 (*) 1.7 - 7.7 K/uL    Lymphocytes Relative 1 (*) 12 - 46 %    Lymphs Abs 0.4 (*) 0.7 - 4.0 K/uL    Monocytes Relative 10  3 - 12 %    Monocytes Absolute 3.2 (*) 0.1 - 1.0 K/uL    Eosinophils Relative 1  0 - 5 %    Eosinophils Absolute 0.2  0.0 - 0.7 K/uL    Basophils Relative 0  0 - 1 %    Basophils Absolute 0.1  0.0 - 0.1 K/uL   BASIC METABOLIC PANEL     Status: Abnormal   Collection Time   December 13, 2011  9:23 PM      Component Value Range Comment   Sodium 139  135 -  145 mEq/L    Potassium 3.8  3.5 - 5.1 mEq/L    Chloride 102  96 - 112 mEq/L    CO2 25  19 - 32 mEq/L    Glucose, Bld 130 (*) 70 - 99 mg/dL    BUN 22  6 - 23 mg/dL    Creatinine, Ser 5.36  0.50 - 1.35 mg/dL    Calcium 9.2  8.4 - 64.4 mg/dL    GFR calc non Af Amer 52 (*) >90 mL/min    GFR calc Af Amer 61 (*) >90 mL/min   PROTIME-INR     Status: Abnormal   Collection Time   2011-12-13  9:23 PM      Component Value Range Comment   Prothrombin Time 15.5 (*) 11.6 - 15.2 seconds    INR 1.20  0.00 - 1.49   PRO B NATRIURETIC PEPTIDE     Status: Abnormal   Collection Time   Dec 13, 2011  9:23 PM      Component Value Range Comment   Pro B Natriuretic peptide (BNP) 2101.0 (*) 0 - 450 pg/mL   POCT I-STAT TROPONIN I     Status: Abnormal   Collection Time   December 13, 2011  9:41 PM      Component Value Range Comment   Troponin i, poc 0.15 (*) 0.00 - 0.08 ng/mL    Comment NOTIFIED PHYSICIAN      Comment 3            URINALYSIS, ROUTINE W REFLEX MICROSCOPIC     Status: Abnormal   Collection Time   12/13/11  9:55 PM      Component Value Range Comment   Color, Urine AMBER (*) YELLOW BIOCHEMICALS MAY BE AFFECTED BY COLOR   APPearance CLOUDY (*) CLEAR    Specific Gravity, Urine 1.025  1.005 - 1.030    pH 5.0  5.0 - 8.0    Glucose, UA NEGATIVE  NEGATIVE mg/dL    Hgb urine dipstick NEGATIVE  NEGATIVE    Bilirubin Urine SMALL (*) NEGATIVE    Ketones, ur NEGATIVE  NEGATIVE mg/dL  Protein, ur NEGATIVE  NEGATIVE mg/dL    Urobilinogen, UA 1.0  0.0 - 1.0 mg/dL    Nitrite NEGATIVE  NEGATIVE    Leukocytes, UA NEGATIVE  NEGATIVE MICROSCOPIC NOT DONE ON URINES WITH NEGATIVE PROTEIN, BLOOD, LEUKOCYTES, NITRITE, OR GLUCOSE <1000 mg/dL.  AMMONIA     Status: Normal   Collection Time   11/25/2011 11:42 PM      Component Value Range Comment   Ammonia 38  11 - 60 umol/L    Ct Head Wo Contrast  11/20/2011  *RADIOLOGY REPORT*  Clinical Data: Change in mental status.  CT HEAD WITHOUT CONTRAST  Technique:  Contiguous axial  images were obtained from the base of the skull through the vertex without contrast.  Comparison: Multiple recent CTs the head, most recently 11/22/2011. The para delete  Findings: That the previously seen right parietal hemorrhage has cleared.  Atrophy and diffuse white matter hypoattenuation is stable.  No acute cortical infarct, hemorrhage, or mass lesion is present.  The ventricles are proportionate to the degree of atrophy.  No significant extra-axial fluid collection is present. There is some fluid in the left sphenoid sinus.  The paranasal sinuses and mastoid air cells are clear.  The osseous skull is intact.  IMPRESSION:  1.  Stable atrophy and white matter disease. 2.  Interval resolution of right parietal hemorrhage. 3.  No acute intracranial abnormality. 4.  Minimal sinus disease.  Original Report Authenticated By: Jamesetta Orleans. MATTERN, M.D.   Dg Chest Portable 1 View  11/22/2011  *RADIOLOGY REPORT*  Clinical Data: Weakness.  Altered mental status.  PORTABLE CHEST - 1 VIEW  Comparison: Portable chest 11/18/2011.  Findings: The heart is mildly enlarged.  A single pacing lead is in place.  New pulmonary vascular congestion is present.  There is interval increase and a mild interstitial pattern, suggesting mild edema.  No focal airspace disease is evident.  IMPRESSION:  1.  Cardiomegaly with slight increase and a diffuse interstitial pattern suggesting edema and congestive heart failure. 2.  Single lead cardiac pacer.  Original Report Authenticated By: Jamesetta Orleans. MATTERN, M.D.    Review of Systems  Unable to perform ROS: mental status change    Blood pressure 140/93, pulse 88, temperature 98.6 F (37 C), temperature source Axillary, resp. rate 22, height 6\' 5"  (1.956 m), weight 80 kg (176 lb 5.9 oz), SpO2 98.00%. Physical Exam  Constitutional: He appears well-developed and well-nourished. He appears lethargic.  HENT:  Head: Normocephalic and atraumatic.  Right Ear: External ear normal.    Left Ear: External ear normal.  Nose: Nose normal.  Mouth/Throat: Oropharynx is clear and moist.  Eyes: Conjunctivae and EOM are normal. Pupils are equal, round, and reactive to light.  Neck: Normal range of motion. Neck supple.  Cardiovascular: Normal rate, regular rhythm, normal heart sounds and intact distal pulses.   Respiratory: Effort normal and breath sounds normal.  GI: Soft. Bowel sounds are normal.  Musculoskeletal: Normal range of motion.  Neurological: He has normal strength. He appears lethargic. No cranial nerve deficit or sensory deficit. He displays a negative Romberg sign.  Skin: Skin is warm and dry.  Psychiatric: His affect is blunt. He is noncommunicative.     Assessment/Plan A 78 year old gentleman admitted with altered mental status probably is encephalopathy. Differentials may include postictal state probably after his hemorrhagic stroke he may have had a seizure that was more proximal. It could be also other chemical causes of altered mental status. Other possibilities could be a new  CVA. Plan #1 encephalopathy: We will admit the patient for further observation frequent neuro checks. Continue managing his chronic medical problems onto his fully awake. We will then get further history from the patient although he has history of dementia or may not be able to communicate clearly what was going on. #2 atrial fibrillation: Patient has been on anticoagulation before his hemorrhagic sister which is off of now. His throat is controlled will continue to monitor him on telemetry. #3 leukocytosis: The causes not entirely clear his UA is negative. Chest x-ray is also negative. We will observe patient closely also. #4 dementia: Again patient is currently obtunded so on the assisted living of his dementia. #5 other chronic medical problems, these are stable.  Shady Bradish,LAWAL 12/08/2011, 5:08 AM

## 2011-12-08 NOTE — Progress Notes (Signed)
Reviewed chart, labs, images, consult notes. Examined pt briefly - unresponsive, non-purposeful movement of the legs, somewhat labored breathing.  Long discussion with the family about end of life care: 1. DNR 2. No mechanical ventilation 3. No ICU care 4. Continue all current therapy and continue diagnostic evaluation 5. PLanted seed in regard to prolonged artificial hydration and nutrition in the event he does not make any progress in recovery.  Face to face time with pt and family 60 min

## 2011-12-09 ENCOUNTER — Inpatient Hospital Stay (HOSPITAL_COMMUNITY): Payer: Medicare Other

## 2011-12-09 DIAGNOSIS — G9340 Encephalopathy, unspecified: Secondary | ICD-10-CM

## 2011-12-09 LAB — CARDIAC PANEL(CRET KIN+CKTOT+MB+TROPI)
Relative Index: 1.1 (ref 0.0–2.5)
Total CK: 2979 U/L — ABNORMAL HIGH (ref 7–232)
Troponin I: 12.46 ng/mL (ref <0.30)

## 2011-12-09 LAB — DIFFERENTIAL
Basophils Relative: 0 % (ref 0–1)
Eosinophils Absolute: 0 10*3/uL (ref 0.0–0.7)
Eosinophils Relative: 0 % (ref 0–5)
Lymphocytes Relative: 2 % — ABNORMAL LOW (ref 12–46)
Monocytes Relative: 15 % — ABNORMAL HIGH (ref 3–12)
Neutro Abs: 30 10*3/uL — ABNORMAL HIGH (ref 1.7–7.7)
Neutrophils Relative %: 83 % — ABNORMAL HIGH (ref 43–77)

## 2011-12-09 LAB — PRO B NATRIURETIC PEPTIDE: Pro B Natriuretic peptide (BNP): 16624 pg/mL — ABNORMAL HIGH (ref 0–450)

## 2011-12-09 LAB — COMPREHENSIVE METABOLIC PANEL
AST: 161 U/L — ABNORMAL HIGH (ref 0–37)
Albumin: 3.7 g/dL (ref 3.5–5.2)
BUN: 24 mg/dL — ABNORMAL HIGH (ref 6–23)
Calcium: 9.2 mg/dL (ref 8.4–10.5)
Chloride: 102 mEq/L (ref 96–112)
Creatinine, Ser: 1.22 mg/dL (ref 0.50–1.35)
Total Bilirubin: 2 mg/dL — ABNORMAL HIGH (ref 0.3–1.2)
Total Protein: 6.6 g/dL (ref 6.0–8.3)

## 2011-12-09 LAB — CBC
MCH: 32.1 pg (ref 26.0–34.0)
MCHC: 33.3 g/dL (ref 30.0–36.0)
MCV: 96.2 fL (ref 78.0–100.0)
Platelets: 752 10*3/uL — ABNORMAL HIGH (ref 150–400)
RBC: 3.65 MIL/uL — ABNORMAL LOW (ref 4.22–5.81)

## 2011-12-09 NOTE — Progress Notes (Signed)
Pt's  Temp 103.2 AX and 104 rectally.  Dr. Clent Ridges notified.  MD will put new order in for elevated temp.  [name], Charity fundraiser.

## 2011-12-09 NOTE — Progress Notes (Signed)
Pt's temp 105.7 Rectally family and Dr. Clent Ridges made aware.  MD instructed to give PRN tylenol.  Will cont. To monitor.

## 2011-12-09 NOTE — Progress Notes (Signed)
Subjective: Mr. Marano remains unresponsive with non-purposeful leg movement. Does not seem to be in pain. Wife is at the bedside  Objective: Lab: Lab Results  Component Value Date   WBC 36.1* 12/09/2011   HGB 11.7* 12/09/2011   HCT 35.1* 12/09/2011   MCV 96.2 12/09/2011   PLT 752* 12/09/2011   BMET    Component Value Date/Time   NA 142 12/09/2011 0328   NA 142 11/19/2010 1500   K 3.9 12/09/2011 0328   CL 102 12/09/2011 0328   CO2 23 12/09/2011 0328   GLUCOSE 142* 12/09/2011 0328   BUN 24* 12/09/2011 0328   BUN 26* 11/19/2010 1500   CREATININE 1.22 12/09/2011 0328   CREATININE 0.8 11/19/2010 1500   CALCIUM 9.2 12/09/2011 0328   GFRNONAA 54* 12/09/2011 0328   GFRAA 62* 12/09/2011 0328    Cardiac Panel (last 3 results)  Basename 12/09/11 0327 12/08/11 2000 12/08/11 1200  CKTOTAL 2979* 1843* 1385*  CKMB 33.3* 20.0* 39.9*  TROPONINI 12.46* 17.28* 14.71*  RELINDX 1.1 1.1 2.9*    Imaging: No new imaging  Physical Exam: Filed Vitals:   12/09/11 0331  BP: 121/81  Pulse: 93  Temp: 99 F (37.2 C)  Resp: 20  warm to the touch Gen'l - elderly white man, unresponsive to verbal stimuli or touch Cor - heart sounds distant but regular PUlm - minimally labored respirations, no rales or wheezes abd - sof Neuro - unresponsive, legs jerks    Assessment/Plan: 1. Neuro - no change in status. EEG w/o seizure activity.  Plan - f/u CT brain in PM  2. Cardiac - CK rising but Troponin I slightly down - picture c/w NSTEMI Plan - continue medical therapy  3. Heme/ID - blood cultures pending. Temperature down. WBC very high. At last admission leukocytosis was thought to be reactive per Heme consultant  Discussed with wife and son. Advised that every day that he doesn't regain consciousness the picture becomes more grim    Illene Regulus 12/09/2011, 10:27 AM

## 2011-12-09 NOTE — Progress Notes (Signed)
CRITICAL VALUE ALERT  Critical value received:  Troponin I: 14  Date of notification:  12/09/11  Time of notification:  0100  Critical value read back:no  Nurse who received alert:  Jaclyn Prime, RN  MD notified (1st page):  Dr. Clent Ridges  Time of first page:  0115  MD notified (2nd page):  Time of second page:  Responding MD:  Dr. Clent Ridges  Time MD responded:  [phone]

## 2011-12-09 NOTE — Progress Notes (Signed)
Pt's Blood cx. on 6/21 showed gram positive cocci and cluster collected on 12/08/11.  Dr. Clent Ridges notified and noted that  pt on vanc and rocephin ABX. Report called in by Alona Bene from lab.  Amanda Pea, Charity fundraiser.

## 2011-12-09 NOTE — Progress Notes (Signed)
Pt's temp 101.7 AX.  Son's request to recheck temp at this time.  Also family  refused echo at this time.  Will cont. To monitor,  Amanda Pea, RN.

## 2011-12-09 NOTE — Research (Signed)
Pt's temp l05.5 Rec. Dr. Clent Ridges made aware and instructed to go ahead and give tylenol pr as ordered.  Pt's son and daughte-in-law made aware.  Pt turned and linen changed as needed.  Amanda Pea, Charity fundraiser.

## 2011-12-10 DIAGNOSIS — R55 Syncope and collapse: Secondary | ICD-10-CM

## 2011-12-10 DIAGNOSIS — I6340 Cerebral infarction due to embolism of unspecified cerebral artery: Secondary | ICD-10-CM

## 2011-12-10 DIAGNOSIS — R4182 Altered mental status, unspecified: Secondary | ICD-10-CM

## 2011-12-10 LAB — DIFFERENTIAL
Band Neutrophils: 0 % (ref 0–10)
Eosinophils Absolute: 0 10*3/uL (ref 0.0–0.7)
Eosinophils Relative: 0 % (ref 0–5)
Metamyelocytes Relative: 0 %
Monocytes Absolute: 2.2 10*3/uL — ABNORMAL HIGH (ref 0.1–1.0)
Monocytes Relative: 4 % (ref 3–12)
Myelocytes: 0 %

## 2011-12-10 LAB — BASIC METABOLIC PANEL
BUN: 46 mg/dL — ABNORMAL HIGH (ref 6–23)
Calcium: 8.8 mg/dL (ref 8.4–10.5)
Creatinine, Ser: 2.34 mg/dL — ABNORMAL HIGH (ref 0.50–1.35)
GFR calc Af Amer: 28 mL/min — ABNORMAL LOW (ref >90)
GFR calc non Af Amer: 24 mL/min — ABNORMAL LOW (ref >90)

## 2011-12-10 LAB — CULTURE, BLOOD (ROUTINE X 2)

## 2011-12-10 LAB — CBC
HCT: 35.7 % — ABNORMAL LOW (ref 39.0–52.0)
MCH: 32.2 pg (ref 26.0–34.0)
MCV: 96.5 fL (ref 78.0–100.0)
RBC: 3.7 MIL/uL — ABNORMAL LOW (ref 4.22–5.81)
RDW: 24.2 % — ABNORMAL HIGH (ref 11.5–15.5)
WBC: 55 10*3/uL (ref 4.0–10.5)

## 2011-12-10 MED ORDER — MORPHINE SULFATE 2 MG/ML IJ SOLN
2.0000 mg | INTRAMUSCULAR | Status: DC
  Administered 2011-12-10 – 2011-12-14 (×25): 2 mg via INTRAVENOUS
  Filled 2011-12-10 (×26): qty 1

## 2011-12-10 NOTE — Progress Notes (Signed)
Subjective: [name] is unresponsive. He is flaccid. Breathing is labored. Has not been responsive since admission.   Objective: Lab: Lab Results  Component Value Date   WBC 55.0* 12/10/2011   HGB 11.9* 12/10/2011   HCT 35.7* 12/10/2011   MCV 96.5 12/10/2011   PLT 613* 12/10/2011       Diff - 92% segs BMET    Component Value Date/Time   NA 146* 12/10/2011 0644   NA 142 11/19/2010 1500   K 4.1 12/10/2011 0644   CL 107 12/10/2011 0644   CO2 23 12/10/2011 0644   GLUCOSE 126* 12/10/2011 0644   BUN 46* 12/10/2011 0644   BUN 26* 11/19/2010 1500   CREATININE 2.34* 12/10/2011 0644   CREATININE 0.8 11/19/2010 1500   CALCIUM 8.8 12/10/2011 0644   GFRNONAA 24* 12/10/2011 0644   GFRAA 28* 12/10/2011 0644    Cardiac Panel (last 3 results)  Basename 12/10/11 0644 12/09/11 0327 12/08/11 2000  CKTOTAL 4000* 2979* 1843*  CKMB 40.3* 33.3* 20.0*  TROPONINI 4.77* 12.46* 17.28*  RELINDX 1.0 1.1 1.1     Imaging: Repeat CT brain June 22 PM: IMPRESSION:  1. Interval evolution of small focal areas of acute infarct in the  left parietal and occipital lobes, and suspected small acute  infarct within the right occipital lobe. This may reflect an  embolic event, given the distribution.  2. No evidence of hemorrhagic transformation; no significant mass  effect or midline shift seen at this time.  3. Mild chronic encephalomalacia at the right frontal lobe,  reflecting remote infarct; diffuse small vessel ischemic  microangiopathy and mild cortical volume loss.  Physical Exam: Filed Vitals:   12/10/11 0513  BP: 92/54  Pulse: 101  Temp: 97.8 F (36.6 C)  Resp: 22  eldelry white man who is unresponsive HEENT- pupils appear to be fixed but ot dilated. Cor - 1+ radial, extremities are cool Pulm - labored breathing with feint rales Abd- soft Neuro - Unresponsive even to deep pain. Flaccid in all extremities.      Assessment/Plan: 1. Neuro - comatose. CT reveals multiple new strokes. His prognosis is  grim. No further interventions  2. Cardiac - enzymes tracking down. BP starting to fall but tis is multi-factorial  3. Heme/ID - 1:2 initial blood cutlures positive for GPC clusters - contaminate? Additional blood cultures pending. Day 3 Rocephin, #2 vanco. WBC very high - raises the concern for hematologic disease.  Met with wife, son, dtr-in-law. Reviewed current status  And very poor prognosis. Mrs. [name] understands his very poor prognosis. At this point will convert to comfort care only. They understand the palliative care unit is closed. Will maintain IV access for medications administration; will d/c antibiotics and maintenance fluids.  [name] 12/10/2011, 10:26 AM

## 2011-12-10 NOTE — Progress Notes (Signed)
DR spoke with family members about pt's comfort care. D/C seq teds. Continue turn pt q 2 hr and oral care. Pt has condom cath.

## 2011-12-10 NOTE — Progress Notes (Signed)
Subjective: Patient remains poorly responsive.  Head CT repeated and shows interval development of focal areas of acute infarct in the left parietal, left occipital and possibly the right occipital lobes.    Objective: Current vital signs: BP 92/54  Pulse 101  Temp 97.8 F (36.6 C) (Oral)  Resp 22  Ht 6\' 5"  (1.956 m)  Wt 75.524 kg (166 lb 8 oz)  BMI 19.74 kg/m2  SpO2 98% Vital signs in last 24 hours: Temp:  [97.8 F (36.6 C)-105.7 F (40.9 C)] 97.8 F (36.6 C) (06/23 0513) Pulse Rate:  [101-125] 101  (06/23 0513) Resp:  [22-44] 22  (06/23 0513) BP: (92-112)/(54-62) 92/54 mmHg (06/23 0513) SpO2:  [90 %-98 %] 98 % (06/23 0513) Weight:  [75.524 kg (166 lb 8 oz)] 75.524 kg (166 lb 8 oz) (06/23 0513)  Intake/Output from previous day: 06/22 0701 - 06/23 0700 In: 2700 [I.V.:2350; IV Piggyback:350] Out: 305 [Urine:305] Intake/Output this shift:   Nutritional status: NPO  Neurologic Exam: Mental Status:  Lying in bed with mouth open breathing spontaneously.  No response to deep sternal rub.  No speech.  Does not follow commands.  Cranial Nerves:  II-No blink to threat  III/IV/VI-Absent Doll's response. Pupils reactive bilaterally.  V/VII-No facial asymmetry noted  VIII-unable to test IX/X-Decreased gag XI-Unable to test XII-Unable to test Motor: Spontaneous movement noted of the right lower extremity.  No other spontaneous movement noted.  Toes cyanotic bilaterally. Sensory: Does not respond to noxious stimuli Deep Tendon Reflexes: 2+ and symmetric with exception of ankle jerk 0/4. Plantars upgoing bilaterally   Lab Results: Results for orders placed during the hospital encounter of 12/01/2011 (from the past 48 hour(s))  CARDIAC PANEL(CRET KIN+CKTOT+MB+TROPI)     Status: Abnormal   Collection Time   12/08/11 12:00 PM      Component Value Range Comment   Total CK 1385 (*) 7 - 232 U/L    CK, MB 39.9 (*) 0.3 - 4.0 ng/mL    Troponin I 14.71 (*) <0.30 ng/mL    Relative Index  2.9 (*) 0.0 - 2.5   CULTURE, BLOOD (ROUTINE X 2)     Status: Normal (Preliminary result)   Collection Time   12/08/11 12:43 PM      Component Value Range Comment   Specimen Description BLOOD LEFT ARM      Special Requests BOTTLES DRAWN AEROBIC AND ANAEROBIC 10CC EACH      Culture  Setup Time [id number]      Culture        Value:        BLOOD CULTURE RECEIVED NO GROWTH TO DATE CULTURE WILL BE HELD FOR 5 DAYS BEFORE ISSUING A FINAL NEGATIVE REPORT   Report Status PENDING     CULTURE, BLOOD (ROUTINE X 2)     Status: Normal (Preliminary result)   Collection Time   12/08/11 12:54 PM      Component Value Range Comment   Specimen Description BLOOD RIGHT HAND      Special Requests BOTTLES DRAWN AEROBIC ONLY 5CC      Culture  Setup Time [id number]      Culture        Value: GRAM POSITIVE COCCI IN CLUSTERS     Note: Gram Stain Report Called to,Read Back By and Verified With: NELLIE BUCK 12/09/11 1441 BY SMITHERSJ   Report Status PENDING     AMMONIA     Status: Abnormal   Collection Time   12/08/11  3:00 PM  Component Value Range Comment   Ammonia 63 (*) 11 - 60 umol/L   MAGNESIUM     Status: Normal   Collection Time   12/08/11  3:17 PM      Component Value Range Comment   Magnesium 2.0  1.5 - 2.5 mg/dL   TSH     Status: Normal   Collection Time   12/08/11  3:17 PM      Component Value Range Comment   TSH 2.072  0.350 - 4.500 uIU/mL   URINALYSIS, ROUTINE W REFLEX MICROSCOPIC     Status: Abnormal   Collection Time   12/08/11  3:22 PM      Component Value Range Comment   Color, Urine AMBER (*) YELLOW BIOCHEMICALS MAY BE AFFECTED BY COLOR   APPearance CLEAR  CLEAR    Specific Gravity, Urine 1.022  1.005 - 1.030    pH 5.5  5.0 - 8.0    Glucose, UA NEGATIVE  NEGATIVE mg/dL    Hgb urine dipstick SMALL (*) NEGATIVE    Bilirubin Urine SMALL (*) NEGATIVE    Ketones, ur NEGATIVE  NEGATIVE mg/dL    Protein, ur NEGATIVE  NEGATIVE mg/dL    Urobilinogen, UA 1.0  0.0 - 1.0 mg/dL    Nitrite  NEGATIVE  NEGATIVE    Leukocytes, UA TRACE (*) NEGATIVE   URINE MICROSCOPIC-ADD ON     Status: Normal   Collection Time   12/08/11  3:22 PM      Component Value Range Comment   Squamous Epithelial / LPF RARE  RARE    WBC, UA 0-2  <3 WBC/hpf    RBC / HPF 0-2  <3 RBC/hpf    Bacteria, UA RARE  RARE    Urine-Other MUCOUS PRESENT     CARDIAC PANEL(CRET KIN+CKTOT+MB+TROPI)     Status: Abnormal   Collection Time   12/08/11  8:00 PM      Component Value Range Comment   Total CK 1843 (*) 7 - 232 U/L    CK, MB 20.0 (*) 0.3 - 4.0 ng/mL CRITICAL VALUE NOTED.  VALUE IS CONSISTENT WITH PREVIOUSLY REPORTED AND CALLED VALUE.   Troponin I 17.28 (*) <0.30 ng/mL    Relative Index 1.1  0.0 - 2.5   SEDIMENTATION RATE     Status: Abnormal   Collection Time   12/08/11  8:00 PM      Component Value Range Comment   Sed Rate 17 (*) 0 - 16 mm/hr   GLUCOSE, CAPILLARY     Status: Abnormal   Collection Time   12/08/11 10:00 PM      Component Value Range Comment   Glucose-Capillary 135 (*) 70 - 99 mg/dL   CARDIAC PANEL(CRET KIN+CKTOT+MB+TROPI)     Status: Abnormal   Collection Time   12/09/11  3:27 AM      Component Value Range Comment   Total CK 2979 (*) 7 - 232 U/L    CK, MB 33.3 (*) 0.3 - 4.0 ng/mL CRITICAL VALUE NOTED.  VALUE IS CONSISTENT WITH PREVIOUSLY REPORTED AND CALLED VALUE.   Troponin I 12.46 (*) <0.30 ng/mL    Relative Index 1.1  0.0 - 2.5   PRO B NATRIURETIC PEPTIDE     Status: Abnormal   Collection Time   12/09/11  3:27 AM      Component Value Range Comment   Pro B Natriuretic peptide (BNP) 16624.0 (*) 0 - 450 pg/mL   CBC     Status: Abnormal   Collection Time  12/09/11  3:28 AM      Component Value Range Comment   WBC 36.1 (*) 4.0 - 10.5 K/uL ADJUSTED FOR NUCLEATED RBC'S   RBC 3.65 (*) 4.22 - 5.81 MIL/uL    Hemoglobin 11.7 (*) 13.0 - 17.0 g/dL    HCT 62.9 (*) 52.8 - 52.0 %    MCV 96.2  78.0 - 100.0 fL    MCH 32.1  26.0 - 34.0 pg    MCHC 33.3  30.0 - 36.0 g/dL    RDW 41.3 (*) 24.4 -  15.5 %    Platelets 752 (*) 150 - 400 K/uL PLATELET COUNT CONFIRMED BY SMEAR  COMPREHENSIVE METABOLIC PANEL     Status: Abnormal   Collection Time   12/09/11  3:28 AM      Component Value Range Comment   Sodium 142  135 - 145 mEq/L    Potassium 3.9  3.5 - 5.1 mEq/L    Chloride 102  96 - 112 mEq/L    CO2 23  19 - 32 mEq/L    Glucose, Bld 142 (*) 70 - 99 mg/dL    BUN 24 (*) 6 - 23 mg/dL    Creatinine, Ser 0.10  0.50 - 1.35 mg/dL    Calcium 9.2  8.4 - 27.2 mg/dL    Total Protein 6.6  6.0 - 8.3 g/dL    Albumin 3.7  3.5 - 5.2 g/dL    AST 536 (*) 0 - 37 U/L    ALT 45  0 - 53 U/L    Alkaline Phosphatase 71  39 - 117 U/L    Total Bilirubin 2.0 (*) 0.3 - 1.2 mg/dL    GFR calc non Af Amer 54 (*) >90 mL/min    GFR calc Af Amer 62 (*) >90 mL/min   DIFFERENTIAL     Status: Abnormal   Collection Time   12/09/11  3:28 AM      Component Value Range Comment   Neutrophils Relative 83 (*) 43 - 77 %    Lymphocytes Relative 2 (*) 12 - 46 %    Monocytes Relative 15 (*) 3 - 12 %    Eosinophils Relative 0  0 - 5 %    Basophils Relative 0  0 - 1 %    Neutro Abs 30.0 (*) 1.7 - 7.7 K/uL    Lymphs Abs 0.7  0.7 - 4.0 K/uL    Monocytes Absolute 5.4 (*) 0.1 - 1.0 K/uL    Eosinophils Absolute 0.0  0.0 - 0.7 K/uL    Basophils Absolute 0.0  0.0 - 0.1 K/uL    WBC Morphology VACUOLATED NEUTROPHILS   TOXIC GRANULATION   Smear Review LARGE PLATELETS PRESENT     CBC     Status: Abnormal   Collection Time   12/10/11  6:44 AM      Component Value Range Comment   WBC 55.0 (*) 4.0 - 10.5 K/uL    RBC 3.70 (*) 4.22 - 5.81 MIL/uL    Hemoglobin 11.9 (*) 13.0 - 17.0 g/dL    HCT 64.4 (*) 03.4 - 52.0 %    MCV 96.5  78.0 - 100.0 fL    MCH 32.2  26.0 - 34.0 pg    MCHC 33.3  30.0 - 36.0 g/dL    RDW 74.2 (*) 59.5 - 15.5 %    Platelets 613 (*) 150 - 400 K/uL   DIFFERENTIAL     Status: Abnormal   Collection Time   12/10/11  6:44 AM  Component Value Range Comment   Neutrophils Relative 92 (*) 43 - 77 %    Lymphocytes  Relative 4 (*) 12 - 46 %    Monocytes Relative 4  3 - 12 %    Eosinophils Relative 0  0 - 5 %    Basophils Relative 0  0 - 1 %    Band Neutrophils 0  0 - 10 %    Metamyelocytes Relative 0      Myelocytes 0      Promyelocytes Absolute 0      Blasts 0      nRBC 0  0 /100 WBC    Neutro Abs 50.6 (*) 1.7 - 7.7 K/uL    Lymphs Abs 2.2  0.7 - 4.0 K/uL    Monocytes Absolute 2.2 (*) 0.1 - 1.0 K/uL    Eosinophils Absolute 0.0  0.0 - 0.7 K/uL    Basophils Absolute 0.0  0.0 - 0.1 K/uL    RBC Morphology RARE NRBCs   MIXED RBC POPULATION   WBC Morphology     TOXIC GRANULATION   Value: MODERATE LEFT SHIFT (>5% METAS AND MYELOS,OCC PRO NOTED)   Smear Review LARGE PLATELETS PRESENT   PLATELET CLUMPS NOTED ON SMEAR, COUNT APPEARS INCREASED  BASIC METABOLIC PANEL     Status: Abnormal   Collection Time   12/10/11  6:44 AM      Component Value Range Comment   Sodium 146 (*) 135 - 145 mEq/L    Potassium 4.1  3.5 - 5.1 mEq/L    Chloride 107  96 - 112 mEq/L    CO2 23  19 - 32 mEq/L    Glucose, Bld 126 (*) 70 - 99 mg/dL    BUN 46 (*) 6 - 23 mg/dL    Creatinine, Ser 1.61 (*) 0.50 - 1.35 mg/dL DELTA CHECK NOTED   Calcium 8.8  8.4 - 10.5 mg/dL    GFR calc non Af Amer 24 (*) >90 mL/min    GFR calc Af Amer 28 (*) >90 mL/min   CARDIAC PANEL(CRET KIN+CKTOT+MB+TROPI)     Status: Abnormal   Collection Time   12/10/11  6:44 AM      Component Value Range Comment   Total CK 4000 (*) 7 - 232 U/L    CK, MB 40.3 (*) 0.3 - 4.0 ng/mL CRITICAL VALUE NOTED.  VALUE IS CONSISTENT WITH PREVIOUSLY REPORTED AND CALLED VALUE.   Troponin I 4.77 (*) <0.30 ng/mL    Relative Index 1.0  0.0 - 2.5     Recent Results (from the past 240 hour(s))  URINE CULTURE     Status: Normal   Collection Time   12/02/2011  9:55 PM      Component Value Range Status Comment   Specimen Description URINE, CATHETERIZED   Final    Special Requests NONE   Final    Culture  Setup Time [id number]   Final    Colony Count NO GROWTH   Final     Culture NO GROWTH   Final    Report Status 12/08/2011 FINAL   Final   CULTURE, BLOOD (ROUTINE X 2)     Status: Normal (Preliminary result)   Collection Time   12/08/11 12:43 PM      Component Value Range Status Comment   Specimen Description BLOOD LEFT ARM   Final    Special Requests BOTTLES DRAWN AEROBIC AND ANAEROBIC [facility]   Final    Culture  Setup Time [id number]  Final    Culture     Final    Value:        BLOOD CULTURE RECEIVED NO GROWTH TO DATE CULTURE WILL BE HELD FOR 5 DAYS BEFORE ISSUING A FINAL NEGATIVE REPORT   Report Status PENDING   Incomplete   CULTURE, BLOOD (ROUTINE X 2)     Status: Normal (Preliminary result)   Collection Time   12/08/11 12:54 PM      Component Value Range Status Comment   Specimen Description BLOOD RIGHT HAND   Final    Special Requests BOTTLES DRAWN AEROBIC ONLY 5CC   Final    Culture  Setup Time [id number]   Final    Culture     Final    Value: GRAM POSITIVE COCCI IN CLUSTERS     Note: Gram Stain Report Called to,Read Back By and Verified With: NELLIE BUCK 12/09/11 1441 BY SMITHERSJ   Report Status PENDING   Incomplete     Lipid Panel No results found for this basename: CHOL,TRIG,HDL,CHOLHDL,VLDL,LDLCALC in the last 72 hours  Studies/Results: Ct Head Wo Contrast  12/09/2011  *RADIOLOGY REPORT*  Clinical Data: Unresponsive; follow-up CT.  Assess for CVA.  CT HEAD WITHOUT CONTRAST  Technique:  Contiguous axial images were obtained from the base of the skull through the vertex without contrast.  Comparison: CT of the head performed 11/30/2011  Findings: Evaluation is mildly suboptimal due to motion artifact.  There has been interval evolution of small focal areas of acute infarct within the left parietal and occipital lobes.  In addition, there is a suspected acute infarct within the right occipital lobe, though this could reflect volume averaging.  This may reflect an embolic event given the distribution.  Mild chronic encephalomalacia is noted  at the right frontal lobe. Diffuse periventricular and subcortical white matter change likely reflects small vessel ischemic microangiopathy.  Prominence of the ventricles and sulci reflects mild cortical volume loss; mild cerebellar atrophy is noted.  The cerebellum is difficult to fully assess due to motion artifact.  There is no evidence of hemorrhagic transformation.  No mass lesions are identified.  The brainstem and fourth ventricle are within normal limits.  The basal ganglia are unremarkable in appearance.  No significant mass effect or midline shift is seen.  There is no evidence of fracture; visualized osseous structures are unremarkable in appearance.  The orbits are within normal limits. The paranasal sinuses and mastoid air cells are well-aerated.  No significant soft tissue abnormalities are seen.  IMPRESSION:  1.  Interval evolution of small focal areas of acute infarct in the left parietal and occipital lobes, and suspected small acute infarct within the right occipital lobe.  This may reflect an embolic event, given the distribution. 2.  No evidence of hemorrhagic transformation; no significant mass effect or midline shift seen at this time. 3.  Mild chronic encephalomalacia at the right frontal lobe, reflecting remote infarct; diffuse small vessel ischemic microangiopathy and mild cortical volume loss.  These results were called by telephone on 12/09/2011  at  10:26 p.m. to  [facility] on [id number], who verbally acknowledged these results.  Original Report Authenticated By: Tonia Ghent, M.D.    Medications:  I have reviewed the patient's current medications. Scheduled:   . antiseptic oral rinse  15 mL Mouth Rinse BID  . aspirin  300 mg Rectal Daily  . cefTRIAXone (ROCEPHIN)  IV  1 g Intravenous Q24H  . sodium chloride  3 mL Intravenous Q12H  . vancomycin  750 mg Intravenous Q12H    Assessment/Plan:  Patient Active Hospital Problem List: Altered Mental Status   Assessment:  Mental  status likely secondary to multiple bilateral infarcts.  2-3 seen on repeat CT imaging but may very well have more that are not able to be appreciated on CT but MRI unable to be performed.  Patient on ASA.     Plan:  Based on patient's current functional status would not recommend an extensive work up at this time.  Patient has a history of atrial fibrillation.  Patient's family has previously had end of life discussions with the patient and are aware of his wishes.      LOS: 3 days   Thana Farr, MD [facility] [phone] 12/10/2011  9:35 AM

## 2011-12-11 DIAGNOSIS — G9340 Encephalopathy, unspecified: Secondary | ICD-10-CM

## 2011-12-11 NOTE — Clinical Documentation Improvement (Signed)
RENAL FAILURE DOCUMENTATION CLARIFICATION QUERY  THIS DOCUMENT IS NOT A PERMANENT PART OF THE MEDICAL RECORD  Please update your documentation within the medical record to reflect your response to this query.                                                                                     12/11/11  Dr. Debby Bud or Associates,  In a better effort to capture your patient's severity of illness, reflect appropriate length of stay and utilization of resources, a review of the patient medical record has revealed the following indicators:  BUN/Cr/GFR 22/1.25/61      Admission 31-Dec-2011 11/1.07/73 24/1.22/54 46/2.34/24      12/10/11     Based on your clinical judgment, please document in the progress notes and discharge summary if a condition below provides greater specificity regarding the patient's renal function this admission:   - Acute Renal Failure   - Acute Kidney Injury   - Other Condition   - Unable to Clinically Determine   In responding to this query please exercise your independent judgment.  The fact that a query is asked, does not imply that any particular answer is desired or expected.   Reviewed: 01/01/12 - query never addressed - ndrgi.  Mathis Dad RN  Thank You,  Jerral Ralph  RN BSN CCDS Certified Clinical Documentation Specialist: Cell   [phone]  Health Information Management [place]   TO RESPOND TO THE THIS QUERY, FOLLOW THE INSTRUCTIONS BELOW:  1. If needed, update documentation for the patient's encounter via the notes activity.  2. Access this query again and click edit on the In [employer].  3. After updating, or not, click F2 to complete all highlighted (required) fields concerning your review. Select "additional documentation in the medical record" OR "no additional documentation provided".  4. Click Sign note button.  5. The deficiency will fall out of your In Basket *Please let us know if you are not able to complete this  workflow by phone or e-mail (listed below).

## 2011-12-11 NOTE — Progress Notes (Signed)
Filed Vitals:   12/11/11 0433  BP: 105/60  Pulse: 112  Temp: 98.9 F (37.2 C)  Resp: 24   [name] is resting comfortably. His wife states he had a peaceful day and night. She and her sons are very comfortable with comfort care only and feel the staff has been very supportive.  Plan - continue comfort care. She is aware that if a tele bed is needed he may get moved.

## 2011-12-11 NOTE — Progress Notes (Signed)
Nutrition Brief Note:  RD pulled to pt from nutrition risk report initially, now being made comfort care only. RD will sign off, if more aggressive measures are desired please consult.   Clarene Duke MARIE Pager [phone]

## 2011-12-12 MED ORDER — SCOPOLAMINE 1 MG/3DAYS TD PT72
1.0000 | MEDICATED_PATCH | TRANSDERMAL | Status: DC
  Administered 2011-12-12 – 2011-12-15 (×2): 1.5 mg via TRANSDERMAL
  Filled 2011-12-12 (×2): qty 1

## 2011-12-12 NOTE — Progress Notes (Signed)
Clinical Social Worker will sign off for now as social work intervention is no longer needed or appropriate. Please consult [place] again if new need arises.  Sabino Niemann, MSW, [employer] [phone]

## 2011-12-12 NOTE — Progress Notes (Signed)
Subjective: Resting quietly, some gurgling. Wife at bedside - she did get some rest.  Objective: Lab: none Imaging: none  Physical Exam: Filed Vitals:   12/12/11 0600  BP: 112/64  Pulse: 110  Temp: 100.4 F (38 C)  Resp: 22  comatose Respirations mildly labored Breath sounds course, no wheeze Cor- distant, regular Neuro - unrepsonsive     Assessment/Plan: Comfort care - will add scopolamine patch. Maintain IV   [name] 12/12/2011, 7:27 AM

## 2011-12-12 NOTE — Progress Notes (Signed)
0000 was notified that room had become available for patient on unit 3000. Patient's spouse was contacted by phone and was notified through voicemail that patient would be moved to 3029. Patient's son named Will was also called and a voicemail was also left to notify him that patient would be moving. Report was called to receiving RN. Patient's son left computer notebook in room so security was called and notified for lock up. However I was told by security that they only had 16 lockers available and all of them were full. So I called receiving RN again and notified her that patient will also have his son's computer notebook with him. A note was also typed out and placed with patient and computer notebook. Patient was transported around 0200.Patient tolerated transport well. He had no signs of respiratory distress his cognition/LOC didn't change from his baseline. He was transported on 3 L of oxygen nasal cannula and he was non-telemetry. Patient remained in the same bed from 4700 and a bed from 3000 was exchanged for it. Receiving RN on 3000 was notified again of patient son's computer notebook and received it.

## 2011-12-12 NOTE — Progress Notes (Signed)
Reported to nurse tech to get another rectal temp for patient. Rectal thermometer on unit was not working so another one had to be ordered. Had to wait for disposable rectal thermometer to arrive for patient. Patient rectal temperature was taken by nurse tech but I was not notified. Around 0000 I was looking at documented vitals and seen elevated rectal temp. When I asked nurse tech about the temp she said "Oh I forgot to tell you" although it was documented that nurse was notified. I never was notified. I reassessed patient's temperature and temperature was elevated. PRN tylenol suppository was given. Patient remained stable with no signs of respiratory distress and no cognitive changes from baseline.

## 2011-12-13 NOTE — Progress Notes (Signed)
Patient is erythematous, unresponsive, labored respirations. Cor- RRR w/ PVCs, No rales.  Continue with supportive care. IV to saline lock.  Spoke with Mrs. Haubner - she is doing ok.

## 2011-12-13 NOTE — Care Management Note (Signed)
    Page 1 of 1   12/18/2011     11:22:45 AM   CARE MANAGEMENT NOTE 12/18/2011  Patient:  [name], [name]   Account Number:  [id number]  Date Initiated:  12/13/2011  Documentation initiated by:  Onnie Boer  Subjective/Objective Assessment:   PT WAS ADMITTED WITH UNRESPONSIVENESS     Action/Plan:   PROGRESSION OF CARE AND DISCHARGE PLANNING   Anticipated DC Date:  12/15/2011   Anticipated DC Plan:  [facility]  In-house referral  Clinical Social Worker      DC Planning Services  CM consult      Choice offered to / List presented to:             Status of service:  Completed, signed off Medicare Important Message given?   (If response is "NO", the following Medicare IM given date fields will be blank) Date Medicare IM given:   Date Additional Medicare IM given:    Discharge Disposition:  EXPIRED  Per UR Regulation:  Reviewed for med. necessity/level of care/duration of stay  If discussed at Long Length of Stay Meetings, dates discussed:    Comments:  12/13/11 Onnie Boer, RN, BSN 1506 PT HAS BEEN UNRESPONSIVE SINCE ADMISSION, FOUND TO HAVE SEVERAL NEW AREAS ON HIS SCANS.  PT IS NOW FULL COMFORT CARE. CURRENTLY IN THE PROCESS OF SEEING IF THE PT WILL MAKE IT TO RESIDENTIAL HOSPICE OR NOT.

## 2011-12-13 NOTE — Progress Notes (Signed)
Patient was given a cool-water bath, in the hopes that it would help his fever come down.  Patient's overall appearance was okay, capillary refill in both hands was good, however his feet are cold, blue, cyanotic in nature.  Scheduled ASA 300mg  was given and rectal temp was taken, it was 103 F.    VS were BP 121/68, HR of 115, Resp of 18-20, some what labored breaths at times, and SpO2 of 96% on 3L oxygen humidified via [place].  Scheduled Morphine is being given and PRN APAP 650mg  suppository as well.  Will continue to monitor patient.  Condom Cath was changed, urine was amber and output is minimal

## 2011-12-13 NOTE — Plan of Care (Signed)
Problem: Inadequate Intake (NI-2.1) Goal: Food and/or nutrient delivery Individualized approach for food/nutrient provision.  Outcome: Not Applicable Date Met:  12/13/11 Brief Nutrition Note  Chart reviewed. Pt now transitioned to comfort care.  No further nutrition interventions warranted at this time.  Please re-consult as needed.   Kendell Bane, RD, LDN, CNSC [phone] pager

## 2011-12-14 LAB — CULTURE, BLOOD (ROUTINE X 2)
Culture  Setup Time: 201306211822
Culture: NO GROWTH

## 2011-12-14 MED ORDER — MORPHINE SULFATE 4 MG/ML IJ SOLN
3.0000 mg | INTRAMUSCULAR | Status: DC
  Administered 2011-12-14 – 2011-12-15 (×6): 3 mg via INTRAVENOUS
  Administered 2011-12-15 (×2): via INTRAVENOUS
  Administered 2011-12-15 – 2011-12-16 (×2): 3 mg via INTRAVENOUS
  Filled 2011-12-14 (×10): qty 1

## 2011-12-14 NOTE — Progress Notes (Signed)
Patient was given a bath with cool water today, in the hopes that it would help his temperature and keep him comfortable, a rectal temp of 102.2, prn APAP 650 suppository was given.  Patients feet today are still cyanotic and today are mottled as well.  Patient seems to be breathing a little harder today, and his respirations lie in the low 30s.    Will continue to keep him and the family comfortable.

## 2011-12-15 NOTE — Progress Notes (Signed)
Subjective: Unresponsive, mildly diaphoretic but appears comfortable  Objective: Lab: no lab  Imaging:no imaging   Physical Exam: Filed Vitals:   12/15/11 0600  BP: 108/65  Pulse: 103  Temp: 98.8 F (37.1 C)  Resp: 26  Damp to touch Unresponsive to sternal rub or nail pressure. Has non-purposeful movement legs. Respirations shallow, rapid. Lungs- rales, decreased BS Cor - IRIR rate controlled     Assessment/Plan: Comfort care - patient admitted in unresponsive state - multiple new infarcts, embolic, on CT scan. He was febrile but no definitive source of infection identified. After much family discussion he was made comfort care only and has had only minimal IV fluids for the past 5 days. His prognosis is terminal - he is not expected to survive to transfer to residential hospice unit - in house palliative care unit is closed.  Plan - continue comfort care only and offer family support during this long vigil.   Illene Regulus 12/15/2011, 9:43 AM

## 2011-12-16 LAB — CULTURE, BLOOD (ROUTINE X 2): Culture: NO GROWTH

## 2011-12-18 ENCOUNTER — Ambulatory Visit: Payer: Self-pay | Admitting: Cardiology

## 2011-12-18 DIAGNOSIS — I4891 Unspecified atrial fibrillation: Secondary | ICD-10-CM

## 2011-12-18 NOTE — Discharge Summary (Signed)
[name]                ACCOUNT NO.:  [id number]  MEDICAL RECORD NO.:  [id number]  LOCATION:  3027                         FACILITY:  [facility]  PHYSICIAN:  Rosalyn Gess. Sophiarose Eades, MD  DATE OF BIRTH:  May 23, 1929  DATE OF ADMISSION:  11/29/2011 DATE OF DISCHARGE:  12/08/2011                              DISCHARGE SUMMARY   DEATH SUMMARY  [name] was admitted June 20 and quietly expired June 29,2013  ADMITTING DIAGNOSES: 1. Acute encephalopathy with loss of consciousness. 2. Fever of unknown origin. 3. Progressive dementia. 4. Atrial fibrillation.  DISCHARGE DIAGNOSES/DEATH SUMMARY: 1. Cerebrovascular accident with multiple infarcts with no regain of     consciousness. 2. Fever of unknown origin with no source of infection identified. 3. Chronic atrial fibrillation. 4. Dementia.  CONSULTANTS:  None.  PROCEDURES:   CT scan of the head without contrast performed June 20 which showed. 1. Stable atrophy and white matter disease. 2. Interval resolution of right parietal hemorrhage. 3. No acute intracranial abnormality. 4. Minimal sinus disease.  Chest x-ray performed June 20, which showed cardiomegaly with slight increase in diffuse interstitial pattern suggesting edema and congestive heart failure.  CT of the head on June 22 which showed interval evolution of small focal areas of acute infarct in the left parietal and occipital lobes, and suspected small acute infarct within the right occipital lobe, thought to represent possible embolic event.  No evidence of hemorrhagic transformation or significant mass effect.  Mild chronic encephalomalacia at the right frontal lobe reflecting remote infarct and diffuse small vessel ischemic angiopathy and mild cortical volume loss.  HISTORY OF PRESENT ILLNESS:  Mr. [name] was a delightful 78 year old gentleman with progressive dementia/memory loss who was recently hospitalized with a hemorrhagic stroke with mild cognitive  deficit.  He was discharged to home without complications.    The patient was at home when he had the sudden onset of loss of consciousness.  Family was present at the time of onset.  The patient was brought to the emergency department.  The patient was unable to communicate and never did regain consciousness.  The patient also was noted to have a significant leukocytosis and fever up to 105.  The patient was initially started on broad-spectrum antibiotics including ceftriaxone and vancomycin.  Blood cultures were negative.  The patient had no foci of infection.  Over the next 48 hours, the patient remained completely unresponsive.  He did have nonpurposeful movement of his legs, but did not respond to deep pain including sternal rub or nail pressure.  The patient's prognosis was very poor, given lack of regain of consciousness after 48 hours.  Prolonged discussions were held with the patient's family including his wife and 2 sons.  With his poor prognosis, a decision was made to convert the patient to comfort care only, which was done on Sunday the 23rd. The patient was kept in a med/surg bed.  He was given morphine every 4 hours to ensure comfort.  The patient never regained consciousness.  He slowly declined and finally expired on Saturday December 16, 2011, at approximately 7:33 a.m.  Cause of death was presumed embolic stroke with a devastating effect as well as  probable infection.  No autopsy was granted.  The patient was released to funeral home.  Family was consoled and had all their questions answered.     Rosalyn Gess Britlee Skolnik, MD     MEN/MEDQ  D:  11/29/2011  T:  11/28/2011  Job:  [id number]

## 2011-12-18 NOTE — Progress Notes (Addendum)
Patient expired at 0710.  Lack of spontaneous respirations and no pulse noted; verified by two nurses.  Unable to attain blood pressure as well. Wife present at bedside at time of death.  Support provided.  Asked if wife needs assistance contacting family members; declined as she notified them herself.  Contacted [facility] at [phone].  Patient is ineligible for donating any organs.  Physician on call for Dr. Debby Bud was Dr. Pete Glatter.  Informed of time of death. Dr. Pete Glatter was going to text Dr. Debby Bud. More family to come to bedside.

## 2011-12-18 NOTE — Progress Notes (Signed)
Death summary - patient after 6 days of supportive comfort care quietly expired this AM, family was present. Met with family, by chance in the cafeteria, and offered condolences. Death certificate will be faxed to the office for completion this AM.  Death summary dicatated # [phone]

## 2011-12-18 DEATH — deceased

## 2011-12-22 ENCOUNTER — Inpatient Hospital Stay: Payer: Medicare Other | Admitting: Physical Medicine & Rehabilitation

## 2012-09-20 ENCOUNTER — Telehealth: Payer: Self-pay | Admitting: Internal Medicine

## 2012-09-20 NOTE — Telephone Encounter (Signed)
Pt deceased as of 01-11-12/mt

## 2013-12-15 IMAGING — CT CT HEAD W/O CM
1 of 2 series · 13 of 30 positions shown, 17 images · non-contrast
Comparison: 11/20/2011 at 4622 hours.

CLINICAL DATA: Follow-up intracranial hemorrhage.

CT HEAD WITHOUT CONTRAST
TECHNIQUE: Contiguous axial images were obtained from the base of
the skull through the vertex without contrast.

[Series 2: brain · axial · 0.47mm/px · z∈[+157,+312]mm · 13 of 36 slices shown, 17 images]
[im 3/36  brain]
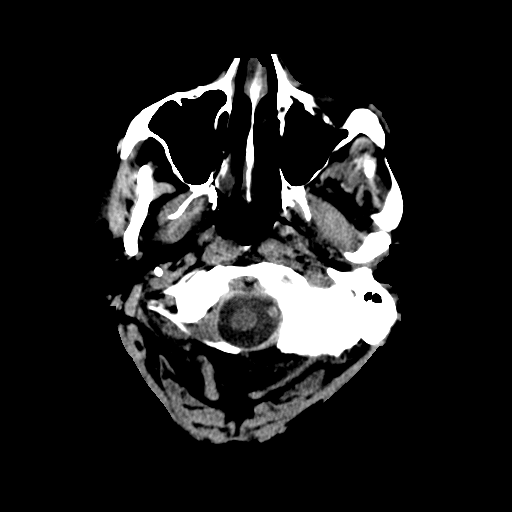
[im 3/36  bone]
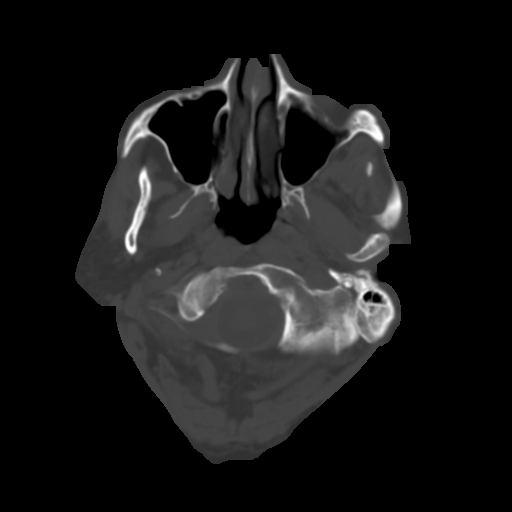
[im 6/36  brain]
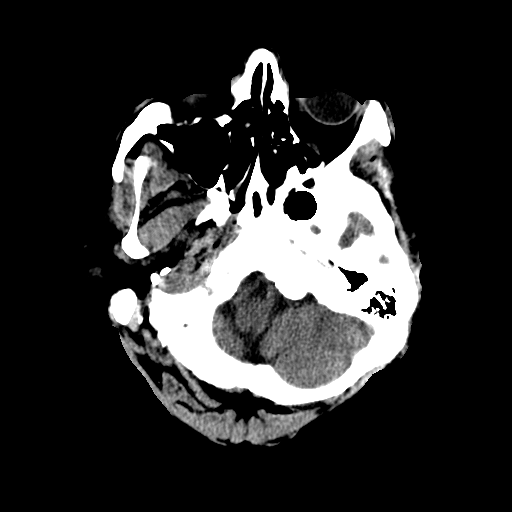
[im 8/36  brain]
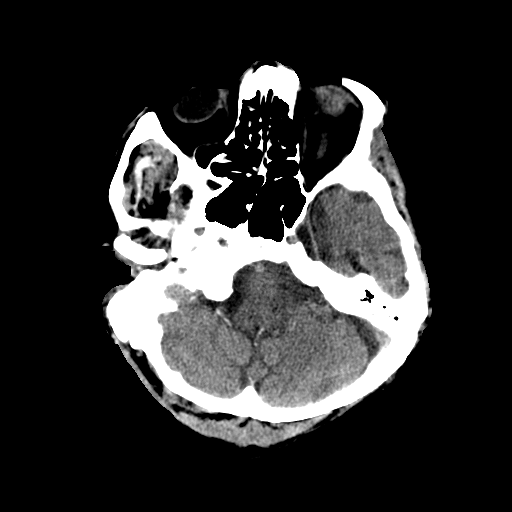
[im 11/36  brain]
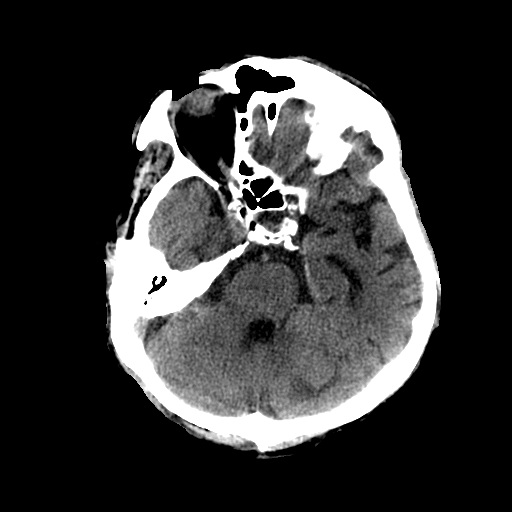
[im 13/36  brain]
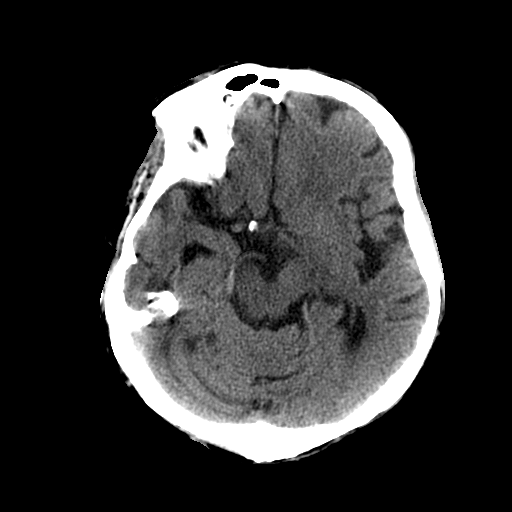
[im 13/36  bone]
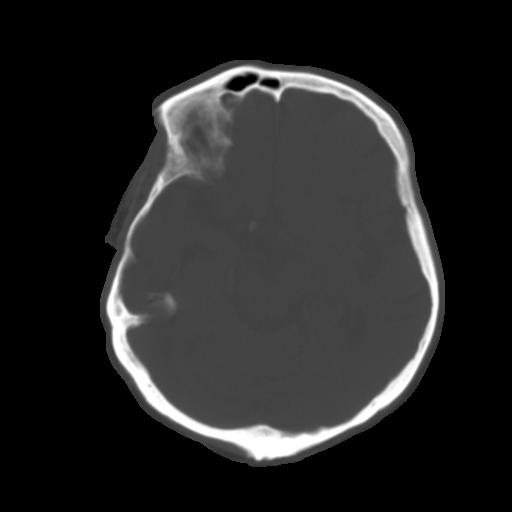
[im 16/36  brain]
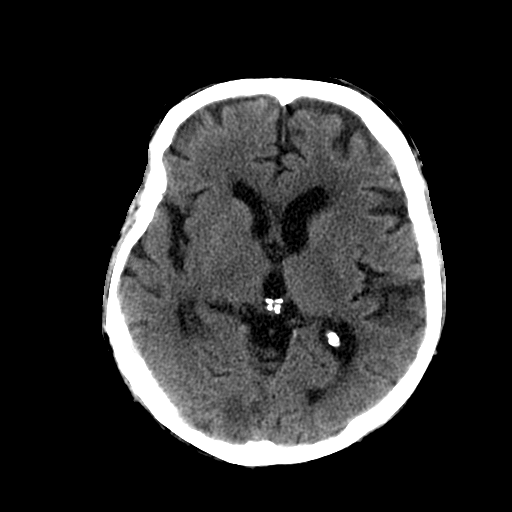
[im 18/36  brain]
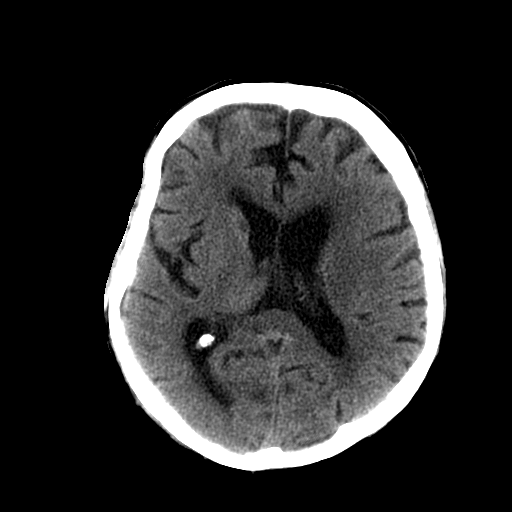
[im 21/36  brain]
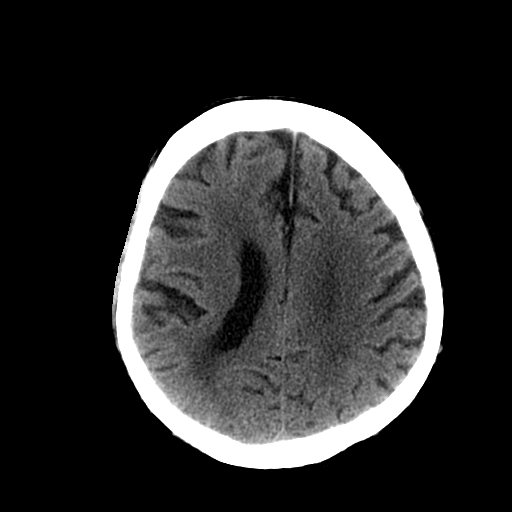
[im 23/36  brain]
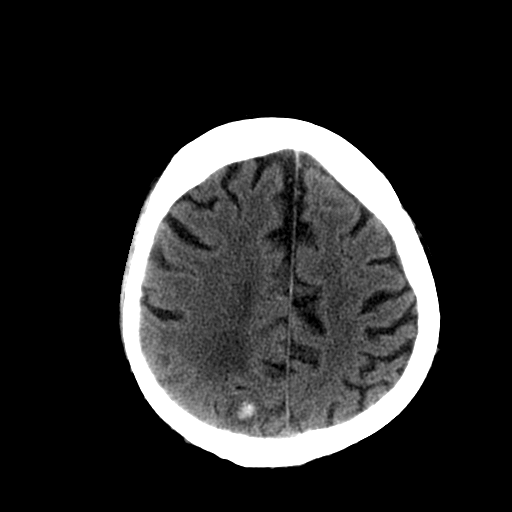
[im 23/36  bone]
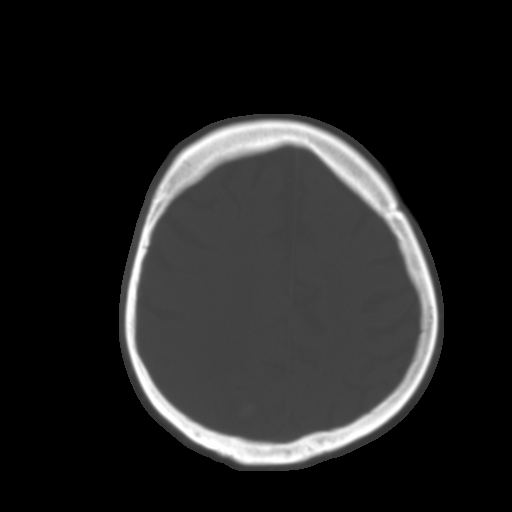
[im 26/36  brain]
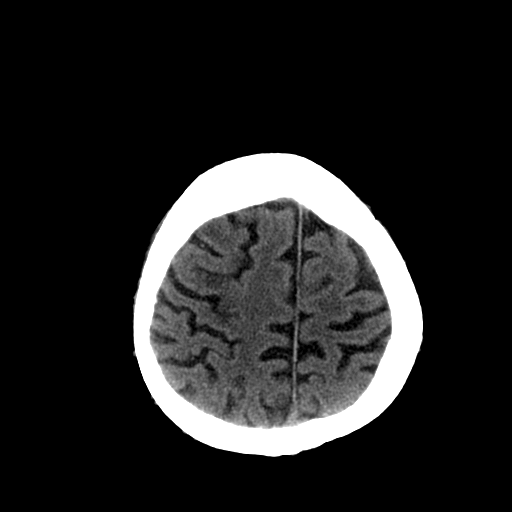
[im 28/36  brain]
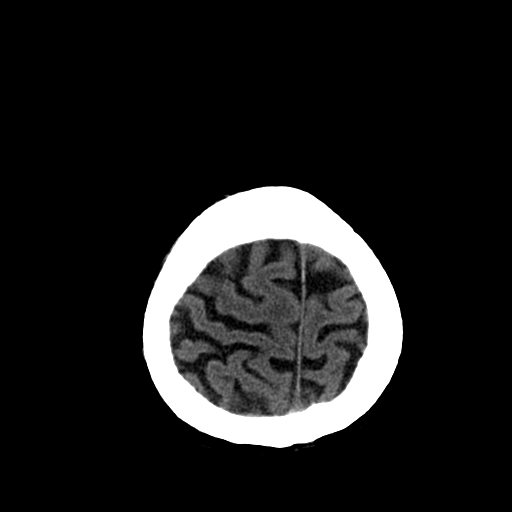
[im 31/36  brain]
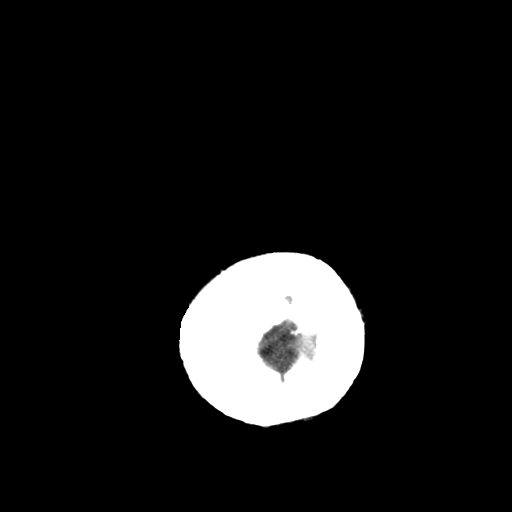
[im 33/36  brain]
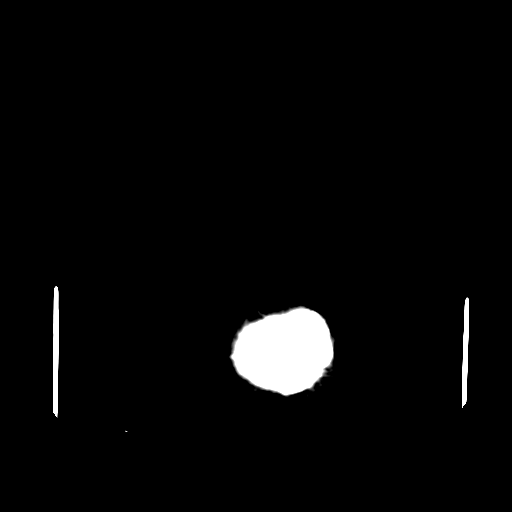
[im 33/36  bone]
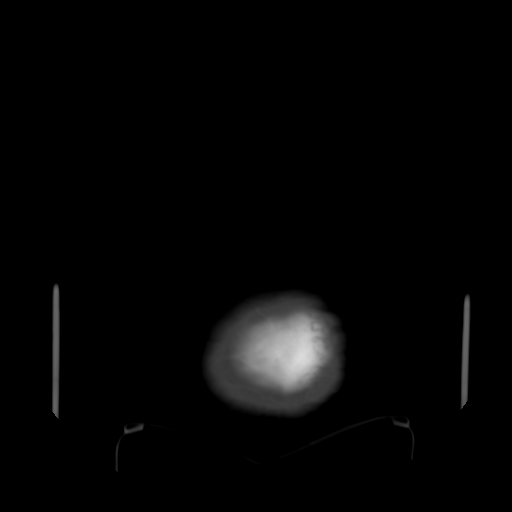

[13 of 30 positions shown; findings below may reference images not displayed]

FINDINGS: Slight interval retraction of the small hematoma in the
posterior parietal region on the right.  Minimal surrounding edema
is stable. Vague area of low-attenuation in the right occipital
lobe could be a cortical infarct.  No hemorrhage.  Stable atrophy
and ventriculomegaly.  No findings for hemispheric infarction or
other areas of hemorrhage.
IMPRESSION: 1.  Slight retraction of small hematoma in the right posterior
parietal region.
2.  Suspect a subtle cortical right occipital infarct without
hemorrhage.

## 2013-12-15 IMAGING — CT CT HEAD W/O CM
1 series · 15 of 30 positions shown, 19 images · non-contrast
Comparison: 11/18/2011

CLINICAL DATA: Progressive neurologic changes with new left facial
weakness in a patient fully anticoagulated. History of atrial
fibrillation.

CT HEAD WITHOUT CONTRAST
TECHNIQUE: Contiguous axial images were obtained from the base of
the skull through the vertex without contrast.

[Series 2: head routine 4.8 h37s · axial · 0.45mm/px · z∈[-139,+18]mm · 15 of 36 slices shown, 19 images]
[im 2/36  brain]
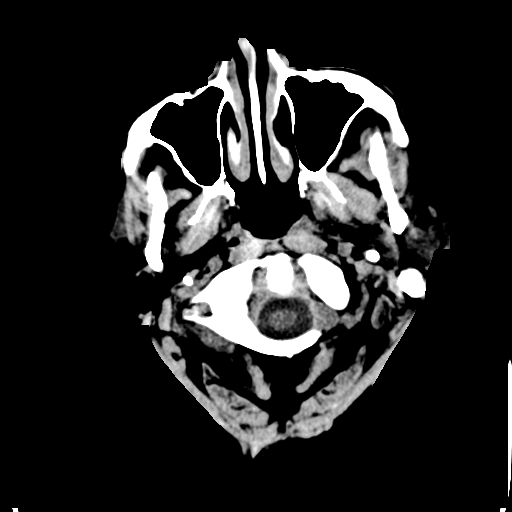
[im 2/36  bone]
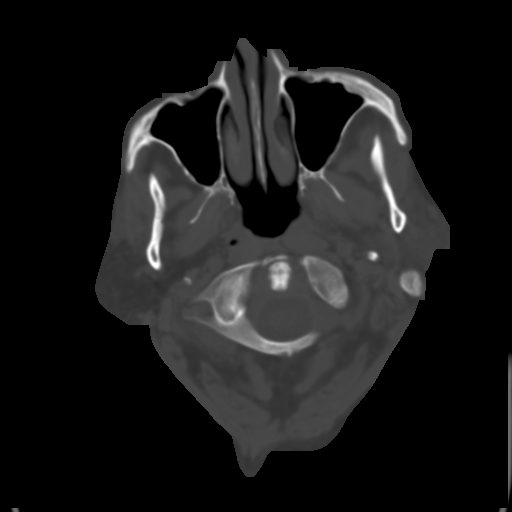
[im 4/36  brain]
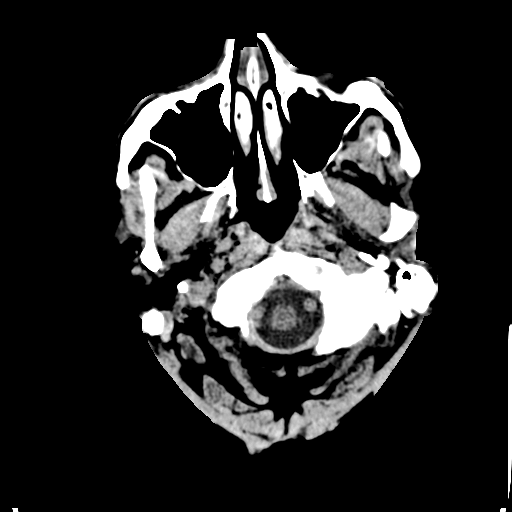
[im 7/36  brain]
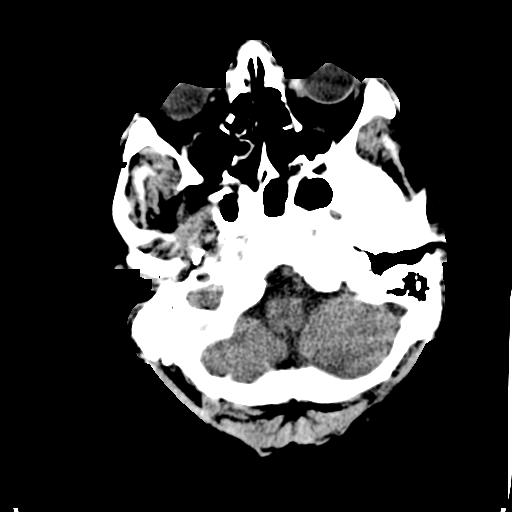
[im 9/36  brain]
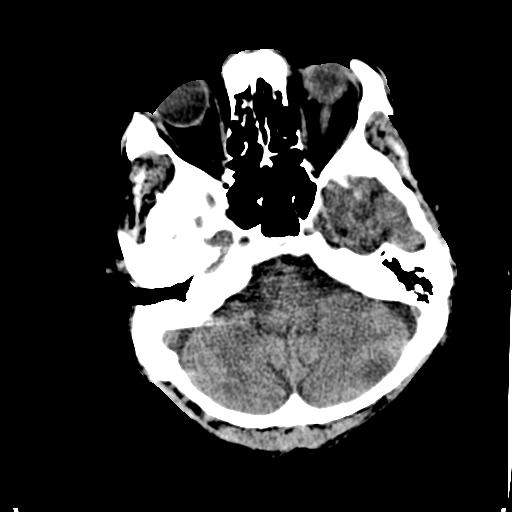
[im 11/36  brain]
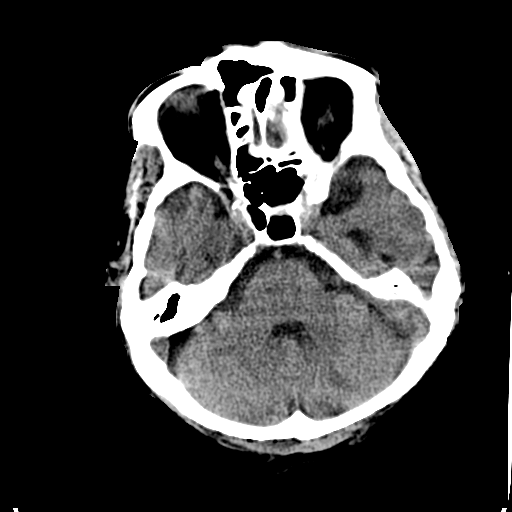
[im 11/36  bone]
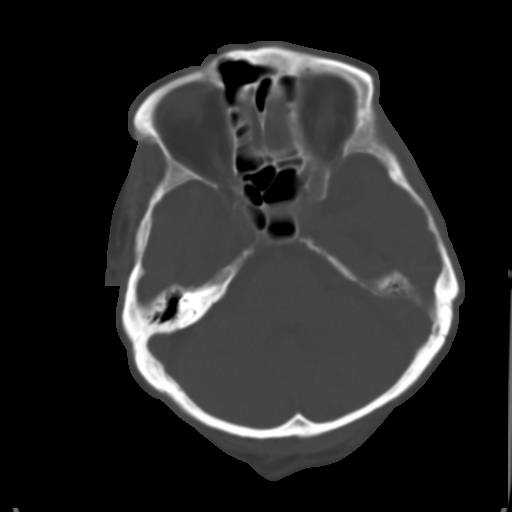
[im 14/36  brain]
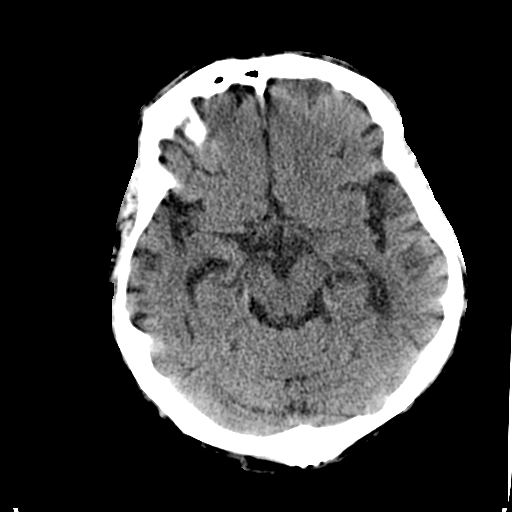
[im 16/36  brain]
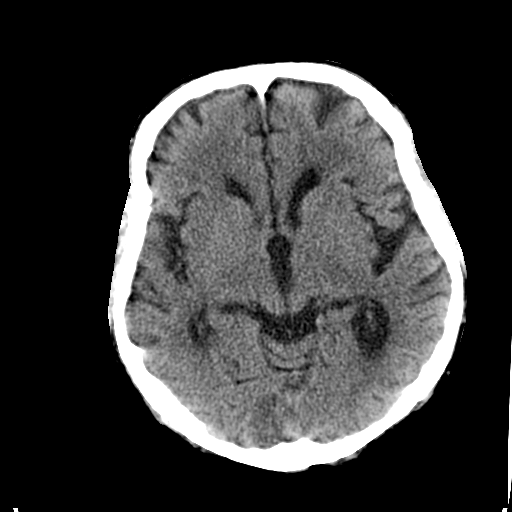
[im 19/36  brain]
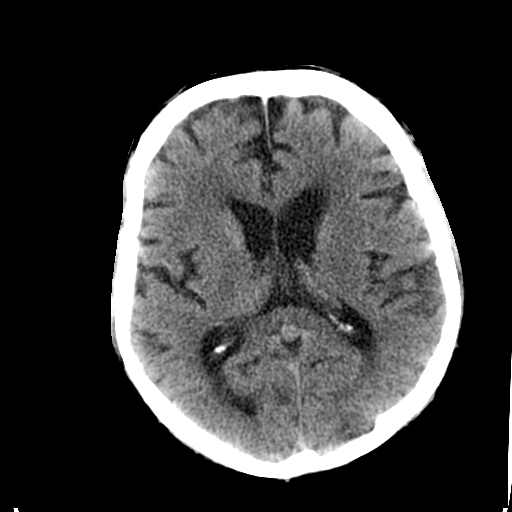
[im 20/36  brain]
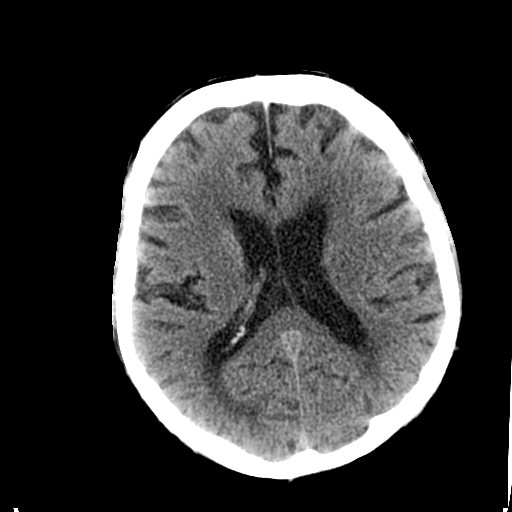
[im 20/36  bone]
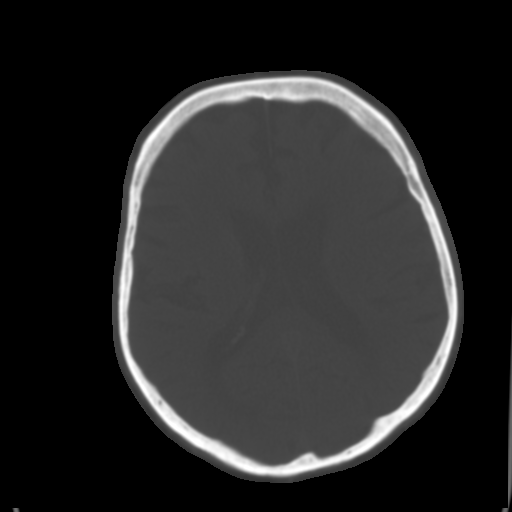
[im 22/36  brain]
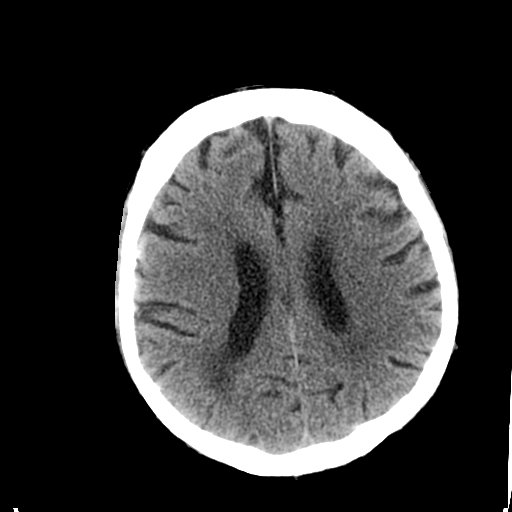
[im 25/36  brain]
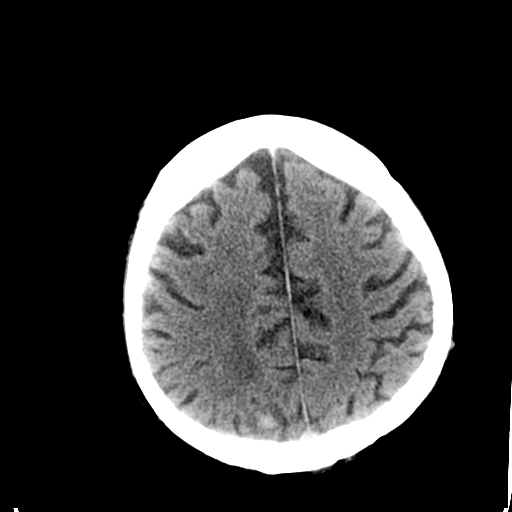
[im 27/36  brain]
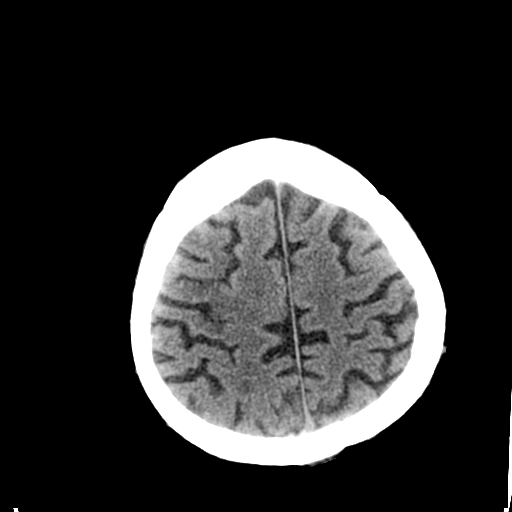
[im 29/36  brain]
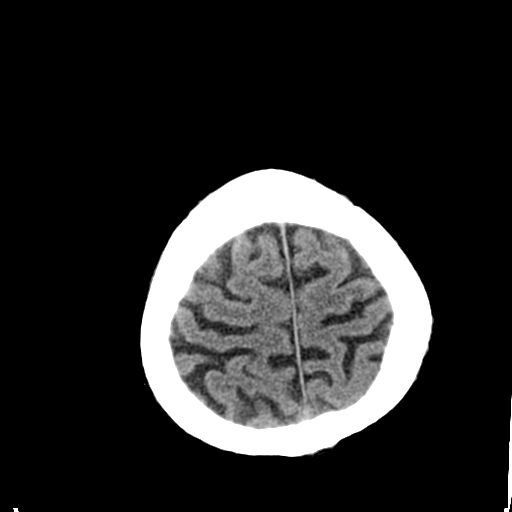
[im 29/36  bone]
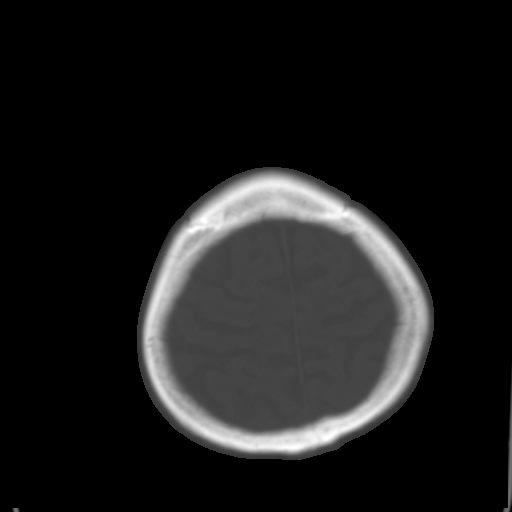
[im 32/36  brain]
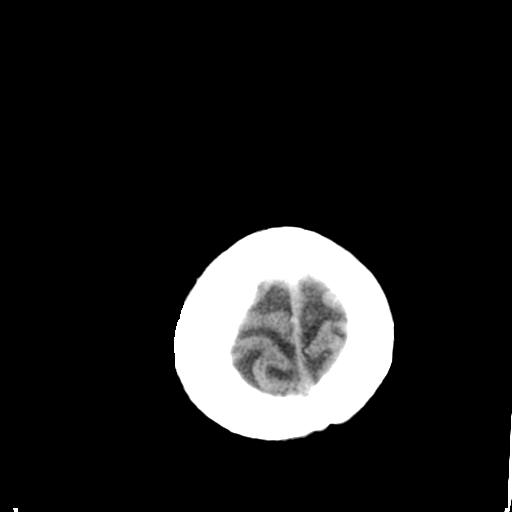
[im 34/36  brain]
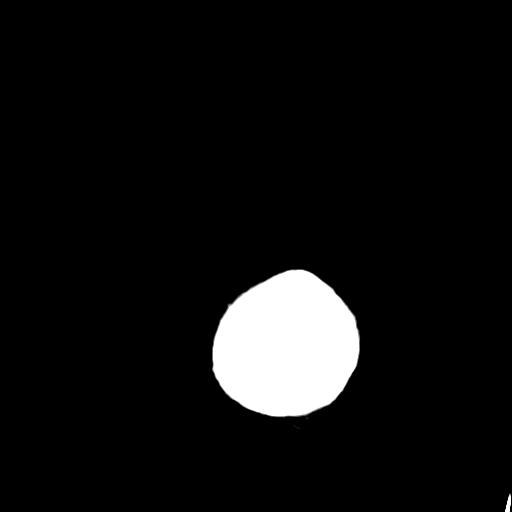

[15 of 30 positions shown; findings below may reference images not displayed]

FINDINGS: There is a new 7 x 10 mm right parietal vertex hemorrhage
with mild surrounding edema not present previously.  This likely
represents an embolic infarction from the patient's atrial
fibrillation.  No other hemorrhages are seen.

There is asymmetric hypodensity (image 16 of series 2) of right
occipital lobe which could represent a second infarction, non
hemorrhagic, versus partial volume averaging of a sulcus.   There
is no hydrocephalus or extra-axial fluid.  Moderate atrophy is
present with chronic microvascular ischemic change.  There are no
CT signs of proximal vascular thrombosis.  Calvarium is intact.
Clear sinuses and mastoids.
IMPRESSION: New 7 x 10 mm hemorrhagic focus right parietal region with mild
surrounding edema was not present previously.  Findings likely
represent a hemorrhagic infarct related to an embolus.

Suspected acute non hemorrhagic right occipital infarct not
visualized previously.  Correlate with history of visual
difficulty.

Critical Value/emergent results were called by telephone at the
time of interpretation on 11/20/2011  at [DATE] a.m.  to  Dr. Gita Sakhe,
who verbally acknowledged these results.

## 2013-12-26 NOTE — Telephone Encounter (Signed)
Close encounter
